# Patient Record
Sex: Female | Born: 1950 | Race: White | Hispanic: No | Marital: Married | Smoking: Former smoker
Health system: Southern US, Community
[De-identification: ages and names within clinical notes are randomized; demographics above are authoritative.]

## PROBLEM LIST (undated history)

## (undated) DIAGNOSIS — I255 Ischemic cardiomyopathy: Secondary | ICD-10-CM

## (undated) DIAGNOSIS — N189 Chronic kidney disease, unspecified: Secondary | ICD-10-CM

## (undated) DIAGNOSIS — E119 Type 2 diabetes mellitus without complications: Secondary | ICD-10-CM

## (undated) DIAGNOSIS — I10 Essential (primary) hypertension: Secondary | ICD-10-CM

## (undated) DIAGNOSIS — R0602 Shortness of breath: Secondary | ICD-10-CM

## (undated) DIAGNOSIS — J449 Chronic obstructive pulmonary disease, unspecified: Secondary | ICD-10-CM

## (undated) DIAGNOSIS — Z8489 Family history of other specified conditions: Secondary | ICD-10-CM

## (undated) DIAGNOSIS — D649 Anemia, unspecified: Secondary | ICD-10-CM

## (undated) DIAGNOSIS — I509 Heart failure, unspecified: Secondary | ICD-10-CM

## (undated) HISTORY — DX: Chronic obstructive pulmonary disease, unspecified: J44.9

## (undated) HISTORY — DX: Ischemic cardiomyopathy: I25.5

## (undated) HISTORY — DX: Chronic kidney disease, unspecified: N18.9

## (undated) HISTORY — PX: INCONTINENCE SURGERY: SHX676

## (undated) HISTORY — DX: Essential (primary) hypertension: I10

## (undated) HISTORY — DX: Type 2 diabetes mellitus without complications: E11.9

## (undated) HISTORY — DX: Anemia, unspecified: D64.9

---

## 2003-07-23 ENCOUNTER — Ambulatory Visit (HOSPITAL_COMMUNITY): Admission: RE | Admit: 2003-07-23 | Discharge: 2003-07-23 | Payer: Self-pay | Admitting: Orthopaedic Surgery

## 2003-07-23 ENCOUNTER — Ambulatory Visit (HOSPITAL_BASED_OUTPATIENT_CLINIC_OR_DEPARTMENT_OTHER): Admission: RE | Admit: 2003-07-23 | Discharge: 2003-07-23 | Payer: Self-pay | Admitting: Orthopaedic Surgery

## 2005-08-30 HISTORY — PX: KNEE CARTILAGE SURGERY: SHX688

## 2005-08-30 HISTORY — PX: VIDEO ASSISTED THORACOSCOPY (VATS)/ LYMPH NODE SAMPLING: SHX6170

## 2006-07-26 ENCOUNTER — Inpatient Hospital Stay (HOSPITAL_COMMUNITY): Admission: RE | Admit: 2006-07-26 | Discharge: 2006-07-30 | Payer: Self-pay | Admitting: Thoracic Surgery

## 2006-07-26 ENCOUNTER — Encounter (INDEPENDENT_AMBULATORY_CARE_PROVIDER_SITE_OTHER): Payer: Self-pay | Admitting: *Deleted

## 2006-08-10 ENCOUNTER — Encounter: Admission: RE | Admit: 2006-08-10 | Discharge: 2006-08-10 | Payer: Self-pay | Admitting: Thoracic Surgery

## 2006-08-14 ENCOUNTER — Inpatient Hospital Stay (HOSPITAL_COMMUNITY)
Admission: EM | Admit: 2006-08-14 | Discharge: 2006-08-19 | Payer: Self-pay | Admitting: Thoracic Surgery (Cardiothoracic Vascular Surgery)

## 2006-08-24 ENCOUNTER — Encounter: Admission: RE | Admit: 2006-08-24 | Discharge: 2006-08-24 | Payer: Self-pay | Admitting: Thoracic Surgery

## 2006-09-07 ENCOUNTER — Encounter: Admission: RE | Admit: 2006-09-07 | Discharge: 2006-09-07 | Payer: Self-pay | Admitting: Thoracic Surgery

## 2006-09-12 ENCOUNTER — Ambulatory Visit: Payer: Self-pay | Admitting: Pulmonary Disease

## 2006-09-28 ENCOUNTER — Encounter: Admission: RE | Admit: 2006-09-28 | Discharge: 2006-09-28 | Payer: Self-pay | Admitting: Thoracic Surgery

## 2006-11-23 ENCOUNTER — Ambulatory Visit: Payer: Self-pay | Admitting: Thoracic Surgery

## 2006-11-23 ENCOUNTER — Encounter: Admission: RE | Admit: 2006-11-23 | Discharge: 2006-11-23 | Payer: Self-pay | Admitting: Thoracic Surgery

## 2007-01-16 ENCOUNTER — Ambulatory Visit: Payer: Self-pay | Admitting: Pulmonary Disease

## 2007-01-27 ENCOUNTER — Ambulatory Visit: Payer: Self-pay | Admitting: Pulmonary Disease

## 2007-08-19 IMAGING — CR DG CHEST 2V
2 series · 2 of 2 positions shown · non-contrast
Comparison: 08/24/06.

CLINICAL DATA: Left sided chest pain.  Status post thoracoscopy.  
 PA AND LATERAL CHEST:

[w chest pa]
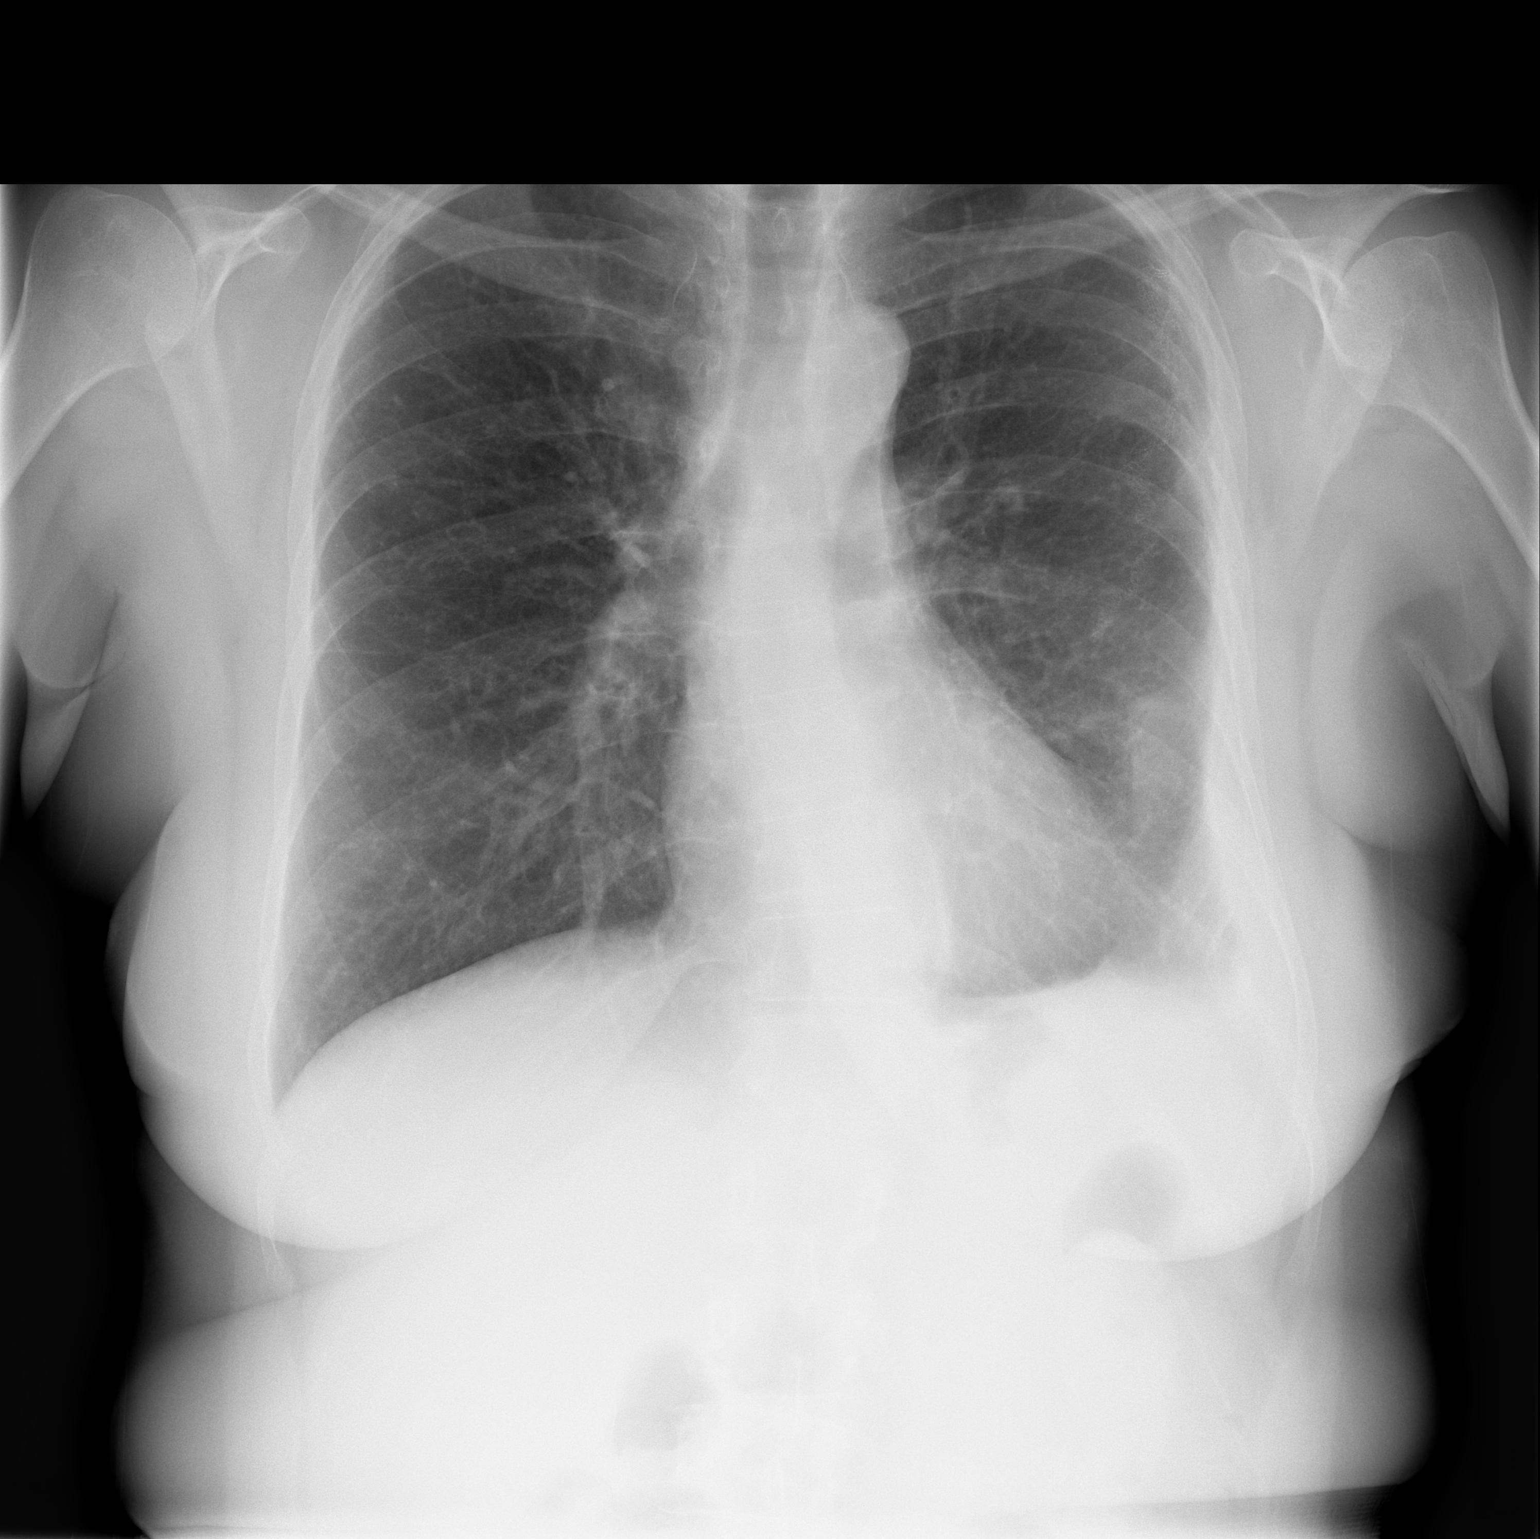

[w chest lat]
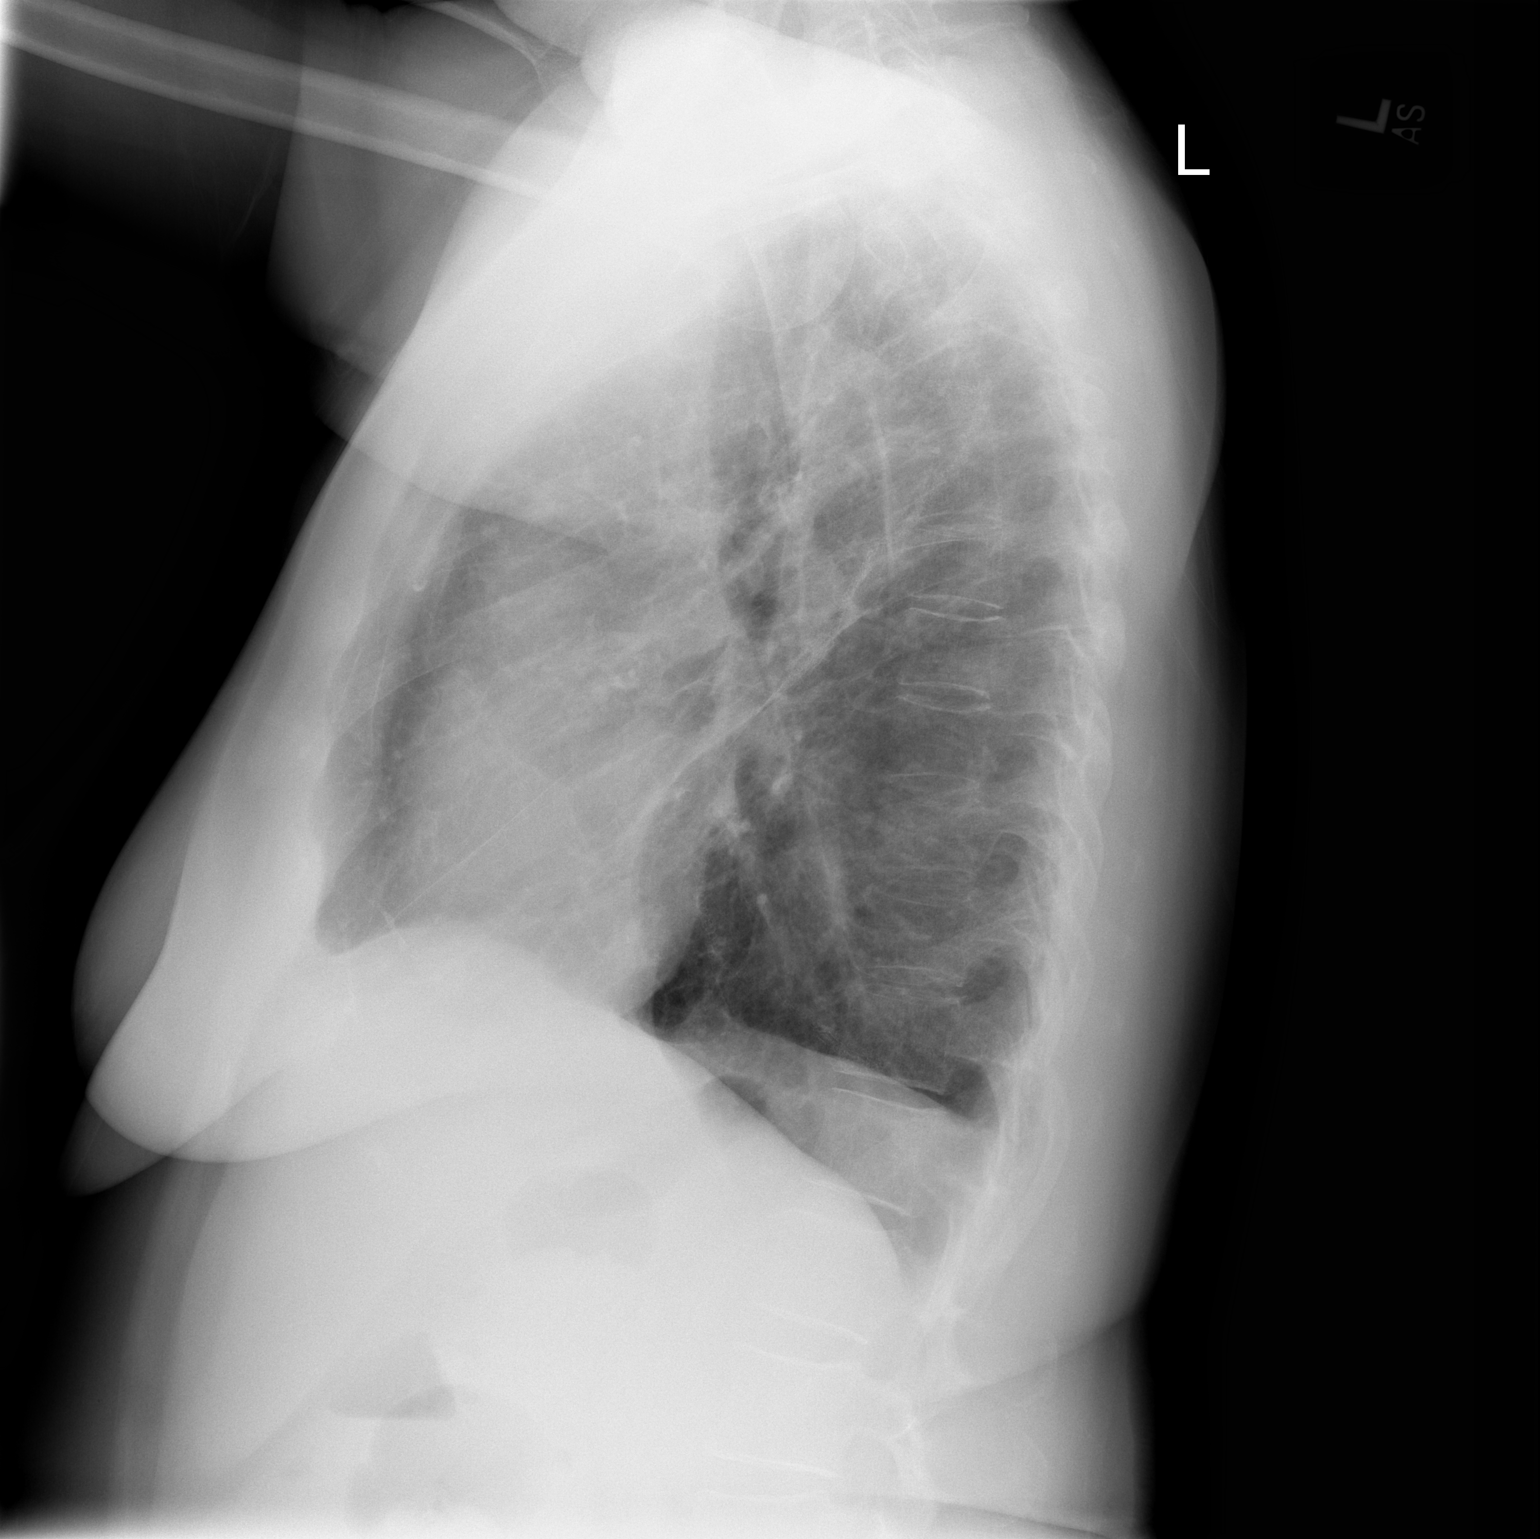

[2 of 2 positions shown; findings below may reference images not displayed]

Heart size and vascularity are normal.  There is decreased pleural thickening at the left lung base laterally.  The interstitial markings have improved.  Multiple surgical staples are seen in the left hemithorax from the previous lung surgery.  No pneumothorax or focal bony abnormality.  The right lung is clear.
IMPRESSION: No acute abnormality.  Decreased pleural thickening at the left lung base laterally.

## 2007-09-09 IMAGING — CR DG CHEST 2V
2 series · 2 of 2 positions shown · non-contrast
Comparison: 09/07/06

CLINICAL DATA: The patient has undergone thoracoscopy.
 CHEST - 2 VIEW:

[w chest pa]
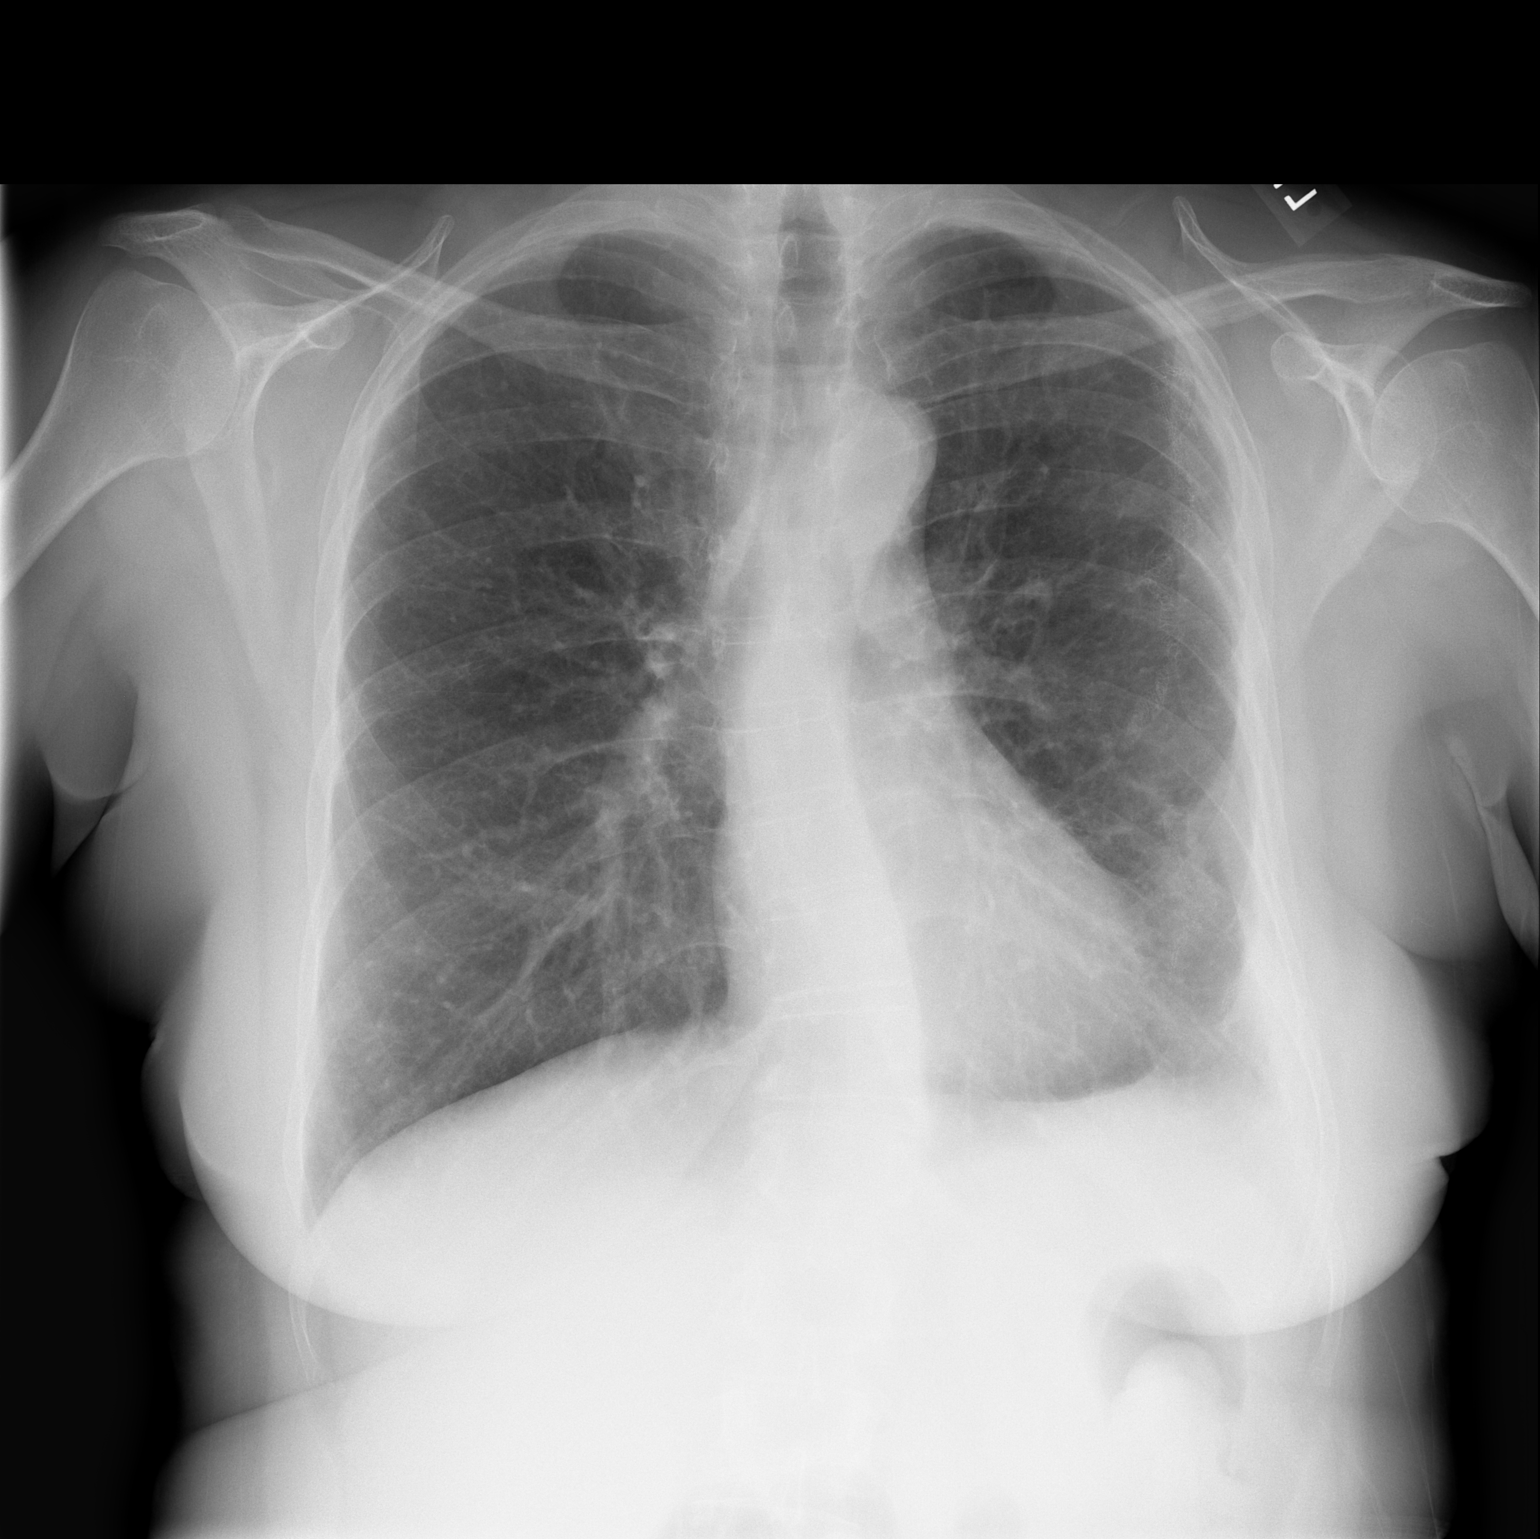

[w chest lat]
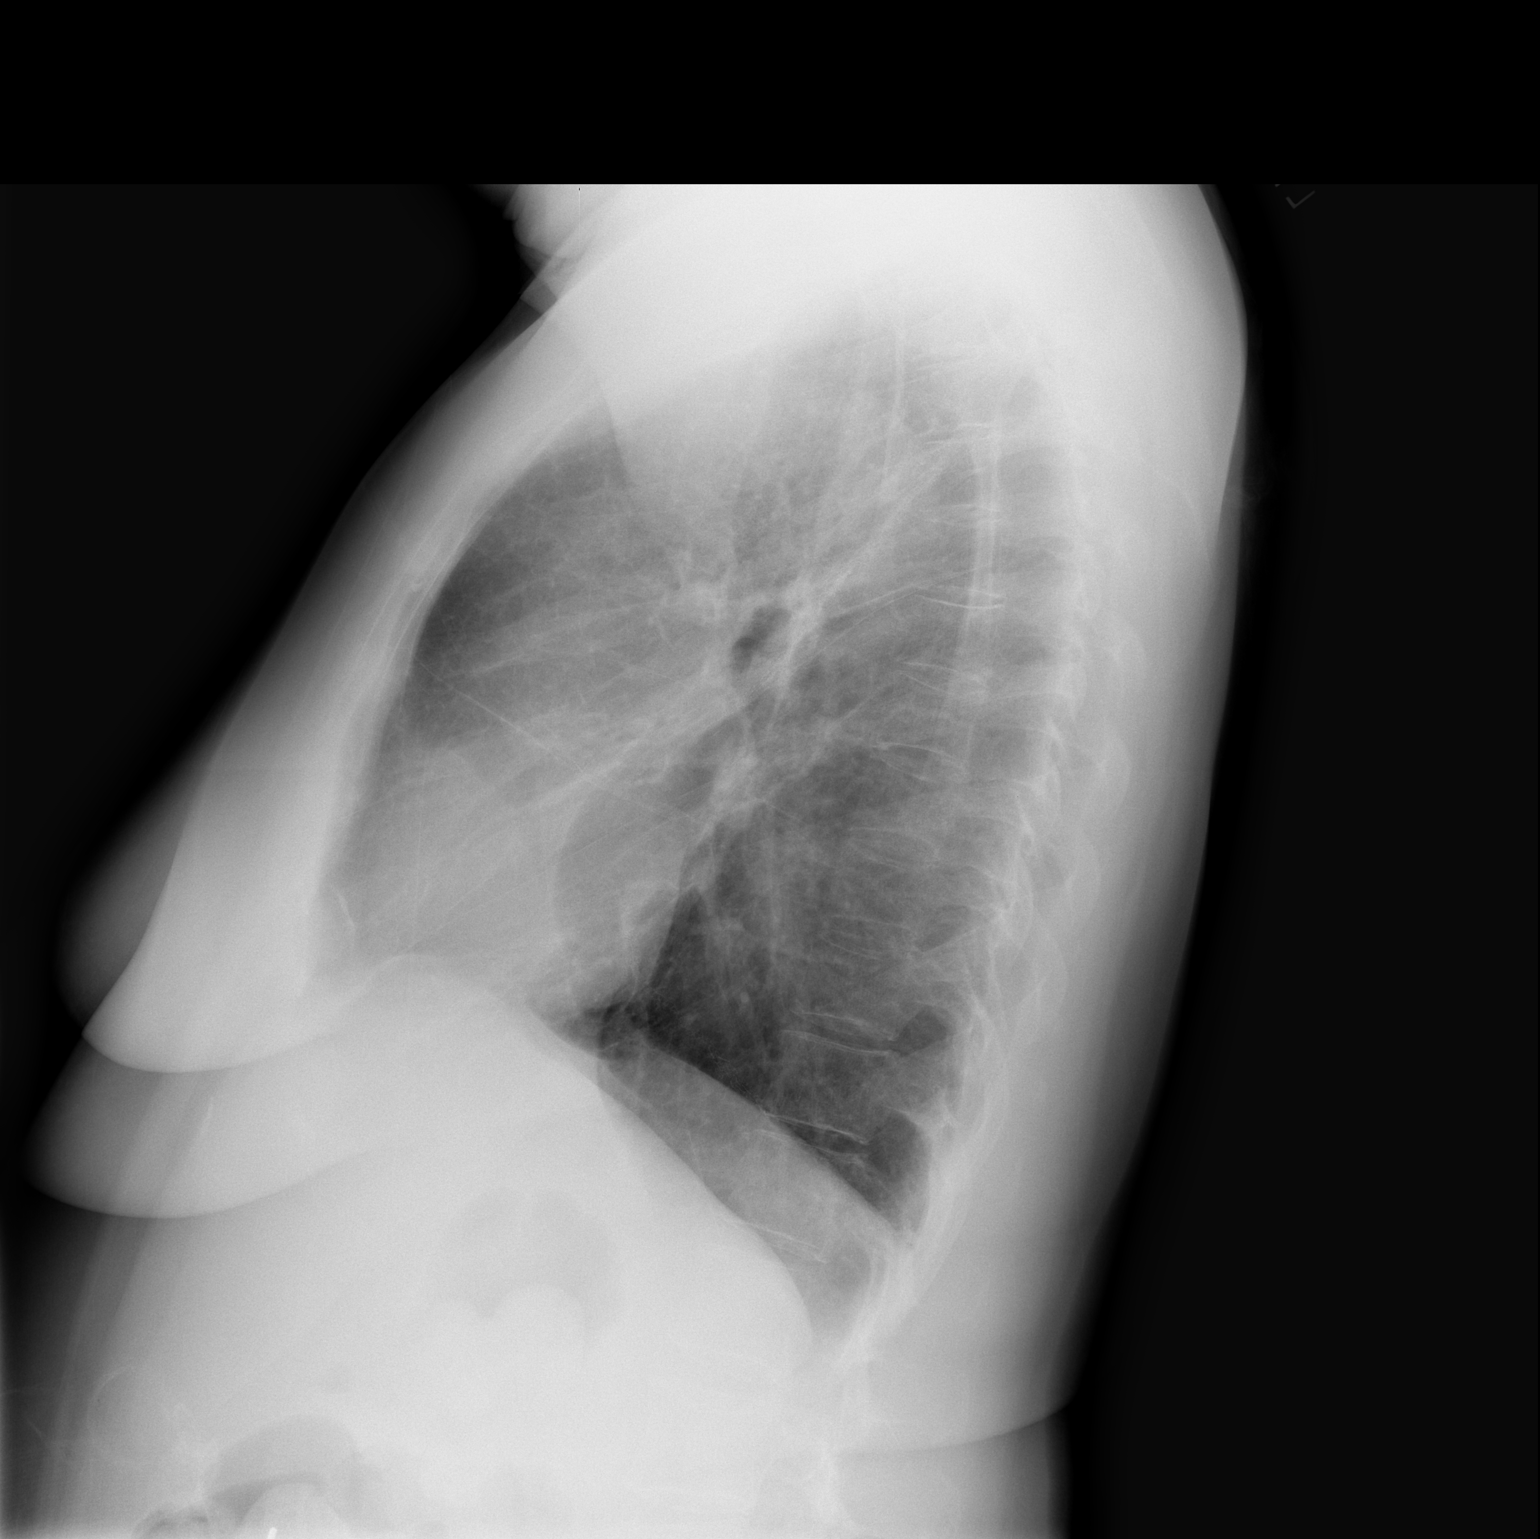

[2 of 2 positions shown; findings below may reference images not displayed]

FINDINGS: Post surgical changes left hemithorax with partial lung resection and scarring left lung base.  The right lung is clear.  The heart is normal.  Mediastinum and hila are negative for adenopathy.  Osseous structures unremarkable.
IMPRESSION: Stable chest.

## 2008-08-30 HISTORY — PX: CORONARY ARTERY BYPASS GRAFT: SHX141

## 2010-09-09 ENCOUNTER — Ambulatory Visit: Admit: 2010-09-09 | Payer: Self-pay

## 2010-09-20 ENCOUNTER — Encounter: Payer: Self-pay | Admitting: Thoracic Surgery

## 2011-01-12 NOTE — Assessment & Plan Note (Signed)
[facility]                             PULMONARY OFFICE NOTE   Kathleen [name], Kathleen [name]                        MRN:          [id number]  DATE:01/16/2007                            DOB:          Oct 16, 1950    SUBJECTIVE:  Kathleen [name] comes in today where she continues to have  difficulty with a cough and also shortness of breath at times. She is  also scheduled to have a dental extraction which may require twilight  sedation. 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 The  patient continues to have gastroesophageal reflux disease and is also on  lisinopril 5 mg daily in addition to all of her smoking. This certainly  could be the etiology for her cough.   PHYSICAL EXAMINATION:  VITAL SIGNS:  Blood pressure is 116/64, pulse  107, temperature is 97.9, weight is 163 pounds. O2 saturation on room  air is 97%.  CHEST:  Reveals rare rhonchi but no wheezes, otherwise it is clear.  CARDIAC:  Reveals a regular rate and rhythm.  EXTREMITIES:  Lower extremities are without significant edema.   IMPRESSION:  1. Respiratory bronchitis and interstitial lung disease secondary to      ongoing tobacco abuse. I have told the patient this will be a      progressive and debilitating disease if she does not stop smoking.      I have told her that her fate is clearly in her own hands at this      point in time since there is no specific treatment for this disease      except smoking cessation.  2. Cough that certainly is secondary to her ongoing smoking but I      would also wonder how much reflux disease and possibly her ACE      inhibitor may be playing a role.   PLAN:  1. Stop smoking.  2. I have asked the patient to talk with her primary care physician      about getting off lisinopril for a period of time to see if the      cough will improve.  3. Trial of increasing Protonix to 40 mg p.o. b.i.d. to see if that      will help with the cough.  4. From a pulmonary standpoint, she clearly is at increased risk for      perioperative pulmonary complications. I am somewhat concerned      about this patient having twilight anesthesia in an oral surgeon's      office and away from possible critical care backup support. I have      asked the patient to talk with her oral surgeon to see if this      could be done at an outpatient surgery center where there is backup      anesthesia support. She will talk with her and get back with      me.  5. The patient  will followup in 6 months or sooner if there are      problems.     Barbaraann Share, MD,FCCP  Electronically Signed    K[facility]/MedQ  DD: 01/16/2007  DT: 01/17/2007  Job #: [id number]   cc:   Janine Limbo, MD  Ines Bloomer, M.D.

## 2011-01-15 NOTE — Op Note (Signed)
Kathleen [name], FRANK                             ACCOUNT NO.:  [id number]   MEDICAL RECORD NO.:  [id number]                   PATIENT TYPE:  AMB   LOCATION:  DSC                                  FACILITY:  [facility]   PHYSICIAN:  Lubertha Basque. Jerl Santos, M.D.             DATE OF BIRTH:  Feb 01, 1951   DATE OF PROCEDURE:  07/23/2003  DATE OF DISCHARGE:                                 OPERATIVE REPORT   PREOPERATIVE DIAGNOSIS:  Left knee torn medial meniscus.   POSTOPERATIVE DIAGNOSIS:  Left knee torn medial meniscus.   PROCEDURE:  Left knee partial medial meniscectomy.   ANESTHESIA:  Knee block MAC.   SURGEON:  Lubertha Basque. Jerl Santos, M.D.   ASSISTANT:  Lindwood Qua, P.A.   INDICATIONS FOR PROCEDURE:  The patient is a 60 year old woman with about  one year of left knee pain and swelling. This has persisted despite oral  antiinflammatories and activity restriction. She did receive transient  relief with a cortisone injection. By MRI she had some degenerative signal  according to the radiologist although this looks like a posterior horn  medial meniscus tear to my reading. She is offered an arthroscopy as she has  pain at rest and pain with activity. Informed operative consent was obtained  after discussion of the possible complications of, reactions to anesthesia  and infection.   DESCRIPTION OF PROCEDURE:  The patient was taken to the operative suite  where a knee block and MAC were applied. She was positioned supine and  prepped and draped in the normal sterile fashion. After the administration  of preoperative IV antibiotics, an arthroscopy of the left knee was  performed through two inferior portals. The suprapatellar pouch was benign  as was the patellofemoral joint. The medial compartment exhibited some focal  grade three change dressed with a thorough chondroplasty but no bare bone  was exposed. She did have a degenerative tear of the posterior horn of the  medial meniscus. A 10%  partial medial meniscectomy was done back to stable  tissues. The lateral compartment was completely benign with no evidence of  meniscal or articular cartilage injury. The ACL appeared to be intact. The  knee was thoroughly irrigated at the end of the case followed by placement  of Marcaine with epinephrine and morphine. Adaptic was placed over the  portals followed by dry gauze and a loose ACE wrap. Estimated blood loss,  intraoperative fluids can be obtained from the anesthesia records.   DISPOSITION:  The patient was taken to the recovery room in stable  condition. Plans were for her to go home the same day and follow up in the  office __________ week. I will contact her by phone tonight.  Lubertha Basque Jerl Santos, M.D.    PGD/MEDQ  D:  07/23/2003  T:  07/23/2003  Job:  [id number]

## 2011-01-15 NOTE — H&P (Signed)
Kathleen [name], Kathleen [name]                 ACCOUNT NO.:  [id number]   MEDICAL RECORD NO.:  [id number]          PATIENT TYPE:  INP   LOCATION:  2306                         FACILITY:  [facility]   PHYSICIAN:  Ines Bloomer, M.D. DATE OF BIRTH:  May 05, 1951   DATE OF ADMISSION:  08/14/2006  DATE OF DISCHARGE:                              HISTORY & PHYSICAL   CHIEF COMPLAINT:  Left chest wall pain.   HISTORY OF PRESENT ILLNESS:  This 60 year old patient had, on November  27, underwent a left VATs with lung biopsy x3 for bilateral pulmonary  infiltrate and fibrosis.  Biopsy results revealed respiratory  bronchiolitis with patchy pulmonary fibrosis.  She also has diabetes and  a history of tobacco abuse.  She went home without problem and was seen  in the office this week with one of the anterior trocar sites was  draining slightly.  Since being seen in the office, she had some  increasing pain, went to the emergency room at [facility] and  apparently had an elevated white count, a low grade temperature of 99-  100 and some clear drainage from this anterior trocar site.  CT scan was  obtained, which showed a small amount of infernal air and the  subcutaneous tissues around the trocar site.  Darel Hong was transferred here  for treatment.   PAST MEDICAL HISTORY:  Significant for:  1. Diabetes type 2.  2. Hypertension.  3. Tobacco abuse.   ALLERGIES:  SHE IS ALLERGIC TO SULFA.   MEDICATIONS:  Include:  1. Cymbalta 60 mg a day.  2. Protonix 40 mg a day.  3. Lisinopril 5 mg a day.  4. Novolin 5 units at night.  5. Novolin 70/30, 55 units at night.  6. She was on Tylox at home.  7. Imipramine 100 mg at night.   FAMILY HISTORY:  Recorded, no recent history and physical.   SOCIAL HISTORY:  Recorded, no recent history and physical.   REVIEW OF SYSTEMS:  Recorded, no recent history and physical.   PHYSICAL EXAMINATION:  VITAL SIGNS:  Blood pressure is 160/80.  Pulse  100.  Respirations were  18.  Temp is 99.3.  Her saturations were 98%.  GENERAL:  She is a well-developed, Caucasian female with a complaint of  left chest pain.  HEENT:  Head is atraumatic.  Eyes:  Pupils are equal and reactive to  light and accommodation.  Extraocular movements are normal.  Ears:  Tympanic membranes are intact.  Nose:  There is no septal deviation.  Mouth:  Without lesion.  NECK:  Supple without thyromegaly or supraclavicular adenopathy.  CHEST:  Clear bilaterally.  HEART:  Regular sinus rhythm with some tachycardia, but no murmurs.  Her  left chest, there is 3 trocar sites.  The posterior and inferior one are  well healed.  The anterior one is draining clear fluids that is more  serous in nature.  The track was opened enough to drain better and we  could palpate down to the ribs.  This has been packed and started on  daily dressing changes.  There  was minimal erythema.  ABDOMEN:  Soft.  No hepatosplenomegaly.  EXTREMITIES:  Pulses 2+.  There is no clubbing or edema.  NEUROLOGICAL:  She is oriented x3.  Sensory and motor are intact.  SKIN:  Without lesions.   IMPRESSION:  Possibly tube track infection.  Rule out respiratory  bronchiolitis.  Diabetes mellitus with elevated glucose.  History of  tobacco abuse, respiratory bronchiolitis with patchy interstitial  fibrosis on the recent biopsy.           ______________________________  Ines Bloomer, M.D.     DPB/MEDQ  D:  08/14/2006  T:  08/15/2006  Job:  [id number]

## 2011-01-15 NOTE — Discharge Summary (Signed)
Kathleen [name], [name]                 ACCOUNT NO.:  [id number]   MEDICAL RECORD NO.:  [id number]          PATIENT TYPE:  INP   LOCATION:  2021                         FACILITY:  [facility]   PHYSICIAN:  Jerold Coombe, P.A.DATE OF BIRTH:  [birth year]   DATE OF ADMISSION:  07/26/2006  DATE OF DISCHARGE:                               DISCHARGE SUMMARY   ADMISSION DIAGNOSIS:  Bilateral interstitial lung disease with pulmonary  fibrosis.   DISCHARGE/SECONDARY DIAGNOSIS:  1. Bilateral interstitial lung disease with pulmonary fibrosis status      post left lung biopsies (pathology pending at the time of this      dictation).  2. Insulin dependent diabetes mellitus, currently uncontrolled with      hemoglobin A1c of 10.0 this hospitalization.  3. Hypertension.  4. Tobacco abuse.  5. Left knee partial knee medial meniscectomy.  6. ALLERGY TO SULFA.  7. History of bladder sling in 2006.  8. History of pneumonia in February 2007.  9. History of fibromyalgia and irritable bowel syndrome.  And she is      lactose intolerant.   PROCEDURES:  July 26, 2006:  Left video assisted thorascopic surgery  for lung biopsy of her left upper lobe, left lingular, and left lower  lobe biopsies by Dr. Algis Downs. Karle Plumber.   BRIEF HISTORY:  Kathleen [name] is a 60 year old Caucasian female with long  history of smoking and was treated for pneumonia several times.  She  reports a chronic cough.  A CT scan was done in July which showed  questionable interstitial lung disease.  A follow up scan in October  2007 showed progressive possible interstitial lung disease.  She denied  hemoptysis, fever, chills, excessive sputum.  She was referred for  biopsy of the interstitial lung disease by her pulmonologist, Dr.  Esther Hardy in [place].   HOSPITAL COURSE:  On November 27th, Kathleen [name] was electively admitted to  [facility] and underwent left VATS for left lung biopsies.  She  was extubated neurologically  intact, and after short stay in recovery  unit, was taken to step down unit 3300.  One chest tube was inserted  intra operatively as well as an on cue device for pain management.  Her  insulin regimen was initially started at half dose, but over several  days, was ultimately returned to her home dose, and in fact, her  nighttime dose was increased to 55 units as her initial post op sugars  were running 200-300.  With insulin adjustment, her last sugars have  ranged from a low of 108 with her other sugars all above 200, except for  1 which was 275.  Postoperatively, Kathleen [name] remained hemodynamically  stable.  She was ultimately weaned from supplemental oxygen and  saturated above 92%.  She remained afebrile.  Her ACE inhibitor therapy  was held during her hospitalization to monitor renal function, but this  also remained stable and she should be able to resume this at discharge.  On November 30th, blood pressure was stable 135/65, heart rate around 90-  100 and regular rhythm.  She  is afebrile and oxygen saturation 93% on  room air.  She is voiding without difficulty following removal of her  Foley catheter.  Her chest tube had been removed the day before and  follow up chest x-ray showed no pneumothorax with improved aeration.  Pathology results were still pending but lung cultures were negative to  date.  She did have some postoperative constipation treated with  Dulcolax but was without nausea or vomiting.  For pain, she initially  required morphine PCA for pain management but this was discontinued by  the morning of November 30th and so far, her pain has been controlled  with Oxycodone.  She has been ambulating independently.  Her incisions  appear to be healing well without signs of infection.  Her heart has a  regular rate and rhythm, lung sounds are coarse bilaterally, but she  shows no increased work of breathing.  Her abdominal exam is benign  other than decreased bowel  sounds.   LABORATORY DATA:  Her most recent labs show a white blood count of 12.9  which was trending down postoperatively.  Her hemoglobin 11.3,  hematocrit 33.0, platelet count 256, sodium 133, potassium 4.4, chloride  97, CO2 29, blood glucose 235, BUN 8, creatinine 0.7, total bilirubin  0.5, alkaline phosphatase 67, AST 27, ALT 25, total protein 6.6, blood  albumin 2.9, and calcium 10.2.  Her hemoglobin A1c is 10.0.   DISPOSITION:  Kathleen [name] does well off her morphine PCA and we will  consider discharging her home on postoperative day #3, July 29, 2006  or otherwise, we will plan discharging her home on December 1st in  stable condition.   DISCHARGE MEDICATIONS:  1. Tylox 1-2 tablets p.o. q.4 hours p.r.n. pain.  2. Cymbalta 60 mg p.o. q.h.s.  3. Protonix 40 mg p.o. q.h.s.  4. Lisinopril 5 mg p.o. q.h.s.  5. Albuterol inhaler p.r.n. per home regimen.  6. Imipramine 100 mg p.o. q.h.s.  7. Novolin 70/30 insulin 45 units subcutaneously q. a.m. and 55 units      subcutaneously q.p.m.   DISCHARGE INSTRUCTIONS:  She is to continue a diabetic appropriate diet.  She should continue at least twice daily Accu-Chek's.  She may shower  and clean her incision gently with soap and water and should notify the  CVTS office if she develops fever greater than 101, runniness or  drainage from her incision sites, or increased shortness of breath.  She  should avoid driving or heavy lifting for the next 2 weeks.  She is  encouraged to continue daily walking and breathing exercises and she is  to increase her activity slowly as tolerated.   FOLLOWUP:  She is to followup with Dr. Edwyna Shell at the CVTS office in  approximately 1 week and he will review her pathology results with her  at that time.  With a chest x-ray at [facility] 1 hour  before her appointment.  She should call and schedule regular followup with her family doctor regarding her diabetes management and to review  her  blood sugar record.      Jerold Coombe, P.A.     AWZ/MEDQ  D:  07/29/2006  T:  07/29/2006  Job:  [id number]   cc:   Ines Bloomer, M.D.  [name] Herrlich, MD  Esther Hardy, MD

## 2011-01-15 NOTE — Discharge Summary (Signed)
Kathleen [name], Kathleen [name]                 ACCOUNT NO.:  [id number]   MEDICAL RECORD NO.:  [id number]          PATIENT TYPE:  INP   LOCATION:  2017                         FACILITY:  [facility]   PHYSICIAN:  Ines Bloomer, M.D. DATE OF BIRTH:  1951-02-22   DATE OF ADMISSION:  08/14/2006  DATE OF DISCHARGE:  08/19/2006                               DISCHARGE SUMMARY   DATE OF ANTICIPATED DISCHARGE:  August 19, 2006.   PRIMARY ADMITTING DIAGNOSES:  Chest wall pain.   ADDITIONAL/DISCHARGE DIAGNOSES:  1. Status post left VATS on November 27 with lung biopsies.  2. Pulmonary fibrosis and respiratory bronchiolitis by pathology.  3. Infected left anterior chest tube site (methicillin-resistant      Staphylococcus aureus).  4. Type 2 insulin-dependent diabetes mellitus.  5. Hypertension.  6. Depression.  7. History of tobacco abuse.   HISTORY OF PRESENT ILLNESS:  The patient is a 60 year old female who was  status post a left VAT with lung biopsy on November 27 for bilateral  pulmonary infiltrates and pulmonary fibrosis. Her pathology was positive  for respiratory bronchiolitis with patchy pulmonary fibrosis.  Postoperative course was uneventful. She was discharged home without  difficulty. She was seen back in the office during the week prior to  this admission with drainage from bloody anterior chest tube site. It  was elected to treat her conservatively, and she will return home.  However, since being seen in the office she has had increasing pain in  the left chest wall and presented to the emergency department at  [facility] where she was found to have a low grade fever as well  as some drainage from the trocar site and elevated white blood cell  count. A CT scan was performed, and this showed a small amount of air in  the subcutaneous tissues around the chest tube site and in the chest  wall.  Because of these findings she was subsequently transferred to  [facility] for  evaluation by Dr. Edwyna Shell. He saw the patient and  felt that she should be admitted for further evaluation and treatment.   HOSPITAL COURSE:  Kathleen [name] was admitted, and cultures were taken from  the chest tube site. She was started empirically on vancomycin and  Unisom for broad-spectrum antibiotic coverage. The cultures ultimately  grew out MRSA, and her antibiotic coverage was switched to vancomycin  alone. On admission her white blood cell count was elevated at 17,000.  Since initiation of antibiotics, her  white blood cell count has  decreased and at present is 7000. She has remained afebrile since her  admission. The chest tube site itself is not read, and at present time  there is minimal drainage. She has remained stable, otherwise. Her blood  sugars are fairly well controlled on her home medications. Her most  recent chest x-ray in stable. Her lungs are clear with the exception of  expiratory wheezes bilaterally. Her labs on August 18, 2006 showed  hemoglobin of 9.2, hematocrit 27.5, platelets 395, white count 7.1.  Sodium 136, potassium 3.4 which has been supplemented, BUN 7,  creatinine  0.6. It is felt that if she continues to remain stable over the next 24  hours that she will hopefully be ready for discharge home with home  health nurse to be arranged to assist with IV antibiotics. A PICC line  has been placed, and antibiotics will be arranged for the following  week.   DISCHARGE MEDICATIONS:  1. Tylox 1-2 q.4-6 h. p.r.n. for pain.  2. Vancomycin per protocol.  3. Imipramine 100 mg q.h.s.  4. Cymbalta 60 mg q.h.s.  5. Protonix 40 mg q.h.s.  6. Lisinopril 5 mg q.h.s.  7. Asmanex 1 puff p.r.n.  8. Novolin 70/30 40 units q.a.m. and 50 units q.p.m.   DISCHARGE INSTRUCTIONS:  She is asked to refrain from driving, heavy  lifting or strenuous activity. She may continue ambulating daily and  using her incentive spirometer. She may shower daily and clean her  incisions with  soap and water. She will keep a dry dressing over the  chest tube site as long as it is draining. She will continue her same  preoperative diet.   DISCHARGE FOLLOWUP:  She will see Dr. Edwyna Shell back in the office on  December 26 at 11:30 a.m.Marland Kitchen She will have a chest x-ray at [facility] one hour prior to this appointment and bring her films to  our office for Dr. Edwyna Shell to review. Also home health nurse has been  arranged to assist with wound care and IV antibiotic administration. Our  office should be contacted immediately if there are any problems or  questions.      Coral Ceo, P.A.    ______________________________  Ines Bloomer, M.D.    GC/MEDQ  D:  08/18/2006  T:  08/18/2006  Job:  [id number]

## 2011-01-15 NOTE — Op Note (Signed)
[name], Kathleen                 ACCOUNT NO.:  [id number]   MEDICAL RECORD NO.:  [id number]          PATIENT TYPE:  INP   LOCATION:  2899                         FACILITY:  [facility]   PHYSICIAN:  Ines Bloomer, M.D. DATE OF BIRTH:  12/05/1950   DATE OF PROCEDURE:  07/26/2006  DATE OF DISCHARGE:                                 OPERATIVE REPORT   PREOPERATIVE DIAGNOSIS:  Bilateral interstitial lung disease with pulmonary  fibrosis.   POSTOPERATIVE DIAGNOSIS:  Pulmonary fibrosis.   OPERATION PERFORMED:  Left video-assisted thoracoscopic surgery, lung  biopsies see x3.   SURGEON:  Ines Bloomer, M.D.   FIRST ASSISTANT:  Jerold Coombe, P.A.   After general anesthesia, the patient was turned to the left lateral  thoracotomy position and was prepped and draped in the usual sterile manner.  A trocar site was made in the midaxillary line at the seventh intercostal  space, at the anterior axillary line at the sixth intercostal space, and the  midaxillary line at the fifth intercostal space.  Three trocars were  inserted.  The lung was deflated.  Pictures were taken.  You could see a lot  of anthracosis and scarring but no cobblestoning.  The lingula was grasped  and then using the EZ-45 stapler coming in from the lateral trocar site, it  was resected and sent for culture as well as sent for frozen section, which  revealed pulmonary fibrosis but minimal inflammation.  Another area was  grasped in the left upper lobe along the posterior segment with a Kaiser  ring forceps and then stapled with the Autosuture stapler and sent for  permanent section.  Finally an area and the medial basilar segment of the  right lower lobe was grasped with a Kaiser ring forceps and stapled with the  Autosuture stapler, coming in from the lateral trocar site, and we then  inspected each trocar site for a leak.  One required some CoSeal, 4 mL of  CoSeal.  A 28 chest tube was placed through the anterior  trocar sites.  A  Marcaine block was done in the usual fashion and a single On-Q was placed in  the usual fashion, and the other trocar sites were closed with 3-0 Vicryl  and Dermabond for the skin.  The patient turned to the recovery room in  stable condition.           ______________________________  Ines Bloomer, M.D.     DPB/MEDQ  D:  07/26/2006  T:  07/26/2006  Job:  [id number]

## 2011-01-15 NOTE — H&P (Signed)
Kathleen [name], Kathleen [name]                 ACCOUNT NO.:  [id number]   MEDICAL RECORD NO.:  [id number]          PATIENT TYPE:  INP   LOCATION:  3316                         FACILITY:  [facility]   PHYSICIAN:  Ines Bloomer, M.D. DATE OF BIRTH:  10-12-1950   DATE OF ADMISSION:  07/26/2006  DATE OF DISCHARGE:                                HISTORY & PHYSICAL   CHIEF COMPLAINT:  Shortness of breath.   HISTORY OF PRESENT ILLNESS:  This 60 year old patient had a long history of  smoking and was treated for pneumonia several times and after the pneumonia  developed a chronic cough.  A CT scan was done in July that showed  questionable interstitial lung disease.  Followup scan in October of 2007  showed progressive possible interstitial lung disease.  She had no  hemoptysis, fever, chills, excessive sputum, just chronic cough.  She is  referred for a biopsy of the interstitial lung disease by her pulmonologist,  Dr. __________ .   PAST MEDICAL HISTORY:  Significant for:  1. Diabetes type 2.  2. Hypertension.  3. Tobacco abuse.   ALLERGIES:  SHE IS ALLERGIC TO SULFA.   MEDICATIONS:  Include:  1. __________  100 mg at night.  2. Cymbalta 60 mg daily.  3. Protonix 40 mg daily.  4. Lisinopril 5 mg a day.  5. Novolin 70/30 45 units in the morning.  6. Novolin 70/30 50 units at night.  7. Albuterol inhaler p.r.n.   FAMILY HISTORY:  Negative for vascular disease and cancer.   SOCIAL HISTORY:  She is married, has one child, smokes at least a pack of  cigarettes a day, does not drink alcohol on a regular basis.   REVIEW OF SYSTEMS:  She is 179 pounds, she is 5'5.  CARDIAC:  She has been  told she has a heart murmur, __________  .  PULMONARY:  See the History of  Present Illness.  GI:  She has been treated for reflux and some chronic  abdominal pain.  GU:  No dysuria or frequent urination.  VASCULAR:  No  claudication, DVT or TIAs.  NEUROLOGICAL:  No headaches, blackout or  seizures.   MUSCULOSKELETAL:  She has some muscle pain and some joint pain.  PSYCHIATRIC:  She has been treated for depression and nervousness.  EYE/ENT:  No changes in her eye sight or hearing.  HEMATOLOGICAL:  No problems with  anemia or bleeding disorder.   PHYSICAL EXAMINATION:  GENERAL:  She is a well-developed Caucasian female in  no acute distress.  VITAL SIGNS:  Her blood pressure is 136/76, pulse 100,  sats are 95%, respirations are 18.  HEAD:  Atraumatic.  EYES:  Pupils equal and reactive to light and accommodation.  Extraocular  movements are normal.  EARS:  Tympanic membranes are intact.  NOSE:  No septal deviation.  THROAT:  Without lesion.  Neck is supple without thyromegaly, no carotid  bruits, no supraclavicular or axillary adenopathy.  CHEST:  Clear to auscultation and percussion.  HEART:  Regular sinus rhythm.  I did not hear any murmurs.  ABDOMEN:  Soft with no hepatosplenomegaly.  Bowel sounds are normal.  EXTREMITIES:  Pulses are 2+.  There is no clubbing or edema.  NEUROLOGICAL:  She is oriented x3.  Sensory and motor intact.  Cranial  nerves II-XII are intact.  SKIN:  Without lesion.   IMPRESSION:  1. Interstitial lung disease.  2. History of tobacco disease.  3. Diabetes type 2.  4. Hypertension.  5. History of reflux.   PLAN:  Left VATS lung biopsy x3.           ______________________________  Ines Bloomer, M.D.     DPB/MEDQ  D:  07/26/2006  T:  07/26/2006  Job:  [id number]

## 2013-04-06 ENCOUNTER — Other Ambulatory Visit: Payer: Self-pay | Admitting: *Deleted

## 2013-04-06 ENCOUNTER — Encounter: Payer: Self-pay | Admitting: Vascular Surgery

## 2013-04-06 DIAGNOSIS — N184 Chronic kidney disease, stage 4 (severe): Secondary | ICD-10-CM

## 2013-04-06 DIAGNOSIS — Z01810 Encounter for preprocedural cardiovascular examination: Secondary | ICD-10-CM

## 2013-05-01 ENCOUNTER — Ambulatory Visit: Payer: Self-pay | Admitting: Vascular Surgery

## 2013-10-07 DIAGNOSIS — N186 End stage renal disease: Secondary | ICD-10-CM | POA: Diagnosis present

## 2013-10-07 DIAGNOSIS — S72009A Fracture of unspecified part of neck of unspecified femur, initial encounter for closed fracture: Secondary | ICD-10-CM | POA: Insufficient documentation

## 2013-10-11 DIAGNOSIS — I509 Heart failure, unspecified: Secondary | ICD-10-CM | POA: Diagnosis present

## 2013-11-11 ENCOUNTER — Inpatient Hospital Stay (HOSPITAL_COMMUNITY)
Admission: EM | Admit: 2013-11-11 | Discharge: 2013-11-16 | DRG: 291 | Disposition: A | Discharge status: Home / Self Care | Payer: Medicare Other | Source: Other Acute Inpatient Hospital | Attending: Family Medicine | Admitting: Family Medicine

## 2013-11-11 ENCOUNTER — Encounter (HOSPITAL_COMMUNITY): Payer: Self-pay | Admitting: Internal Medicine

## 2013-11-11 DIAGNOSIS — I059 Rheumatic mitral valve disease, unspecified: Secondary | ICD-10-CM | POA: Diagnosis present

## 2013-11-11 DIAGNOSIS — J9601 Acute respiratory failure with hypoxia: Secondary | ICD-10-CM | POA: Diagnosis present

## 2013-11-11 DIAGNOSIS — I248 Other forms of acute ischemic heart disease: Secondary | ICD-10-CM | POA: Diagnosis present

## 2013-11-11 DIAGNOSIS — F29 Unspecified psychosis not due to a substance or known physiological condition: Secondary | ICD-10-CM | POA: Diagnosis not present

## 2013-11-11 DIAGNOSIS — F319 Bipolar disorder, unspecified: Secondary | ICD-10-CM | POA: Diagnosis present

## 2013-11-11 DIAGNOSIS — D638 Anemia in other chronic diseases classified elsewhere: Secondary | ICD-10-CM | POA: Diagnosis present

## 2013-11-11 DIAGNOSIS — J449 Chronic obstructive pulmonary disease, unspecified: Secondary | ICD-10-CM | POA: Diagnosis present

## 2013-11-11 DIAGNOSIS — Z951 Presence of aortocoronary bypass graft: Secondary | ICD-10-CM

## 2013-11-11 DIAGNOSIS — I2789 Other specified pulmonary heart diseases: Secondary | ICD-10-CM | POA: Diagnosis present

## 2013-11-11 DIAGNOSIS — Z87891 Personal history of nicotine dependence: Secondary | ICD-10-CM

## 2013-11-11 DIAGNOSIS — R9431 Abnormal electrocardiogram [ECG] [EKG]: Secondary | ICD-10-CM | POA: Diagnosis present

## 2013-11-11 DIAGNOSIS — I2510 Atherosclerotic heart disease of native coronary artery without angina pectoris: Secondary | ICD-10-CM | POA: Diagnosis present

## 2013-11-11 DIAGNOSIS — Z833 Family history of diabetes mellitus: Secondary | ICD-10-CM

## 2013-11-11 DIAGNOSIS — I2489 Other forms of acute ischemic heart disease: Secondary | ICD-10-CM | POA: Diagnosis present

## 2013-11-11 DIAGNOSIS — Z7982 Long term (current) use of aspirin: Secondary | ICD-10-CM

## 2013-11-11 DIAGNOSIS — Z515 Encounter for palliative care: Secondary | ICD-10-CM

## 2013-11-11 DIAGNOSIS — J8410 Pulmonary fibrosis, unspecified: Secondary | ICD-10-CM | POA: Diagnosis present

## 2013-11-11 DIAGNOSIS — J4489 Other specified chronic obstructive pulmonary disease: Secondary | ICD-10-CM | POA: Diagnosis present

## 2013-11-11 DIAGNOSIS — D631 Anemia in chronic kidney disease: Secondary | ICD-10-CM | POA: Diagnosis present

## 2013-11-11 DIAGNOSIS — Z66 Do not resuscitate: Secondary | ICD-10-CM | POA: Diagnosis not present

## 2013-11-11 DIAGNOSIS — Z8249 Family history of ischemic heart disease and other diseases of the circulatory system: Secondary | ICD-10-CM

## 2013-11-11 DIAGNOSIS — I255 Ischemic cardiomyopathy: Secondary | ICD-10-CM | POA: Diagnosis present

## 2013-11-11 DIAGNOSIS — E119 Type 2 diabetes mellitus without complications: Secondary | ICD-10-CM | POA: Diagnosis present

## 2013-11-11 DIAGNOSIS — J441 Chronic obstructive pulmonary disease with (acute) exacerbation: Secondary | ICD-10-CM | POA: Diagnosis not present

## 2013-11-11 DIAGNOSIS — I340 Nonrheumatic mitral (valve) insufficiency: Secondary | ICD-10-CM

## 2013-11-11 DIAGNOSIS — I5043 Acute on chronic combined systolic (congestive) and diastolic (congestive) heart failure: Principal | ICD-10-CM | POA: Diagnosis present

## 2013-11-11 DIAGNOSIS — I959 Hypotension, unspecified: Secondary | ICD-10-CM | POA: Diagnosis not present

## 2013-11-11 DIAGNOSIS — Z992 Dependence on renal dialysis: Secondary | ICD-10-CM

## 2013-11-11 DIAGNOSIS — J810 Acute pulmonary edema: Secondary | ICD-10-CM | POA: Diagnosis present

## 2013-11-11 DIAGNOSIS — E785 Hyperlipidemia, unspecified: Secondary | ICD-10-CM | POA: Diagnosis present

## 2013-11-11 DIAGNOSIS — J9600 Acute respiratory failure, unspecified whether with hypoxia or hypercapnia: Secondary | ICD-10-CM | POA: Diagnosis present

## 2013-11-11 DIAGNOSIS — I120 Hypertensive chronic kidney disease with stage 5 chronic kidney disease or end stage renal disease: Secondary | ICD-10-CM | POA: Diagnosis present

## 2013-11-11 DIAGNOSIS — F0390 Unspecified dementia without behavioral disturbance: Secondary | ICD-10-CM | POA: Diagnosis present

## 2013-11-11 DIAGNOSIS — N186 End stage renal disease: Secondary | ICD-10-CM | POA: Diagnosis present

## 2013-11-11 DIAGNOSIS — N039 Chronic nephritic syndrome with unspecified morphologic changes: Secondary | ICD-10-CM

## 2013-11-11 DIAGNOSIS — Z794 Long term (current) use of insulin: Secondary | ICD-10-CM

## 2013-11-11 DIAGNOSIS — R52 Pain, unspecified: Secondary | ICD-10-CM

## 2013-11-11 DIAGNOSIS — I509 Heart failure, unspecified: Secondary | ICD-10-CM | POA: Diagnosis present

## 2013-11-11 LAB — GLUCOSE, CAPILLARY
Glucose-Capillary: 162 mg/dL — ABNORMAL HIGH (ref 70–99)
Glucose-Capillary: 122 mg/dL — ABNORMAL HIGH (ref 70–99)
Glucose-Capillary: 142 mg/dL — ABNORMAL HIGH (ref 70–99)

## 2013-11-11 LAB — CBC
HCT: 32.2 % — ABNORMAL LOW (ref 36.0–46.0)
HEMOGLOBIN: 10.4 g/dL — AB (ref 12.0–15.0)
MCH: 29.3 pg (ref 26.0–34.0)
MCHC: 32.3 g/dL (ref 30.0–36.0)
MCV: 90.7 fL (ref 78.0–100.0)
PLATELETS: 309 10*3/uL (ref 150–400)
RBC: 3.55 MIL/uL — AB (ref 3.87–5.11)
RDW: 18.5 % — ABNORMAL HIGH (ref 11.5–15.5)
WBC: 8.7 10*3/uL (ref 4.0–10.5)

## 2013-11-11 LAB — HEPATIC FUNCTION PANEL
ALT: 13 U/L (ref 0–35)
AST: 33 U/L (ref 0–37)
Albumin: 2.6 g/dL — ABNORMAL LOW (ref 3.5–5.2)
Alkaline Phosphatase: 137 U/L — ABNORMAL HIGH (ref 39–117)
Bilirubin, Direct: 0.2 mg/dL (ref 0.0–0.3)
Indirect Bilirubin: 0.4 mg/dL (ref 0.3–0.9)
Total Bilirubin: 0.6 mg/dL (ref 0.3–1.2)
Total Protein: 7.3 g/dL (ref 6.0–8.3)

## 2013-11-11 LAB — HEMOGLOBIN A1C
Hgb A1c MFr Bld: 7 % — ABNORMAL HIGH (ref <5.7)
MEAN PLASMA GLUCOSE: 154 mg/dL — AB (ref <117)

## 2013-11-11 LAB — TROPONIN I: Troponin I: 0.44 ng/mL (ref <0.30)

## 2013-11-11 LAB — CREATININE, SERUM
Creatinine, Ser: 2.67 mg/dL — ABNORMAL HIGH (ref 0.50–1.10)
GFR calc non Af Amer: 18 mL/min — ABNORMAL LOW (ref >90)
GFR, EST AFRICAN AMERICAN: 21 mL/min — AB (ref >90)

## 2013-11-11 LAB — TSH: TSH: 0.708 u[IU]/mL (ref 0.350–4.500)

## 2013-11-11 LAB — MRSA PCR SCREENING: MRSA by PCR: NEGATIVE

## 2013-11-11 MED ORDER — ONDANSETRON HCL 4 MG/2ML IJ SOLN
4.0000 mg | Freq: Four times a day (QID) | INTRAMUSCULAR | Status: DC | PRN
  Administered 2013-11-14 – 2013-11-15 (×2): 4 mg via INTRAVENOUS
  Filled 2013-11-11 (×2): qty 2

## 2013-11-11 MED ORDER — MORPHINE SULFATE 2 MG/ML IJ SOLN
1.0000 mg | INTRAMUSCULAR | Status: DC | PRN
  Administered 2013-11-11 – 2013-11-15 (×5): 1 mg via INTRAVENOUS
  Filled 2013-11-11 (×3): qty 1

## 2013-11-11 MED ORDER — BUDESONIDE 0.25 MG/2ML IN SUSP
0.2500 mg | Freq: Two times a day (BID) | RESPIRATORY_TRACT | Status: DC
  Administered 2013-11-11 – 2013-11-15 (×5): 0.25 mg via RESPIRATORY_TRACT
  Filled 2013-11-11 (×13): qty 2

## 2013-11-11 MED ORDER — ACETAMINOPHEN 325 MG PO TABS: 650.0000 mg | ORAL_TABLET | Freq: Four times a day (QID) | ORAL | Status: DC | PRN

## 2013-11-11 MED ORDER — ASPIRIN EC 81 MG PO TBEC
81.0000 mg | DELAYED_RELEASE_TABLET | Freq: Every day | ORAL | Status: DC
  Administered 2013-11-11 – 2013-11-16 (×6): 81 mg via ORAL
  Filled 2013-11-11 (×6): qty 1

## 2013-11-11 MED ORDER — INSULIN ASPART 100 UNIT/ML ~~LOC~~ SOLN
0.0000 [IU] | Freq: Three times a day (TID) | SUBCUTANEOUS | Status: DC
  Administered 2013-11-11: 1 [IU] via SUBCUTANEOUS
  Administered 2013-11-13: 2 [IU] via SUBCUTANEOUS
  Administered 2013-11-13: 3 [IU] via SUBCUTANEOUS
  Administered 2013-11-13: 2 [IU] via SUBCUTANEOUS
  Administered 2013-11-15: 1 [IU] via SUBCUTANEOUS
  Administered 2013-11-15 – 2013-11-16 (×2): 2 [IU] via SUBCUTANEOUS

## 2013-11-11 MED ORDER — ACETAMINOPHEN 650 MG RE SUPP: 650.0000 mg | Freq: Four times a day (QID) | RECTAL | Status: DC | PRN

## 2013-11-11 MED ORDER — FERROUS SULFATE 325 (65 FE) MG PO TABS
325.0000 mg | ORAL_TABLET | Freq: Every day | ORAL | Status: DC
  Administered 2013-11-12 – 2013-11-14 (×3): 325 mg via ORAL
  Filled 2013-11-11 (×6): qty 1

## 2013-11-11 MED ORDER — IPRATROPIUM-ALBUTEROL 0.5-2.5 (3) MG/3ML IN SOLN
3.0000 mL | Freq: Four times a day (QID) | RESPIRATORY_TRACT | Status: DC | PRN
  Administered 2013-11-12: 3 mL via RESPIRATORY_TRACT
  Filled 2013-11-11: qty 3

## 2013-11-11 MED ORDER — CARVEDILOL 3.125 MG PO TABS
3.1250 mg | ORAL_TABLET | Freq: Two times a day (BID) | ORAL | Status: DC
  Administered 2013-11-11 – 2013-11-14 (×6): 3.125 mg via ORAL
  Filled 2013-11-11 (×8): qty 1

## 2013-11-11 MED ORDER — SODIUM CHLORIDE 0.9 % IJ SOLN
3.0000 mL | Freq: Two times a day (BID) | INTRAMUSCULAR | Status: DC
  Administered 2013-11-11 – 2013-11-16 (×10): 3 mL via INTRAVENOUS

## 2013-11-11 MED ORDER — INSULIN GLARGINE 100 UNIT/ML ~~LOC~~ SOLN
25.0000 [IU] | Freq: Two times a day (BID) | SUBCUTANEOUS | Status: DC
  Administered 2013-11-11 – 2013-11-16 (×11): 25 [IU] via SUBCUTANEOUS
  Filled 2013-11-11 (×12): qty 0.25

## 2013-11-11 MED ORDER — ONDANSETRON HCL 4 MG PO TABS: 4.0000 mg | ORAL_TABLET | Freq: Four times a day (QID) | ORAL | Status: DC | PRN

## 2013-11-11 MED ORDER — SODIUM CHLORIDE 0.9 % IV SOLN: 250.0000 mL | INTRAVENOUS | Status: DC | PRN

## 2013-11-11 MED ORDER — SODIUM CHLORIDE 0.9 % IJ SOLN: 3.0000 mL | INTRAMUSCULAR | Status: DC | PRN

## 2013-11-11 MED ORDER — PANTOPRAZOLE SODIUM 40 MG PO TBEC
40.0000 mg | DELAYED_RELEASE_TABLET | Freq: Every day | ORAL | Status: DC
  Administered 2013-11-11 – 2013-11-16 (×6): 40 mg via ORAL
  Filled 2013-11-11 (×7): qty 1

## 2013-11-11 MED ORDER — HEPARIN SODIUM (PORCINE) 5000 UNIT/ML IJ SOLN
5000.0000 [IU] | Freq: Three times a day (TID) | INTRAMUSCULAR | Status: DC
  Administered 2013-11-11 – 2013-11-16 (×15): 5000 [IU] via SUBCUTANEOUS
  Filled 2013-11-11 (×18): qty 1

## 2013-11-11 MED ORDER — LORAZEPAM 0.5 MG PO TABS
0.5000 mg | ORAL_TABLET | Freq: Two times a day (BID) | ORAL | Status: DC | PRN
  Administered 2013-11-12 – 2013-11-16 (×7): 0.5 mg via ORAL
  Filled 2013-11-11 (×4): qty 1

## 2013-11-11 NOTE — Progress Notes (Signed)
CRITICAL VALUE ALERT  Critical value received:  Trop =0.44  Date of notification:  11/11/2013   Time of notification:  1339  Critical value read back:yes  Nurse who received alert:   Mani Celestin, RN   MD notified (1st page):  Dr. Gwenlyn Perking  Time of first page:  0140  MD notified (2nd page):  Time of second page:  Responding MD:  Dr. Gwenlyn Perking  Time MD responded:  1340

## 2013-11-11 NOTE — Consult Note (Addendum)
Renal Service Consult Note [name]  Kathleen [name] 11/11/2013 Kathleen [name] Requesting Physician:  Dr Adela Glimpseoutova  Reason for Consult:  ESRD pt w acute resp distress, pulm edema HPI: The patient is a 63 y.o. year-old with hx of DM, HTN, ICM and ESRD on hemodialysis, she [facility] HD last [facility] Oct [facility]. She awoke this am with severe dyspnea and went to ER.  No CP or cough, no recent fevers. CXR showed pulm edema (not available) and she rec'[name] bipap, O2 and topical nitrates. [facility].  Now is breathing better.  Says she had to drink extra fluid because of low BS's yesterday.    ROS  no cp,  no skin rash   no jt pain  no hemoptysis  no fever, c/s  no abd pain, n/v/[name]  Past Medical History  Past Medical History  Diagnosis Date  . Chronic kidney disease   . Diabetes mellitus without complication   . Hypertension   . COPD (chronic obstructive pulmonary disease)   . Ischemic cardiomyopathy   . Anemia    Past Surgical History No past surgical history on file. Family History No family history on file. Social History  has no tobacco, alcohol, and drug history on file. Allergies  Allergies  Allergen Reactions  . Levaquin [Levofloxacin In D5w]   . Metformin And Related   . Other     Mycins allergy   . Sulfa Antibiotics   . Tobradex [Tobramycin-Dexamethasone]    Home medications Prior to Admission medications   Medication Sig Start Date End Date Taking? Authorizing Provider  aspirin 81 MG tablet Take 81 mg by mouth daily.    Historical Provider, MD  B COMPLEX-FOLIC ACID PO Take by mouth daily.    Historical Provider, MD  budesonide-formoterol (SYMBICORT) 160-4.5 MCG/ACT inhaler Inhale 2 puffs into the lungs 2 (two) times daily.    Historical Provider, MD  carvedilol (COREG) 3.125 MG tablet Take 3.125 mg by mouth 2 (two) times daily with a meal.    Historical Provider, MD  Cholecalciferol (VITAMIN [name]) 2000 UNITS tablet Take 2,000 Units  by mouth daily.    Historical Provider, MD  epoetin alfa (EPOGEN,PROCRIT) 1610910000 UNIT/ML injection Inject 10,000 Units into the skin once a week.    Historical Provider, MD  ferrous sulfate 325 (65 FE) MG tablet Take 325 mg by mouth daily with breakfast.    Historical Provider, MD  furosemide (LASIX) 80 MG tablet Take 80 mg by mouth 2 (two) times daily.    Historical Provider, MD  glipiZIDE (GLUCOTROL) 10 MG tablet Take 10 mg by mouth 2 (two) times daily before a meal.    Historical Provider, MD  imipramine (TOFRANIL) 50 MG tablet Take 50 mg by mouth at bedtime. Take two at bedtime    Historical Provider, MD  insulin NPH-regular (NOVOLIN 70/30) (70-30) 100 UNIT/ML injection Inject 100 Units into the skin 2 (two) times daily.    Historical Provider, MD  LORazepam (ATIVAN) 0.5 MG tablet Take 0.5 mg by mouth 2 (two) times daily as needed for anxiety.    Historical Provider, MD  nitroGLYCERIN (NITROSTAT) 0.4 MG SL tablet Place 0.4 mg under the tongue every 5 (five) minutes as needed for chest pain.    Historical Provider, MD   Liver Function Tests No results found for this basename: AST, ALT, ALKPHOS, BILITOT, PROT, ALBUMIN,  in the last 168 hours No results found for this basename: LIPASE, AMYLASE,  in the last  168 hours CBC No results found for this basename: WBC, NEUTROABS, HGB, HCT, MCV, PLT,  in the last 168 hours Basic Metabolic Panel No results found for this basename: NA, K, CL, CO2, GLUCOSE, BUN, CREATININE, ALB, CALCIUM, PHOS,  in the last 168 hours  Filed Vitals:   11/11/13 0915 11/11/13 0945 11/11/13 1000 11/11/13 1015  BP: 130/81 137/84 125/72 134/64  Pulse:  103 103 101  Resp: 17 17 19 17   SpO2:  97% 96% 99%   Exam: Adult female on bipap, alert and responsive, not in distress No rash, cyanosis or gangrene Sclera anicteric No jvd Chest bibasilar insp coarse rales RRR sustained PMI w S3, no MR Abd soft, nt, nd, no ascites 1-2+ bilat pretibial/pedal edema L>R Neuro is nf,  ox3  CXR none ECHO none in chart   Dialysis: MWF [place]  Assessment: 1 Acute resp failure / pulm edema- per ER MD cxr c/w edema, no film in system now. She has volume excess with LE edema, +S3, hx of CAD.  Plan acute HD today for volume control.  2 ESRD on hemodialysis 3 CAD s/p CABG 4 COPD 5 Ischemic CM- on BB 6 HTN on coreg only 7 DM on insulin, glipizide   Plan- HD today, and again tomorrow, decrease volume. Get HD records    Vinson Moselle MD (pgr) [phone]    ([phone] 11/11/2013, 10:31 AM

## 2013-11-11 NOTE — Procedures (Signed)
I was present at this dialysis session, have reviewed the session itself and made  appropriate changes  Vinson Moselle MD (pgr) [phone]    ([phone] 11/11/2013, 1:09 PM

## 2013-11-11 NOTE — H&P (Signed)
[facility] History and Physical  Kathleen [name] EMV:[id number] DOB: 1951-06-08 DOA: 11/11/2013  Referring physician: Dr. Earl Gala [facility]) PCP: No PCP Per Patient   Chief Complaint: SOB  HPI: Kathleen [name] is a 63 y.o. female with pmh of ischemic cardiomyopathy, ESRD, HTN, DM (on insulin); came to ED in [place] complaining of acute onset of SOB. Patient denies CP, fever, cough, HA's, melena, abd pain, N/V or any other complaints. Reports that breathing was worse when lying flat. In ED found to have pulmonary edema and arrangements for transferring were provided. Patient received BIPAP, NTG paste and was sent to [facility] for HD and further management.   Review of Systems:  no cp,  no skin rash  no jt pain  no hemoptysis  no fever, c/s  no abd pain, n/v/d   Past Medical History  Diagnosis Date  . Chronic kidney disease   . Diabetes mellitus without complication   . Hypertension   . COPD (chronic obstructive pulmonary disease)   . Ischemic cardiomyopathy   . Anemia    History reviewed. No pertinent past surgical history. Social History:  has no tobacco, alcohol, and drug history on file.  Allergies  Allergen Reactions  . Levaquin [Levofloxacin In D5w]   . Metformin And Related   . Other     Mycins allergy   . Sulfa Antibiotics   . Tobradex [Tobramycin-Dexamethasone]     Family Hx: Positive for DM, HLD and HTN  Prior to Admission medications   Medication Sig Start Date End Date Taking? Authorizing Provider  aspirin 81 MG tablet Take 81 mg by mouth daily.    Historical Provider, MD  B COMPLEX-FOLIC ACID PO Take by mouth daily.    Historical Provider, MD  budesonide-formoterol (SYMBICORT) 160-4.5 MCG/ACT inhaler Inhale 2 puffs into the lungs 2 (two) times daily.    Historical Provider, MD  carvedilol (COREG) 3.125 MG tablet Take 3.125 mg by mouth 2 (two) times daily with a meal.    Historical Provider, MD  Cholecalciferol (VITAMIN D) 2000 UNITS tablet Take  2,000 Units by mouth daily.    Historical Provider, MD  epoetin alfa (EPOGEN,PROCRIT) 53005 UNIT/ML injection Inject 10,000 Units into the skin once a week.    Historical Provider, MD  ferrous sulfate 325 (65 FE) MG tablet Take 325 mg by mouth daily with breakfast.    Historical Provider, MD  furosemide (LASIX) 80 MG tablet Take 80 mg by mouth 2 (two) times daily.    Historical Provider, MD  glipiZIDE (GLUCOTROL) 10 MG tablet Take 10 mg by mouth 2 (two) times daily before a meal.    Historical Provider, MD  imipramine (TOFRANIL) 50 MG tablet Take 50 mg by mouth at bedtime. Take two at bedtime    Historical Provider, MD  insulin NPH-regular (NOVOLIN 70/30) (70-30) 100 UNIT/ML injection Inject 100 Units into the skin 2 (two) times daily.    Historical Provider, MD  LORazepam (ATIVAN) 0.5 MG tablet Take 0.5 mg by mouth 2 (two) times daily as needed for anxiety.    Historical Provider, MD  nitroGLYCERIN (NITROSTAT) 0.4 MG SL tablet Place 0.4 mg under the tongue every 5 (five) minutes as needed for chest pain.    Historical Provider, MD   Physical Exam: Filed Vitals:   11/11/13 1015  BP: 134/64  Pulse: 101  Resp: 17    BP 134/64  Pulse 101  Resp 17  SpO2 99%  General:  Appears comfortable; wearing BIPAP and feeling better/breathing easier.  Eyes: PERRL, normal lids, irises & conjunctiva ENT: grossly normal hearing, lips & tongue Neck: no LAD, masses or thyromegaly; mild JVD Cardiovascular: RRR, no m/g; positive S3. 2-3++ Le edema. Telemetry: SR, no arrhythmias  Respiratory: bibasilar rales; slight exp wheezing; fair air movement. Abdomen: soft, nt, nd, positive BS Skin: no rash or induration seen; healing scar on her right hip  Musculoskeletal: grossly normal tone BUE/BLE, no joint swelling Psychiatric: grossly normal mood and affect, speech fluent and appropriate Neurologic: grossly non-focal.          Labs on Admission:  Pending   Radiological Exams on Admission: Pending in  house [facility] and [facility]  EKG:  pending  Assessment/Plan 1-Acute respiratory failure with hypoxia: secondary to CHF exacerbation and pulmonary edema. Patient ESRD -will admit to step down -PRN bipap -Renal service consulted for immediate HD -continue b-blockers, continue ASA -will check CXR and 2-De cho -will cycle CE'z  2-Acute pulmonary edema: as mentioned above. -volume control with HD  3-ESRD on dialysis: M-W-F.  -renal service consulted; plan is for HD today and tomorrow (3/15-3/16)  4-Obstructive chronic bronchitis without exacerbation: no exacerbation appreciated. SOB due to plumonary edema and CHF -continue pulmicort and PRN duonebs  5-Diabetes: will check A1C -holding glipizide -SSI and levemir ordered  6-Cardiomyopathy, ischemic: no CP. -will continue ASA and B-blocker -volume controlled by HD -will cycle troponin and check 2-D echo  7-Anemia of chronic disease: due to renal failure. -will follow renal rec's regarding use of epogen -CBC to monitor Hgb in am  8-GERD: PPI  DVT: heparin   Renal (Dr. Arlean HoppingSchertz) Code Status: full Family Communication: no family at bedside  Disposition Plan: inpatient, LOS > 2 midnights, stepdown  Time spent:50 minutes  Armen PickupManzueta, Dquan Cortopassi E Sonji Starkes [facility] Pager [phone]

## 2013-11-12 DIAGNOSIS — J9600 Acute respiratory failure, unspecified whether with hypoxia or hypercapnia: Secondary | ICD-10-CM

## 2013-11-12 DIAGNOSIS — E119 Type 2 diabetes mellitus without complications: Secondary | ICD-10-CM

## 2013-11-12 DIAGNOSIS — J810 Acute pulmonary edema: Secondary | ICD-10-CM

## 2013-11-12 DIAGNOSIS — N186 End stage renal disease: Secondary | ICD-10-CM

## 2013-11-12 DIAGNOSIS — J441 Chronic obstructive pulmonary disease with (acute) exacerbation: Secondary | ICD-10-CM

## 2013-11-12 DIAGNOSIS — I2589 Other forms of chronic ischemic heart disease: Secondary | ICD-10-CM

## 2013-11-12 LAB — CBC
HCT: 32.1 % — ABNORMAL LOW (ref 36.0–46.0)
Hemoglobin: 10.3 g/dL — ABNORMAL LOW (ref 12.0–15.0)
MCH: 29 pg (ref 26.0–34.0)
MCHC: 32.1 g/dL (ref 30.0–36.0)
MCV: 90.4 fL (ref 78.0–100.0)
Platelets: 301 10*3/uL (ref 150–400)
RBC: 3.55 MIL/uL — ABNORMAL LOW (ref 3.87–5.11)
RDW: 18.2 % — ABNORMAL HIGH (ref 11.5–15.5)
WBC: 6.4 10*3/uL (ref 4.0–10.5)

## 2013-11-12 LAB — BASIC METABOLIC PANEL
BUN: 27 mg/dL — AB (ref 6–23)
CHLORIDE: 96 meq/L (ref 96–112)
CO2: 29 meq/L (ref 19–32)
Calcium: 9.2 mg/dL (ref 8.4–10.5)
Creatinine, Ser: 2.23 mg/dL — ABNORMAL HIGH (ref 0.50–1.10)
GFR calc Af Amer: 26 mL/min — ABNORMAL LOW (ref >90)
GFR calc non Af Amer: 22 mL/min — ABNORMAL LOW (ref >90)
Glucose, Bld: 127 mg/dL — ABNORMAL HIGH (ref 70–99)
POTASSIUM: 5.3 meq/L (ref 3.7–5.3)
Sodium: 138 mEq/L (ref 137–147)

## 2013-11-12 LAB — GLUCOSE, CAPILLARY
GLUCOSE-CAPILLARY: 98 mg/dL (ref 70–99)
Glucose-Capillary: 87 mg/dL (ref 70–99)
Glucose-Capillary: 90 mg/dL (ref 70–99)
Glucose-Capillary: 157 mg/dL — ABNORMAL HIGH (ref 70–99)

## 2013-11-12 LAB — TROPONIN I: Troponin I: 0.32 ng/mL (ref <0.30)

## 2013-11-12 MED ORDER — NEPRO/CARBSTEADY PO LIQD: 237.0000 mL | ORAL | Status: DC | PRN

## 2013-11-12 MED ORDER — HEPARIN SODIUM (PORCINE) 1000 UNIT/ML DIALYSIS
2000.0000 [IU] | INTRAMUSCULAR | Status: DC | PRN
  Filled 2013-11-12: qty 2

## 2013-11-12 MED ORDER — SODIUM CHLORIDE 0.9 % IV SOLN: 100.0000 mL | INTRAVENOUS | Status: DC | PRN

## 2013-11-12 MED ORDER — PENTAFLUOROPROP-TETRAFLUOROETH EX AERO: 1.0000 "application " | INHALATION_SPRAY | CUTANEOUS | Status: DC | PRN

## 2013-11-12 MED ORDER — PREDNISONE 20 MG PO TABS
40.0000 mg | ORAL_TABLET | Freq: Two times a day (BID) | ORAL | Status: DC
  Administered 2013-11-12 – 2013-11-15 (×6): 40 mg via ORAL
  Filled 2013-11-12 (×9): qty 2

## 2013-11-12 MED ORDER — ALBUTEROL SULFATE (2.5 MG/3ML) 0.083% IN NEBU: 2.5000 mg | INHALATION_SOLUTION | RESPIRATORY_TRACT | Status: DC | PRN

## 2013-11-12 MED ORDER — LIDOCAINE HCL (PF) 1 % IJ SOLN: 5.0000 mL | INTRAMUSCULAR | Status: DC | PRN

## 2013-11-12 MED ORDER — IPRATROPIUM-ALBUTEROL 0.5-2.5 (3) MG/3ML IN SOLN
3.0000 mL | Freq: Four times a day (QID) | RESPIRATORY_TRACT | Status: DC
  Filled 2013-11-12: qty 3

## 2013-11-12 MED ORDER — HEPARIN SODIUM (PORCINE) 1000 UNIT/ML DIALYSIS
1000.0000 [IU] | INTRAMUSCULAR | Status: DC | PRN
  Filled 2013-11-12: qty 1

## 2013-11-12 MED ORDER — ALTEPLASE 2 MG IJ SOLR: 2.0000 mg | Freq: Once | INTRAMUSCULAR | Status: DC | PRN

## 2013-11-12 MED ORDER — LIDOCAINE-PRILOCAINE 2.5-2.5 % EX CREA: 1.0000 "application " | TOPICAL_CREAM | CUTANEOUS | Status: DC | PRN

## 2013-11-12 MED ORDER — LIDOCAINE-PRILOCAINE 2.5-2.5 % EX CREA
1.0000 "application " | TOPICAL_CREAM | CUTANEOUS | Status: DC | PRN
  Filled 2013-11-12: qty 5

## 2013-11-12 MED ORDER — ALTEPLASE 2 MG IJ SOLR
2.0000 mg | Freq: Once | INTRAMUSCULAR | Status: DC | PRN
  Filled 2013-11-12: qty 2

## 2013-11-12 MED ORDER — LORAZEPAM 0.5 MG PO TABS
ORAL_TABLET | ORAL | Status: AC
  Filled 2013-11-12: qty 1

## 2013-11-12 MED ORDER — HEPARIN SODIUM (PORCINE) 1000 UNIT/ML DIALYSIS: 2000.0000 [IU] | INTRAMUSCULAR | Status: DC | PRN

## 2013-11-12 MED ORDER — DOXYCYCLINE HYCLATE 100 MG PO TABS
100.0000 mg | ORAL_TABLET | Freq: Two times a day (BID) | ORAL | Status: DC
  Administered 2013-11-12 – 2013-11-15 (×7): 100 mg via ORAL
  Filled 2013-11-12 (×8): qty 1

## 2013-11-12 MED ORDER — LIDOCAINE HCL (PF) 1 % IJ SOLN
5.0000 mL | INTRAMUSCULAR | Status: DC | PRN
Start: 2013-11-12 — End: 2013-11-12

## 2013-11-12 NOTE — Progress Notes (Signed)
Called for report. RN unavailable at this time. RN will call back later. Gilman Schmidt

## 2013-11-12 NOTE — Procedures (Signed)
I was present at this dialysis session. I have reviewed the session itself and made appropriate changes.   11/09/13 Tx notes reviewed, left at 69.3kg with EDW 69kg. Weight this AM is 65.7kg after 3.5L UF yesterday, suspect non fluid weight loss occuring.  Pt wit improvement but still some dyspnea and productive cough.  Perhaps LRTI present as well.  Afebrile.   Goal UF today 3L   Outpt HD Orders Unit: [place] [facility] Days: MWF Time: 4h Dialyzer: F160 EDW: 69kg K/Ca: 2K/2Ca Access: TDC R IJ Needle Size: na BFR: 400 UF Proflie: none VDRA: none EPO: 6000 qTx IV Fe: none Heparin: 6000 IU qTx   Sabra Heck  MD 11/12/2013, 9:40 AM

## 2013-11-12 NOTE — Progress Notes (Signed)
CRITICAL VALUE ALERT  Critical value received:  Troponin 0.32  Date of notification:  11/12/2013   Time of notification:  2:56 AM   Critical value read back: yes  Nurse who received alert:  Birdena Crandall RN  MD notified (1st page):  Dr. Bretta Bang  Time of first page:  0256  MD notified (2nd page):  Time of second page:  Responding MD: Bretta Bang  Time MD responded:  2:58 AM

## 2013-11-12 NOTE — Progress Notes (Signed)
[facility] PROGRESS NOTE Interim History: 63 y.o. female with pmh of ischemic cardiomyopathy, ESRD, HTN, DM (on insulin); [place] in [place] complaining of acute onset of SOB. Patient denies CP, fever, cough, HA's, melena, abd pain, N/V or any other complaints. Reports that breathing was worse when lying flat. In ED found to have pulmonary edema and arrangements for transferring were provided. Patient received BIPAP, NTG paste and [facility] and further management.   Filed Weights   11/12/13 0352 11/12/13 0928 11/12/13 1145  Weight: 65.7 kg (144 lb 13.5 oz) 66.1 kg (145 lb 11.6 oz) 65.7 kg (144 lb 13.5 oz)        Intake/Output Summary (Last 24 hours) at 11/12/13 1503 Last data filed at 11/12/13 1400  Gross per 24 hour  Intake    380 ml  Output   3760 ml  Net  -3380 ml     Assessment/Plan: 1-Acute respiratory failure with hypoxia: secondary to CHF exacerbation and pulmonary edema. Also COPD and bronchitis contributing  -better and improved after HD on admission; will transfer to telemetry floor  -continue O2 supplementation -today patient with significant wheezing and rhonchi along with bibasilar crackles. Will continue HD for volume control. Continue low sodium diet/renal diet. Will start prednisone and abx's -patient to continue b-blockers, continue ASA  -will check CXR in am -follow 2-De cho  -troponin slightly elevated (most likely demand ischemia with pulmonary edema and CHF exacerbation)  2-Acute pulmonary edema: as mentioned above.  -volume control with HD  -continue low sodium diet  3-ESRD on dialysis: M-W-F.  -renal service consulted; plan is for HD today (3/16)  -will follow rec's  4-Obstructive chronic bronchitis with exacerbation:  -continue pulmicort and duonebs  -start doxycycline and prednisone -continue O2 supplementation and wean as tolerated  5-Diabetes:  -A1C 7.0 -holding glipizide  -continue SSI and levemir ordered; will  adjust as needed especially with initiation of prednisone  6-Cardiomyopathy, ischemic and elevated troponin: no CP reported.  -EKG w/o acute ischemic changes. -will continue ASA and B-blocker  -volume controlled by HD  -elevation in troponin marginal and most likely demand ischemia -follow 2-D echo  7-Anemia of chronic disease: due to renal failure.  -will follow renal rec's regarding use of epogen  -CBC to monitor Hgb in am   8-GERD: PPI  Code Status: Full Family Communication: no family at bedside Disposition Plan: transfer to telemetry bed   Consultants:  Renal service  Procedures: ECHO: pending   Antibiotics:  Doxycycline 3/16  HPI/Subjective: Feeling a lot better today; off BIPAP for 24 hours and with good O2 sat on [place] oxygen. Still with SOB, crackles and diffuse exp wheezing.  Objective: Filed Vitals:   11/12/13 1145 11/12/13 1340 11/12/13 1350 11/12/13 1400  BP: 104/63   122/67  Pulse: 94  104 102  Temp: 97.4 F (36.3 C) 98.3 F (36.8 C)    TempSrc: Oral Oral    Resp: 14  19 17   Height:      Weight: 65.7 kg (144 lb 13.5 oz)     SpO2: 95%  100% 100%     Exam:  General: Alert, awake, oriented x3, in mild distress due to SOB; but feeling a lot better.  HEENT: No bruits, no goiter. Mild JVD Heart: Regular rate and rhythm, no rub sor gallops; 2-3++ edema bilateral  Lungs: positive wheezing (diffuse Exp); positive rhonchi and bibasilar crackles Abdomen: Soft, nontender, nondistended, positive bowel sounds.  Neuro: Grossly intact, nonfocal.  Data Reviewed: Basic Metabolic Panel:  Recent Labs Lab 11/11/13 1234 11/12/13 0125  NA  --  138  K  --  5.3  CL  --  96  CO2  --  29  GLUCOSE  --  127*  BUN  --  27*  CREATININE 2.67* 2.23*  CALCIUM  --  9.2   Liver Function Tests:  Recent Labs Lab 11/11/13 1906  AST 33  ALT 13  ALKPHOS 137*  BILITOT 0.6  PROT 7.3  ALBUMIN 2.6*   CBC:  Recent Labs Lab 11/11/13 1234 11/12/13 0125  WBC  8.7 6.4  HGB 10.4* 10.3*  HCT 32.2* 32.1*  MCV 90.7 90.4  PLT 309 301   Cardiac Enzymes:  Recent Labs Lab 11/11/13 1234 11/11/13 1906 11/12/13 0125  TROPONINI 0.44* <0.30 0.32*   CBG:  Recent Labs Lab 11/11/13 1212 11/11/13 1628 11/11/13 2055 11/12/13 0823 11/12/13 1223  GLUCAP 162* 122* 142* 98 87    Recent Results (from the past 240 hour(s))  MRSA PCR SCREENING     Status: None   Collection Time    11/11/13 10:25 AM      Result Value Ref Range Status   MRSA by PCR NEGATIVE  NEGATIVE Final   Comment:            The GeneXpert MRSA Assay (FDA     approved for NASAL specimens     only), is one component of a     comprehensive MRSA colonization     surveillance program. It is not     intended to diagnose MRSA     infection nor to guide or     monitor treatment for     MRSA infections.     Studies: No results found.  Scheduled Meds: . aspirin EC  81 mg Oral Daily  . budesonide (PULMICORT) nebulizer solution  0.25 mg Nebulization BID  . carvedilol  3.125 mg Oral BID WC  . doxycycline  100 mg Oral Q12H  . ferrous sulfate  325 mg Oral Q breakfast  . heparin  5,000 Units Subcutaneous 3 times per day  . insulin aspart  0-9 Units Subcutaneous TID WC  . insulin glargine  25 Units Subcutaneous BID  . ipratropium-albuterol  3 mL Nebulization QID  . LORazepam      . pantoprazole  40 mg Oral Q1200  . predniSONE  40 mg Oral BID WC  . sodium chloride  3 mL Intravenous Q12H   Continuous Infusions:   Time: >30 minutes  Vassie Loll  [facility] Pager [phone]. If 8PM-8AM, please contact night-coverage at [email or web address], password [facility] 11/12/2013, 3:03 PM  LOS: 1 day

## 2013-11-13 ENCOUNTER — Inpatient Hospital Stay (HOSPITAL_COMMUNITY): Payer: Medicare Other

## 2013-11-13 ENCOUNTER — Encounter (HOSPITAL_COMMUNITY): Payer: Self-pay | Admitting: *Deleted

## 2013-11-13 DIAGNOSIS — I059 Rheumatic mitral valve disease, unspecified: Secondary | ICD-10-CM

## 2013-11-13 DIAGNOSIS — D638 Anemia in other chronic diseases classified elsewhere: Secondary | ICD-10-CM

## 2013-11-13 LAB — GLUCOSE, CAPILLARY
GLUCOSE-CAPILLARY: 168 mg/dL — AB (ref 70–99)
Glucose-Capillary: 206 mg/dL — ABNORMAL HIGH (ref 70–99)
Glucose-Capillary: 185 mg/dL — ABNORMAL HIGH (ref 70–99)
Glucose-Capillary: 184 mg/dL — ABNORMAL HIGH (ref 70–99)

## 2013-11-13 LAB — PHOSPHORUS: Phosphorus: 4 mg/dL (ref 2.3–4.6)

## 2013-11-13 MED ORDER — HYDRALAZINE HCL 10 MG PO TABS
10.0000 mg | ORAL_TABLET | Freq: Three times a day (TID) | ORAL | Status: DC
  Administered 2013-11-13 – 2013-11-16 (×6): 10 mg via ORAL
  Filled 2013-11-13 (×13): qty 1

## 2013-11-13 MED ORDER — DARBEPOETIN ALFA-POLYSORBATE 60 MCG/0.3ML IJ SOLN
60.0000 ug | INTRAMUSCULAR | Status: DC
  Administered 2013-11-14: 60 ug via INTRAVENOUS
  Filled 2013-11-13: qty 0.3

## 2013-11-13 MED ORDER — LEVALBUTEROL HCL 0.63 MG/3ML IN NEBU
0.6300 mg | INHALATION_SOLUTION | Freq: Four times a day (QID) | RESPIRATORY_TRACT | Status: DC | PRN
  Administered 2013-11-14: 0.63 mg via RESPIRATORY_TRACT
  Filled 2013-11-13: qty 3

## 2013-11-13 MED ORDER — ISOSORBIDE MONONITRATE 15 MG HALF TABLET
15.0000 mg | ORAL_TABLET | Freq: Every day | ORAL | Status: DC
  Administered 2013-11-14 – 2013-11-16 (×3): 15 mg via ORAL
  Filled 2013-11-13 (×3): qty 1

## 2013-11-13 MED ORDER — DOCUSATE SODIUM 100 MG PO CAPS
100.0000 mg | ORAL_CAPSULE | Freq: Two times a day (BID) | ORAL | Status: DC
  Administered 2013-11-13 – 2013-11-16 (×6): 100 mg via ORAL
  Filled 2013-11-13 (×9): qty 1

## 2013-11-13 NOTE — Progress Notes (Signed)
Clinical Social Work Department BRIEF PSYCHOSOCIAL ASSESSMENT 11/13/2013  Patient:  Kathleen [name], Kathleen [name]     Account Number:  [id number]     Admit date:  11/11/2013  Clinical Social Worker:  Varney Biles  Date/Time:  11/13/2013 09:47 AM  Referred by:  Physician  Date Referred:  11/13/2013 Referred for  SNF Placement   Other Referral:   Interview type:  Other - See comment Other interview type:   Received call from SNF    PSYCHOSOCIAL DATA Living Status:  FACILITY Admitted from facility:  [facility] Level of care:  Skilled Nursing Facility Primary support name:  Niclole Mcbath ([phone]) Primary support relationship to patient:   Degree of support available:   Unassessed.    CURRENT CONCERNS Current Concerns  Post-Acute Placement   Other Concerns:    SOCIAL WORK ASSESSMENT / PLAN I received a call from [facility] stating pt is from their facility. Administrator requested clinicals to update facility on pt's condition, and I uploaded progress notes and H&P for facility review.  I will addend & complete full assessment after speaking with pt/family.   Assessment/plan status:  Psychosocial Support/Ongoing Assessment of Needs Other assessment/ plan:   Information/referral to community resources:   SNF ([facility]).    PATIENT'S/FAMILY'S RESPONSE TO PLAN OF CARE: N/A       Maryclare Labrador, MSW, [facility] Clinical Social Worker [phone]

## 2013-11-13 NOTE — Progress Notes (Signed)
Report called to RN and pt transferred via wheelchair and NT to [phone]. Pt A & O and VS WNL.

## 2013-11-13 NOTE — Progress Notes (Signed)
  Echocardiogram 2D Echocardiogram has been performed.  Kerston Landeck, [facility] 11/13/2013, 11:35 AM

## 2013-11-13 NOTE — Consult Note (Signed)
CARDIOLOGY CONSULT NOTE   Patient ID: Kathleen [name] MRN: [id number], DOB/AGE: [birth year]   Admit date: 11/11/2013 Date of Consult: 11/13/2013   Primary Physician: No PCP Per Patient Primary Cardiologist: Dr. Dulce Sellar in [place]  Pt. Profile  63 year old woman with end-stage renal disease admitted on 11/11/13 with worsening dyspnea.  Problem List  Past Medical History  Diagnosis Date  . Chronic kidney disease   . Diabetes mellitus without complication   . Hypertension   . COPD (chronic obstructive pulmonary disease)   . Ischemic cardiomyopathy   . Anemia     History reviewed. No pertinent past surgical history.   Allergies  Allergies  Allergen Reactions  . Levaquin [Levofloxacin In D5w]   . Metformin And Related   . Other     Mycins allergy   . Sulfa Antibiotics   . Tobradex [Tobramycin-Dexamethasone]     HPI   This 63 year old woman has a history of end-stage renal disease, diabetes mellitus, hypertension, and ischemic cardiomyopathy.  She lives in [place] with her husband.  Her cardiologist there is Dr. Dulce Sellar.  She is not a good historian.  He states that her memory is not good.  She gives a history of gradually worsening dyspnea over the past several weeks.  She went to the emergency room in [facility] where chest x-ray showed pulmonary edema and she was transferred here for further management.  She does have a history of end-stage renal disease.  She also has a history of COPD and was a smoker until 2010.  She had CABG in 2010.  She does not recall which hospital she had her heart surgery in.  Review of her old [place] records indicate that she underwent VATS procedure by Dr. 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  She denies any recent chest pain.  She has had a cough productive of yellow sputum.  Home cardiac medications include baby aspirin, low-dose carvedilol, and Lasix.  Inpatient  Medications  . aspirin EC  81 mg Oral Daily  . budesonide (PULMICORT) nebulizer solution  0.25 mg Nebulization BID  . carvedilol  3.125 mg Oral BID WC  . [START ON 11/14/2013] darbepoetin (ARANESP) injection - DIALYSIS  60 mcg Intravenous Q Wed-HD  . docusate sodium  100 mg Oral BID  . doxycycline  100 mg Oral Q12H  . ferrous sulfate  325 mg Oral Q breakfast  . heparin  5,000 Units Subcutaneous 3 times per day  . insulin aspart  0-9 Units Subcutaneous TID WC  . insulin glargine  25 Units Subcutaneous BID  . pantoprazole  40 mg Oral Q1200  . predniSONE  40 mg Oral BID WC  . sodium chloride  3 mL Intravenous Q12H    Family History History reviewed. No pertinent family history.   Social History History   Social History  . Marital Status: Married    Spouse Name: N/A    Number of Children: N/A  . Years of Education: N/A   Occupational History  . Not on file.   Social History Main Topics  . Smoking status: Never Smoker   . Smokeless tobacco: Not on file  . Alcohol Use: Not on file  . Drug Use: Not on file  . Sexual Activity: Not on file   Other Topics Concern  . Not on file   Social History Narrative  . No narrative on file     Review of Systems  General:  No  chills, fever, night sweats or weight changes.  Cardiovascular: Recent worsening dyspnea orthopnea and peripheral edema. Dermatological: No rash, lesions/masses Respiratory: No positive for cough productive of yellow sputum Urologic: No hematuria, dysuria Abdominal:   No nausea, vomiting, diarrhea, bright red blood per rectum, melena, or hematemesis Neurologic:  No visual changes, wkns, changes in mental status.  Patient is mildly confused and has poor memory for recent and remote events All other systems reviewed and are otherwise negative except as noted above.  Physical Exam  Blood pressure 135/58, pulse 91, temperature 98 F (36.7 C), temperature source Oral, resp. rate 20, height 5\' 4"  (1.626 m), weight  143 lb 8.3 oz (65.1 kg), SpO2 100.00%.  General: Pleasant, NAD Psych: Normal affect. Neuro: Mildly confused. Moves all extremities spontaneously. HEENT: Normal  Neck: IV in left internal jugular.  No carotid bruits audible. Lungs: Bilateral inspiratory rales and rhonchi Heart: RRR grade 2/6 holosystolic apical murmur of mitral regurgitation Abdomen: Soft, non-tender, non-distended, BS + x 4.  Extremities: Moderate bilateral pretibial and pedal edema  Labs   Recent Labs  11/11/13 1234 11/11/13 1906 11/12/13 0125  TROPONINI 0.44* <0.30 0.32*   Lab Results  Component Value Date   WBC 6.4 11/12/2013   HGB 10.3* 11/12/2013   HCT 32.1* 11/12/2013   MCV 90.4 11/12/2013   PLT 301 11/12/2013     Recent Labs Lab 11/11/13 1906 11/12/13 0125  NA  --  138  K  --  5.3  CL  --  96  CO2  --  29  BUN  --  27*  CREATININE  --  2.23*  CALCIUM  --  9.2  PROT 7.3  --   BILITOT 0.6  --   ALKPHOS 137*  --   ALT 13  --   AST 33  --   GLUCOSE  --  127*   No results found for this basename: CHOL,  HDL,  LDLCALC,  TRIG   No results found for this basename: DDIMER    Radiology/Studies  Dg Chest 2 View  11/13/2013   CLINICAL DATA:  Short of breath, followup of pulmonary edema, former smoking history, dialysis patient  EXAM: CHEST  2 VIEW  COMPARISON:  Portable chest x-ray of 11/11/2013  FINDINGS: The lungs appear slightly better aerated. Chronic changes as well as probable superimposed pulmonary vascular congestion remain. Cardiomegaly is stable and a large bore central venous line remains.  IMPRESSION: Slightly better aeration. Chronic changes in addition to probable superimposed pulmonary vascular congestion.   Electronically Signed   By: Dwyane Dee M.D.   On: 11/13/2013 08:30    ECG Normal sinus rhythm Possible Left atrial enlargement Incomplete left bundle branch block Prolonged QT Abnormal ECG since last tracing no significant change  2-D echo - Left ventricle: The cavity size  was moderately dilated. Wall thickness was normal. Systolic function was severely reduced. The estimated ejection fraction was in the range of 20% to 25%. Diffuse hypokinesis. The study is not technically sufficient to allow evaluation of LV diastolic function. - Aortic valve: Mild to moderate regurgitation. - Mitral valve: Calcified annulus. Severe regurgitation. - Left atrium: The atrium was moderately dilated. - Right ventricle: Systolic function was moderately reduced. - Right atrium: The atrium was mildly dilated. - Pulmonary arteries: Systolic pressure was moderately increased. PA peak pressure: 27mm Hg (S).  ASSESSMENT AND PLAN  1. severe acute on chronic systolic heart failure secondary to ischemic cardiomyopathy and to severe mitral regurgitation. 2. pulmonary hypertension 3. ischemic  heart disease status post CABG 2010.  No recent angina.  Troponins are not impressive.  Slight elevation consistent with severe CHF. 4. end stage renal disease 5. COPD and pulmonary fibrosis 6. apparent dementia with poor memory 7. diabetes mellitus  Recommendation: Because of her multiple comorbidities the patient would be a poor candidate for repeat open heart surgery for mitral valve.  Some of her mitral regurgitation may be functional and secondary to her poor LV function and left ventricular dilatation.  We will continue medical treatment for her ischemic cardiomyopathy with beta blocker and add nitrates and hydralazine for preload and afterload reduction.  Volume control with hemodialysis.   Signed, Cassell Clementhomas Horst Ostermiller, MD  11/13/2013, 6:54 PM

## 2013-11-13 NOTE — Progress Notes (Signed)
Admit: 11/11/2013 LOS: 2  35F ESRD MWF [facility] via R IJ TDC admit with SOB  Subjective:  HD yesterday, unable to cont aggressive UF 2/2 hypotension, pt symptomatic Started prednisone, duonebs, doxycycline yesterday Pt reports some imp in resp symptoms Afebrile TTE ordered 3.5kg under outpt EDW   03/16 0701 - 03/17 0700 In: 393 [P.O.:390; I.V.:3] Out: 260 [Urine:1]  Filed Weights   11/12/13 0928 11/12/13 1145 11/13/13 0407  Weight: 66.1 kg (145 lb 11.6 oz) 65.7 kg (144 lb 13.5 oz) 65.1 kg (143 lb 8.3 oz)    Current meds: reviewed  Current Labs: reviewed   Outpt HD Orders  Unit: [facility] Days: MWF  Time: 4h  Dialyzer: F160  EDW: 69kg  K/Ca: 2K/2Ca  Access: TDC R IJ  Needle Size: na  BFR: 400  UF Proflie: none  VDRA: none  EPO: 6000 qTx  IV Fe: none  Heparin: 6000 IU qTx   Physical Exam:  Blood pressure 125/67, pulse 91, temperature 97.5 F (36.4 C), temperature source Oral, resp. rate 18, height 5\' 4"  (1.626 m), weight 65.1 kg (143 lb 8.3 oz), SpO2 99.00%. NAD Rhonchrous BS b/l wit h exp wheezing, nl wob NCAT, [place] in place RRR, no rub Trace-1+ pitting edema b/l No rashes/lesions R IJ TDC bandaged and c/d/i S/nt/nd.   Nonfocal  Assessment/Plan 1. ESRD: Cont on MWF schedule.   2. HTN/Vol: EDW of 65.5kg at next Tx.  Follow.  Was unable to challenge further 3/16 3. SOB: suspect bronchitis/COPD, PNA.  CXR obtained.  On ABX, duonebs, and pred per [facility].  TTE pending 4. Anemia: hb 10.3.  Cont ESA as darbepoeitn 60 qWed.   5. CKD-BMD: Check Phos 6. CAD s/p CABG 7. DM  Sabra Heck MD 11/13/2013, 8:03 AM   Recent Labs Lab 11/11/13 1234 11/12/13 0125  NA  --  138  K  --  5.3  CL  --  96  CO2  --  29  GLUCOSE  --  127*  BUN  --  27*  CREATININE 2.67* 2.23*  CALCIUM  --  9.2    Recent Labs Lab 11/11/13 1234 11/12/13 0125  WBC 8.7 6.4  HGB 10.4* 10.3*  HCT 32.2* 32.1*  MCV 90.7 90.4  PLT 309 301

## 2013-11-13 NOTE — Progress Notes (Signed)
[facility] PROGRESS NOTE Interim History: 63 y.o. female with pmh of ischemic cardiomyopathy, ESRD, HTN, DM (on insulin); came to ED in [place] complaining of acute onset of SOB. Patient denies CP, fever, cough, HA's, melena, abd pain, N/V or any other complaints. Reports that breathing was worse when lying flat. In ED found to have pulmonary edema and arrangements for transferring were provided. Patient received BIPAP, NTG paste and was sent to [facility] for HD and further management.   Filed Weights   11/12/13 0928 11/12/13 1145 11/13/13 0407  Weight: 66.1 kg (145 lb 11.6 oz) 65.7 kg (144 lb 13.5 oz) 65.1 kg (143 lb 8.3 oz)        Intake/Output Summary (Last 24 hours) at 11/13/13 1630 Last data filed at 11/13/13 0100  Gross per 24 hour  Intake    153 ml  Output      0 ml  Net    153 ml     Assessment/Plan: 1-Acute respiratory failure with hypoxia: secondary to CHF exacerbation and pulmonary edema. Also COPD and bronchitis contributing  -better and improved will transfer to telemetry floor  -continue O2 supplementation and wean slowly as toelrated -patient wheezing and air movement improved. Will continue steroids, antibiotics, pulmicort and PRN xopenex -patient to continue b-blockers, continue ASA  -CXR with improved aeration -2-D echo with EF 20-25% -troponin slightly elevated (most likely demand ischemia with pulmonary edema and CHF exacerbation) -cardiology consulted, will follow rec's  2-Acute pulmonary edema: as mentioned above.  -volume control with HD  -continue low sodium diet -CXR with improved aeration but still with vasc congestion  3-ESRD on dialysis: M-W-F.  -renal service consulted; plan is for HD tomorrow (3/18)  -will follow rec's  4-Obstructive chronic bronchitis with exacerbation:  -continue pulmicort and duonebs  -continue doxycycline and prednisone -continue O2 supplementation and wean as tolerated  5-Diabetes:  -A1C 7.0 -holding glipizide    -continue SSI and levemir ordered; will adjust as needed especially with use of prednisone  6-Cardiomyopathy, ischemic and elevated troponin: no CP reported.  -EKG w/o acute ischemic changes. -will continue ASA and B-blocker  -volume to be controlled by HD  -elevation in troponin marginal and most likely demand ischemia -follow 2-D echo showing EF 20-25 %; cardiology consulted (might need further evaluation given elevated troponin this admission and low EF; plus or minus ICD at some point)  7-Anemia of chronic disease: due to renal failure.  -will follow renal rec's regarding use of epogen  -Hgb 10.3  8-GERD: PPI  Code Status: Full Family Communication: no family at bedside Disposition Plan: transfer to telemetry bed   Consultants:  Renal service  Cardiology   Procedures: ECHO: 3/17 - Left ventricle: The cavity size was moderately dilated. Wall thickness was normal. Systolic function was severely reduced. The estimated ejection fraction was in the range of 20% to 25%. Diffuse hypokinesis. The study is not technically sufficient to allow evaluation of LV diastolic function. - Aortic valve: Mild to moderate regurgitation. - Mitral valve: Calcified annulus. Severe regurgitation. - Left atrium: The atrium was moderately dilated. - Right ventricle: Systolic function was moderately reduced. - Right atrium: The atrium was mildly dilated. - Pulmonary arteries: Systolic pressure was moderately  Antibiotics:  Doxycycline 3/16  HPI/Subjective: Feeling a lot better today; off BIPAP for 24 hours and with good O2 sat on [place] oxygen.  Reports breathing is improving.  Objective: Filed Vitals:   11/13/13 0407 11/13/13 0741 11/13/13 0837 11/13/13 1220  BP: 128/63 125/67 130/71 121/66  Pulse: 83 91 90 92  Temp: 97.6 F (36.4 C) 97.5 F (36.4 C)  98 F (36.7 C)  TempSrc: Oral Oral  Oral  Resp: Height:      Weight: 65.1 kg (143 lb 8.3 oz)     SpO2: 98% 99%  100%      Exam:  General: Alert, awake, oriented x3, in no acute distress and feeling better.  HEENT: No bruits, no goiter. Mild JVD Heart: Regular rate and rhythm, no rub sor gallops; 2-3++ edema bilateral  Lungs: improved exp wheezing; positive rhonchi and fine bibasilar crackles Abdomen: Soft, nontender, nondistended, positive bowel sounds.  Neuro: Grossly intact, nonfocal.   Data Reviewed: Basic Metabolic Panel:  Recent Labs Lab 11/11/13 1234 11/12/13 0125 11/13/13 0900  NA  --  138  --   K  --  5.3  --   CL  --  96  --   CO2  --  29  --   GLUCOSE  --  127*  --   BUN  --  27*  --   CREATININE 2.67* 2.23*  --   CALCIUM  --  9.2  --   PHOS  --   --  4.0   Liver Function Tests:  Recent Labs Lab 11/11/13 1906  AST 33  ALT 13  ALKPHOS 137*  BILITOT 0.6  PROT 7.3  ALBUMIN 2.6*   CBC:  Recent Labs Lab 11/11/13 1234 11/12/13 0125  WBC 8.7 6.4  HGB 10.4* 10.3*  HCT 32.2* 32.1*  MCV 90.7 90.4  PLT 309 301   Cardiac Enzymes:  Recent Labs Lab 11/11/13 1234 11/11/13 1906 11/12/13 0125  TROPONINI 0.44* <0.30 0.32*   CBG:  Recent Labs Lab 11/12/13 1626 11/12/13 2111 11/13/13 0744 11/13/13 1219 11/13/13 1612  GLUCAP 90 157* 206* 185* 168*    Recent Results (from the past 240 hour(s))  MRSA PCR SCREENING     Status: None   Collection Time    11/11/13 10:25 AM      Result Value Ref Range Status   MRSA by PCR NEGATIVE  NEGATIVE Final   Comment:            The GeneXpert MRSA Assay (FDA     approved for NASAL specimens     only), is one component of a     comprehensive MRSA colonization     surveillance program. It is not     intended to diagnose MRSA     infection nor to guide or     monitor treatment for     MRSA infections.     Studies: Dg Chest 2 View  11/13/2013   CLINICAL DATA:  Short of breath, followup of pulmonary edema, former smoking history, dialysis patient  EXAM: CHEST  2 VIEW  COMPARISON:  Portable chest x-ray of 11/11/2013   FINDINGS: The lungs appear slightly better aerated. Chronic changes as well as probable superimposed pulmonary vascular congestion remain. Cardiomegaly is stable and a large bore central venous line remains.  IMPRESSION: Slightly better aeration. Chronic changes in addition to probable superimposed pulmonary vascular congestion.   Electronically Signed   By: Dwyane Dee M.D.   On: 11/13/2013 08:30    Scheduled Meds: . aspirin EC  81 mg Oral Daily  . budesonide (PULMICORT) nebulizer solution  0.25 mg Nebulization BID  . carvedilol  3.125 mg Oral BID WC  . [START ON 11/14/2013] darbepoetin (ARANESP) injection - DIALYSIS  60 mcg Intravenous Q  Wed-HD  . docusate sodium  100 mg Oral BID  . doxycycline  100 mg Oral Q12H  . ferrous sulfate  325 mg Oral Q breakfast  . heparin  5,000 Units Subcutaneous 3 times per day  . insulin aspart  0-9 Units Subcutaneous TID WC  . insulin glargine  25 Units Subcutaneous BID  . pantoprazole  40 mg Oral Q1200  . predniSONE  40 mg Oral BID WC  . sodium chloride  3 mL Intravenous Q12H   Continuous Infusions:   Time: >30 minutes  Vassie LollMadera, Ainsleigh Kakos  [facility] Pager [phone]. If 8PM-8AM, please contact night-coverage at [email or web address], password [email or web address] 11/13/2013, 4:30 PM  LOS: 2 days

## 2013-11-14 DIAGNOSIS — I059 Rheumatic mitral valve disease, unspecified: Secondary | ICD-10-CM

## 2013-11-14 DIAGNOSIS — I5043 Acute on chronic combined systolic (congestive) and diastolic (congestive) heart failure: Secondary | ICD-10-CM | POA: Diagnosis present

## 2013-11-14 DIAGNOSIS — I340 Nonrheumatic mitral (valve) insufficiency: Secondary | ICD-10-CM | POA: Diagnosis present

## 2013-11-14 LAB — RENAL FUNCTION PANEL
Albumin: 2.7 g/dL — ABNORMAL LOW (ref 3.5–5.2)
BUN: 53 mg/dL — ABNORMAL HIGH (ref 6–23)
CO2: 27 meq/L (ref 19–32)
Calcium: 10.3 mg/dL (ref 8.4–10.5)
Chloride: 93 meq/L — ABNORMAL LOW (ref 96–112)
Creatinine, Ser: 2.8 mg/dL — ABNORMAL HIGH (ref 0.50–1.10)
GFR calc Af Amer: 19 mL/min — ABNORMAL LOW
GFR calc non Af Amer: 17 mL/min — ABNORMAL LOW
Glucose, Bld: 167 mg/dL — ABNORMAL HIGH (ref 70–99)
Phosphorus: 4.4 mg/dL (ref 2.3–4.6)
Potassium: 4.2 meq/L (ref 3.7–5.3)
Sodium: 134 meq/L — ABNORMAL LOW (ref 137–147)

## 2013-11-14 LAB — CBC
HCT: 33.5 % — ABNORMAL LOW (ref 36.0–46.0)
Hemoglobin: 10.5 g/dL — ABNORMAL LOW (ref 12.0–15.0)
MCH: 28.5 pg (ref 26.0–34.0)
MCHC: 31.3 g/dL (ref 30.0–36.0)
MCV: 91 fL (ref 78.0–100.0)
Platelets: 334 10*3/uL (ref 150–400)
RBC: 3.68 MIL/uL — AB (ref 3.87–5.11)
RDW: 18.3 % — ABNORMAL HIGH (ref 11.5–15.5)
WBC: 9 10*3/uL (ref 4.0–10.5)

## 2013-11-14 LAB — GLUCOSE, CAPILLARY
GLUCOSE-CAPILLARY: 132 mg/dL — AB (ref 70–99)
Glucose-Capillary: 100 mg/dL — ABNORMAL HIGH (ref 70–99)
Glucose-Capillary: 94 mg/dL (ref 70–99)

## 2013-11-14 MED ORDER — MORPHINE SULFATE 2 MG/ML IJ SOLN
INTRAMUSCULAR | Status: AC
  Administered 2013-11-14: 1 mg via INTRAVENOUS
  Filled 2013-11-14: qty 1

## 2013-11-14 MED ORDER — LORAZEPAM 0.5 MG PO TABS
ORAL_TABLET | ORAL | Status: AC
  Filled 2013-11-14: qty 1

## 2013-11-14 MED ORDER — HEPARIN SODIUM (PORCINE) 1000 UNIT/ML DIALYSIS
100.0000 [IU]/kg | INTRAMUSCULAR | Status: DC | PRN
  Administered 2013-11-14: 6500 [IU] via INTRAVENOUS_CENTRAL
  Filled 2013-11-14: qty 7

## 2013-11-14 MED ORDER — CARVEDILOL 6.25 MG PO TABS
6.2500 mg | ORAL_TABLET | Freq: Two times a day (BID) | ORAL | Status: DC
  Administered 2013-11-15 (×2): 6.25 mg via ORAL
  Filled 2013-11-14 (×5): qty 1

## 2013-11-14 MED ORDER — DARBEPOETIN ALFA-POLYSORBATE 60 MCG/0.3ML IJ SOLN
INTRAMUSCULAR | Status: AC
  Administered 2013-11-14: 60 ug via INTRAVENOUS
  Filled 2013-11-14: qty 0.3

## 2013-11-14 NOTE — Progress Notes (Signed)
Pt in dialysis at the time of his 08:00 neb tx.

## 2013-11-14 NOTE — Procedures (Signed)
I was present at this dialysis session. I have reviewed the session itself and made appropriate changes.   Still with some pulm symptoms.  Should achieve new EDW of 65.5kg.    Sabra Heck  MD 11/14/2013, 9:05 AM

## 2013-11-14 NOTE — Progress Notes (Signed)
[facility] PROGRESS NOTE  Kathleen [name] KGM:[id number] DOB: 1951-08-15 DOA: 11/11/2013 PCP: No PCP Per Patient Interim History:  63 y.o. female with pmh of ischemic cardiomyopathy, ESRD, HTN, DM (on insulin); [place] of acute onset of SOB. Patient denies CP, fever, cough, HA's, melena, abd pain, N/V or any other complaints. Reports that breathing was worse when lying flat. In ED found to have pulmonary edema and arrangements for transferring were provided. Patient received BIPAP, NTG paste and [facility]   Assessment/Plan: 1-Acute respiratory failure with hypoxia: secondary to CHF exacerbation and pulmonary edema. Also COPD and bronchitis contributing  -better and improved will transfer to telemetry floor  -continue O2 supplementation and wean slowly as tolerated  -patient wheezing and air movement improved. Will continue steroids, antibiotics, pulmicort and PRN xopenex  -patient to continue b-blockers, continue ASA  -CXR with improved aeration  -2-D echo with EF 20-25%  -troponin slightly elevated (most likely demand ischemia with pulmonary edema and CHF exacerbation)  -cardiology consulted, will follow rec's   2-Acute pulmonary edema: as mentioned above.  -volume control with HD  -continue low sodium diet  -CXR with improved aeration but still with vasc congestion   3-ESRD on dialysis: M-W-F.  -renal service consulted; plan is for HD (3/18)  -will follow rec's   4-Obstructive chronic bronchitis with exacerbation:  -continue pulmicort and duonebs  -continue doxycycline and prednisone  -continue O2 supplementation and wean as tolerated   5-Diabetes:  -A1C 7.0  -holding glipizide  -continue SSI and levemir ordered; will adjust as needed especially with use of prednisone   6-Cardiomyopathy, ischemic and elevated troponin: no CP reported.  -EKG w/o acute ischemic changes.  -will continue ASA and B-blocker   -volume to be controlled by HD  -elevation in troponin marginal and most likely demand ischemia  -follow 2-D echo showing EF 20-25 %; cardiology consulted (might need further evaluation given elevated troponin this admission and low EF; plus or minus ICD at some point)   7-Anemia of chronic disease: due to renal failure.  -will follow renal rec's regarding use of epogen  -Hgb 10.3   8-GERD: PPI   Code Status: Full  Family Communication: no family at bedside  Disposition Plan: transfer to telemetry bed    Consultants:  Cardiology  Nephrology  Procedures:  HD  Antibiotics:  Doxycycline  HPI/Subjective: No new complaints reported. No acute issues overnight.  States his breathing is improved  Objective: Filed Vitals:   11/14/13 1135  BP: 137/76  Pulse: 93  Temp: 98 F (36.7 C)  Resp: 18    Intake/Output Summary (Last 24 hours) at 11/14/13 1533 Last data filed at 11/14/13 1300  Gross per 24 hour  Intake    240 ml  Output   2449 ml  Net  -2209 ml   Filed Weights   11/13/13 2121 11/14/13 0702 11/14/13 1117  Weight: 67.314 kg (148 lb 6.4 oz) 67.7 kg (149 lb 4 oz) 65.5 kg (144 lb 6.4 oz)    Exam:   General:  Pt in NAD, alert and awake  Cardiovascular: Normal S1 and S2, no murmurs  Respiratory: [place] in place, decreased breath sounds at the bases  Abdomen: soft, obese, NT  Musculoskeletal: no cyanosis or clubbing   Data Reviewed: Basic Metabolic Panel:  Recent Labs Lab 11/11/13 1234 11/12/13 0125 11/13/13 0900 11/14/13 0715  NA  --  138  --  134*  K  --  5.3  --  4.2  CL  --  96  --  93*  CO2  --  29  --  27  GLUCOSE  --  127*  --  167*  BUN  --  27*  --  53*  CREATININE 2.67* 2.23*  --  2.80*  CALCIUM  --  9.2  --  10.3  PHOS  --   --  4.0 4.4   Liver Function Tests:  Recent Labs Lab 11/11/13 1906 11/14/13 0715  AST 33  --   ALT 13  --   ALKPHOS 137*  --   BILITOT 0.6  --   PROT 7.3  --   ALBUMIN 2.6* 2.7*   No results found for  this basename: LIPASE, AMYLASE,  in the last 168 hours No results found for this basename: AMMONIA,  in the last 168 hours CBC:  Recent Labs Lab 11/11/13 1234 11/12/13 0125 11/14/13 0715  WBC 8.7 6.4 9.0  HGB 10.4* 10.3* 10.5*  HCT 32.2* 32.1* 33.5*  MCV 90.7 90.4 91.0  PLT 309 301 334   Cardiac Enzymes:  Recent Labs Lab 11/11/13 1234 11/11/13 1906 11/12/13 0125  TROPONINI 0.44* <0.30 0.32*   BNP (last 3 results) No results found for this basename: PROBNP,  in the last 8760 hours CBG:  Recent Labs Lab 11/13/13 0744 11/13/13 1219 11/13/13 1612 11/13/13 2219 11/14/13 1134  GLUCAP 206* 185* 168* 184* 100*    Recent Results (from the past 240 hour(s))  MRSA PCR SCREENING     Status: None   Collection Time    11/11/13 10:25 AM      Result Value Ref Range Status   MRSA by PCR NEGATIVE  NEGATIVE Final   Comment:            The GeneXpert MRSA Assay (FDA     approved for NASAL specimens     only), is one component of a     comprehensive MRSA colonization     surveillance program. It is not     intended to diagnose MRSA     infection nor to guide or     monitor treatment for     MRSA infections.     Studies: Dg Chest 2 View  11/13/2013   CLINICAL DATA:  Short of breath, followup of pulmonary edema, former smoking history, dialysis patient  EXAM: CHEST  2 VIEW  COMPARISON:  Portable chest x-ray of 11/11/2013  FINDINGS: The lungs appear slightly better aerated. Chronic changes as well as probable superimposed pulmonary vascular congestion remain. Cardiomegaly is stable and a large bore central venous line remains.  IMPRESSION: Slightly better aeration. Chronic changes in addition to probable superimposed pulmonary vascular congestion.   Electronically Signed   By: Dwyane Dee M.D.   On: 11/13/2013 08:30    Scheduled Meds: . aspirin EC  81 mg Oral Daily  . budesonide (PULMICORT) nebulizer solution  0.25 mg Nebulization BID  . carvedilol  3.125 mg Oral BID WC  .  darbepoetin (ARANESP) injection - DIALYSIS  60 mcg Intravenous Q Wed-HD  . docusate sodium  100 mg Oral BID  . doxycycline  100 mg Oral Q12H  . ferrous sulfate  325 mg Oral Q breakfast  . heparin  5,000 Units Subcutaneous 3 times per day  . hydrALAZINE  10 mg Oral 3 times per day  . insulin aspart  0-9 Units Subcutaneous TID WC  . insulin glargine  25 Units Subcutaneous BID  . isosorbide mononitrate  15 mg Oral Daily  . pantoprazole  40 mg Oral Q1200  . predniSONE  40 mg Oral BID WC  . sodium chloride  3 mL Intravenous Q12H   Continuous Infusions:   Principal Problem:   Acute respiratory failure with hypoxia Active Problems:   Acute pulmonary edema   ESRD on dialysis   Obstructive chronic bronchitis without exacerbation   Diabetes   Cardiomyopathy, ischemic   Anemia of chronic disease    Time spent: > 35 minutes    Penny Pia  [facility] Pager [phone] If 7PM-7AM, please contact night-coverage at [email or web address], password [facility] 11/14/2013, 3:33 PM  LOS: 3 days

## 2013-11-14 NOTE — Progress Notes (Signed)
I left 6E CSW a voicemail with a handoff concerning pt's case. CSW welcome to call me with any questions. I am signing off.   Maryclare Labrador, MSW, [facility] Clinical Social Worker [phone]

## 2013-11-14 NOTE — Progress Notes (Addendum)
Subjective: Still feels SOB but notes slight improvement. She continues on 2L [address].   Objective: Vital signs in last 24 hours: Temp:  [97.3 F (36.3 C)-98.2 F (36.8 C)] 98.2 F (36.8 C) (03/18 1735) Pulse Rate:  [88-100] 88 (03/18 1735) Resp:  [15-20] 18 (03/18 1735) BP: (124-147)/(61-85) 137/77 mmHg (03/18 1735) SpO2:  [94 %-100 %] 100 % (03/18 2046) Weight:  [144 lb 6.4 oz (65.5 kg)-149 lb 4 oz (67.7 kg)] 144 lb 6.4 oz (65.5 kg) (03/18 1117)    Intake/Output from previous day: 03/17 0701 - 03/18 0700 In: -  Out: 250 [Urine:250] Intake/Output this shift:    Medications Current Facility-Administered Medications  Medication Dose Route Frequency Provider Last Rate Last Dose  . 0.9 %  sodium chloride infusion  250 mL Intravenous PRN Vassie Lollarlos Madera, MD      . acetaminophen (TYLENOL) tablet 650 mg  650 mg Oral Q6H PRN Vassie Lollarlos Madera, MD       Or  . acetaminophen (TYLENOL) suppository 650 mg  650 mg Rectal Q6H PRN Vassie Lollarlos Madera, MD      . aspirin EC tablet 81 mg  81 mg Oral Daily Vassie Lollarlos Madera, MD   81 mg at 11/14/13 1450  . budesonide (PULMICORT) nebulizer solution 0.25 mg  0.25 mg Nebulization BID Vassie Lollarlos Madera, MD   0.25 mg at 11/14/13 2045  . [START ON 11/15/2013] carvedilol (COREG) tablet 6.25 mg  6.25 mg Oral BID WC Kathleen Lexavid W Keyah Blizard, MD      . darbepoetin [facility](ARANESP) injection 60 mcg  60 mcg Intravenous Q Wed-HD Arita Missyan B Sanford, MD   60 mcg at 11/14/13 0731  . docusate sodium (COLACE) capsule 100 mg  100 mg Oral BID Vassie Lollarlos Madera, MD   100 mg at 11/14/13 2254  . doxycycline (VIBRA-TABS) tablet 100 mg  100 mg Oral Q12H Vassie Lollarlos Madera, MD   100 mg at 11/14/13 2254  . ferrous sulfate tablet 325 mg  325 mg Oral Q breakfast Vassie Lollarlos Madera, MD   325 mg at 11/14/13 1450  . heparin injection 5,000 Units  5,000 Units Subcutaneous 3 times per day Vassie Lollarlos Madera, MD   5,000 Units at 11/14/13 2255  . heparin injection 6,500 Units  100 Units/kg Dialysis PRN Arita Missyan B Sanford, MD   6,500 Units at  11/14/13 0720  . hydrALAZINE (APRESOLINE) tablet 10 mg  10 mg Oral 3 times per day Cassell Clementhomas Brackbill, MD   10 mg at 11/14/13 2254  . insulin aspart (novoLOG) injection 0-9 Units  0-9 Units Subcutaneous TID WC Vassie Lollarlos Madera, MD   2 Units at 11/13/13 1758  . insulin glargine (LANTUS) injection 25 Units  25 Units Subcutaneous BID Vassie Lollarlos Madera, MD   25 Units at 11/14/13 2255  . isosorbide mononitrate (IMDUR) 24 hr tablet 15 mg  15 mg Oral Daily Cassell Clementhomas Brackbill, MD   15 mg at 11/14/13 1451  . levalbuterol (XOPENEX) nebulizer solution 0.63 mg  0.63 mg Nebulization Q6H PRN Vassie Lollarlos Madera, MD   0.63 mg at 11/14/13 0444  . LORazepam (ATIVAN) tablet 0.5 mg  0.5 mg Oral BID PRN Vassie Lollarlos Madera, MD   0.5 mg at 11/14/13 [id number]  . morphine 2 MG/ML injection 1 mg  1 mg Intravenous Q3H PRN Vassie Lollarlos Madera, MD   1 mg at 11/14/13 1041  . ondansetron (ZOFRAN) tablet 4 mg  4 mg Oral Q6H PRN Vassie Lollarlos Madera, MD       Or  . ondansetron [facility](ZOFRAN) injection 4 mg  4 mg Intravenous Q6H  PRN Vassie Loll, MD   4 mg at 11/14/13 1157  . pantoprazole (PROTONIX) EC tablet 40 mg  40 mg Oral Q1200 Vassie Loll, MD   40 mg at 11/14/13 1451  . predniSONE (DELTASONE) tablet 40 mg  40 mg Oral BID WC Vassie Loll, MD   40 mg at 11/14/13 2254  . sodium chloride 0.9 % injection 3 mL  3 mL Intravenous Q12H Vassie Loll, MD   3 mL at 11/14/13 2256  . sodium chloride 0.9 % injection 3 mL  3 mL Intravenous PRN Vassie Loll, MD        PE: General appearance: alert, cooperative and no distress Neck: no JVD Lungs: bilateral diffuse rhonchi Heart: regular rate and rhythm Extremities: trace edmea on the right; 1+ pretibial edema on the left Pulses: 2+ and symmetric Skin: warm and dry Neurologic: Grossly normal  Lab Results:   Recent Labs  11/12/13 0125 11/14/13 0715  WBC 6.4 9.0  HGB 10.3* 10.5*  HCT 32.1* 33.5*  PLT 301 334   BMET  Recent Labs  11/12/13 0125 11/14/13 0715  NA 138 134*  K 5.3 4.2  CL 96 93*  CO2 29 27    GLUCOSE 127* 167*  BUN 27* 53*  CREATININE 2.23* 2.80*  CALCIUM 9.2 10.3    Assessment/Plan    Principal Problem:   Acute respiratory failure with hypoxia Active Problems:   Acute pulmonary edema   ESRD on dialysis   Cardiomyopathy, ischemic   Acute on chronic combined systolic and diastolic heart failure, NYHA class 4   Severe mitral regurgitation   Obstructive chronic bronchitis without exacerbation   Diabetes   Anemia of chronic disease  Plan:  Pt still feels SOB, although she states she has had mild improvement. She continues on 2L [place].  With bilateral diffuse rhonchi, I suppose this also may be due to her COPD. Will continue to manage her CHF with volume removal through HD.   Because of her multiple comorbidities the patient would be a poor candidate for repeat open heart surgery for mitral valve. Some of her mitral regurgitation may be functional and secondary to her poor LV function and left ventricular dilatation. We will continue medical treatment for her ischemic cardiomyopathy with beta blocker and add nitrates and hydralazine for preload and afterload reduction. With room with BP and HR, we can potentially increase her Coreg to 6.25 mg. Will defer to MD.      LOS: 3 days    Kathleen [name] 11/14/2013 4:27 PM  I have seen and evaluated the patient along with Boyce Medici, PA-C. I do with her findings, examination as well as impression recommendations.  Very unfortunate situation with a woman who has end-stage renal disease on dialysis, COPD and ischemic cardiomyopathy with probably failed CABG. Echocardiographically her ejection fraction is roughly 20-25% with severe mitral regurgitation. I agree with Dr. Patty Sermons that she would not be a very valid operative candidate, in fact she currently states that she would not want to have any additional operations. When I mentioned the possibility of the E-clip percutaneous mitral valve repair procedure offered at  [facility], [facility], and [facility], she declined even having that consideration.  It is quite possible that with a dilated cardiomyopathy and being fluid overloaded that part of the mitral regurgitation is functional and would improve with a decompressing the left ventricle with volume removal. I would expect if he were to recheck her echocardiogram following fluid removal over the next several days,  that her mitral regurgitation will be improved. In fact the murmur does not sound nearly as loud lumbar expect based on the echocardiographic findings.  Elevated troponin in a patient with end-stage renal disease and her dilated cardiomyopathy is most certainly either small vessel related or do to heart failure. I would not pursue an invasive or noninvasive evaluation of possible ischemia.  At this point we are limited to medication adjustments for afterload reduction and rate control. I've increased her carvedilol dose to 6.25 mg daily.  Would also continue to titrate up hydralazine and Imdur for additional afterload reduction.  Kathleen [name], M.D., M.S. Interventional Cardiologist  [facility] Pager # [phone] 11/14/2013

## 2013-11-14 NOTE — Evaluation (Signed)
Physical Therapy Evaluation Patient Details Name: TINNIE SCIORTINO MRN: [id number] DOB: 11/09/1950 Today's Date: 11/14/2013 Time: 6237-6283 PT Time Calculation (min): 24 min  PT Assessment / Plan / Recommendation History of Present Illness  BETTSY MCCRUM is a 63 y.o. female with pmh of ischemic cardiomyopathy, ESRD, HTN, DM (on insulin); came to ED in [place] complaining of acute onset of SOB. Patient denies CP, fever, cough, HA's, melena, abd pain, N/V or any other complaints. Reports that breathing was worse when lying flat. In ED found to have pulmonary edema and arrangements for transferring were provided. Patient received BIPAP, NTG paste and was sent to [facility] for HD and further management.  Clinical Impression  Patient presents with decreased independence with mobility due to deficits listed below.  She will benefit from skilled PT in the acute setting to allow return home after completion of SNF rehab stay.    PT Assessment  Patient needs continued PT services    Follow Up Recommendations  Supervision/Assistance - 24 hour;SNF    Does the patient have the potential to tolerate intense rehabilitation    N/A  Barriers to Discharge  None      Equipment Recommendations  Rolling walker with 5" wheels    Recommendations for Other Services   None  Frequency Min 3X/week    Precautions / Restrictions Precautions Precautions: Fall Restrictions Other Position/Activity Restrictions: WBAT right LE per spouse   Pertinent Vitals/Pain 8/10 pain right LE (patient encouraged to ask for medication.)      Mobility  Bed Mobility Overal bed mobility: Needs Assistance Bed Mobility: Supine to Sit Supine to sit: Min assist;HOB elevated General bed mobility comments: for right LE, pulls up on rails Transfers Overall transfer level: Needs assistance Equipment used: Rolling walker (2 wheeled) Transfers: Sit to/from Stand Sit to Stand: Min guard General transfer comment: cues for hand  placement Ambulation/Gait Ambulation/Gait assistance: Min guard Ambulation Distance (Feet): 30 Feet Assistive device: Rolling walker (2 wheeled) Gait Pattern/deviations: Antalgic;Decreased stride length General Gait Details: antalgic on right, flexed with walker too short (adjusted at end of session)        PT Diagnosis: Generalized weakness;Difficulty walking;Acute pain  PT Problem List: Decreased strength;Decreased mobility;Decreased safety awareness;Decreased activity tolerance;Decreased balance;Decreased knowledge of use of DME;Pain PT Treatment Interventions: DME instruction;Gait training;Therapeutic exercise;Balance training;Functional mobility training;Therapeutic activities;Patient/family education     PT Goals(Current goals can be found in the care plan section) Acute Rehab PT Goals Patient Stated Goal: To get stronger, go back to rehab PT Goal Formulation: With patient/family Time For Goal Achievement: 11/28/13 Potential to Achieve Goals: Good  Visit Information  Last PT Received On: 11/14/13 Assistance Needed: +1 History of Present Illness: NAELLE KESLING is a 63 y.o. female with pmh of ischemic cardiomyopathy, ESRD, HTN, DM (on insulin); came to ED in [place] complaining of acute onset of SOB. Patient denies CP, fever, cough, HA's, melena, abd pain, N/V or any other complaints. Reports that breathing was worse when lying flat. In ED found to have pulmonary edema and arrangements for transferring were provided. Patient received BIPAP, NTG paste and was sent to [facility] for HD and further management.       Prior Functioning  Home Living Family/patient expects to be discharged to:: Skilled nursing facility Prior Function Level of Independence: Needs assistance Gait / Transfers Assistance Needed: assist with walker for ambulation Communication Communication: No difficulties Dominant Hand: Right    Cognition  Cognition Arousal/Alertness: Awake/alert Behavior During  Therapy: WFL for tasks assessed/performed Overall  Cognitive Status: Within Functional Limits for tasks assessed Memory: Decreased short-term memory    Extremity/Trunk Assessment Upper Extremity Assessment Upper Extremity Assessment: Overall WFL for tasks assessed Lower Extremity Assessment Lower Extremity Assessment: RLE deficits/detail RLE Deficits / Details: AAROM WFL, strength hip flexion 2+/4, knee extension 4/5   Balance Balance Overall balance assessment: History of Falls;Needs assistance Sitting-balance support: Feet supported Sitting balance-Leahy Scale: Fair Standing balance support: Bilateral upper extremity supported Standing balance-Leahy Scale: Poor Standing balance comment: UE assist needed for ambulation, loss of balance turning wtih min assist for recovery  End of Session PT - End of Session Equipment Utilized During Treatment: Gait belt Activity Tolerance: Patient limited by fatigue Patient left: in chair;with family/visitor present Nurse Communication: Mobility status  GP     Russell Quinney,CYNDI 11/14/2013, 3:17 PM Rocky Comfortyndi Tashonna Descoteaux, PT [phone] 11/14/2013

## 2013-11-14 NOTE — Progress Notes (Signed)
OT Cancellation Note  Patient Details Name: Kathleen [name] MRN: [id number] DOB: March 27, 1951   Cancelled Treatment:    Reason Eval/Treat Not Completed: OT screened, no acute OT needs identified, will sign off and defer further OT intervention to SNF  Galen Manila, OT 11/14/2013, 2:52 PM

## 2013-11-15 DIAGNOSIS — R52 Pain, unspecified: Secondary | ICD-10-CM

## 2013-11-15 DIAGNOSIS — Z515 Encounter for palliative care: Secondary | ICD-10-CM

## 2013-11-15 DIAGNOSIS — I5043 Acute on chronic combined systolic (congestive) and diastolic (congestive) heart failure: Principal | ICD-10-CM

## 2013-11-15 LAB — CBC
HCT: 35.7 % — ABNORMAL LOW (ref 36.0–46.0)
Hemoglobin: 11 g/dL — ABNORMAL LOW (ref 12.0–15.0)
MCH: 28.1 pg (ref 26.0–34.0)
MCHC: 30.8 g/dL (ref 30.0–36.0)
MCV: 91.3 fL (ref 78.0–100.0)
PLATELETS: 363 10*3/uL (ref 150–400)
RBC: 3.91 MIL/uL (ref 3.87–5.11)
RDW: 18.4 % — AB (ref 11.5–15.5)
WBC: 7.4 10*3/uL (ref 4.0–10.5)

## 2013-11-15 LAB — BASIC METABOLIC PANEL
BUN: 35 mg/dL — ABNORMAL HIGH (ref 6–23)
CALCIUM: 10.2 mg/dL (ref 8.4–10.5)
CO2: 26 mEq/L (ref 19–32)
Chloride: 95 mEq/L — ABNORMAL LOW (ref 96–112)
Creatinine, Ser: 2.14 mg/dL — ABNORMAL HIGH (ref 0.50–1.10)
GFR calc Af Amer: 27 mL/min — ABNORMAL LOW (ref >90)
GFR, EST NON AFRICAN AMERICAN: 23 mL/min — AB (ref >90)
GLUCOSE: 128 mg/dL — AB (ref 70–99)
Potassium: 4.2 mEq/L (ref 3.7–5.3)
SODIUM: 136 meq/L — AB (ref 137–147)

## 2013-11-15 LAB — GLUCOSE, CAPILLARY
GLUCOSE-CAPILLARY: 129 mg/dL — AB (ref 70–99)
GLUCOSE-CAPILLARY: 197 mg/dL — AB (ref 70–99)
Glucose-Capillary: 119 mg/dL — ABNORMAL HIGH (ref 70–99)
Glucose-Capillary: 170 mg/dL — ABNORMAL HIGH (ref 70–99)

## 2013-11-15 MED ORDER — BOOST / RESOURCE BREEZE PO LIQD
1.0000 | Freq: Three times a day (TID) | ORAL | Status: DC
  Administered 2013-11-15 – 2013-11-16 (×4): 1 via ORAL

## 2013-11-15 MED ORDER — ARIPIPRAZOLE 5 MG PO TABS
5.0000 mg | ORAL_TABLET | Freq: Every day | ORAL | Status: DC
  Administered 2013-11-15 – 2013-11-16 (×2): 5 mg via ORAL
  Filled 2013-11-15 (×2): qty 1

## 2013-11-15 MED ORDER — METHYLPREDNISOLONE SODIUM SUCC 40 MG IJ SOLR
40.0000 mg | Freq: Three times a day (TID) | INTRAMUSCULAR | Status: DC
  Administered 2013-11-15 – 2013-11-16 (×2): 40 mg via INTRAVENOUS
  Filled 2013-11-15 (×5): qty 1

## 2013-11-15 MED ORDER — DOXYCYCLINE HYCLATE 100 MG PO TABS
100.0000 mg | ORAL_TABLET | Freq: Two times a day (BID) | ORAL | Status: DC
  Administered 2013-11-15 – 2013-11-16 (×2): 100 mg via ORAL
  Filled 2013-11-15 (×4): qty 1

## 2013-11-15 MED ORDER — CITALOPRAM HYDROBROMIDE 10 MG PO TABS
10.0000 mg | ORAL_TABLET | Freq: Every day | ORAL | Status: DC
  Administered 2013-11-15 – 2013-11-16 (×2): 10 mg via ORAL
  Filled 2013-11-15 (×2): qty 1

## 2013-11-15 MED ORDER — RENA-VITE PO TABS
1.0000 | ORAL_TABLET | Freq: Every day | ORAL | Status: DC
  Administered 2013-11-15: 1 via ORAL
  Filled 2013-11-15 (×2): qty 1

## 2013-11-15 MED ORDER — OXYCODONE HCL ER 10 MG PO T12A
10.0000 mg | EXTENDED_RELEASE_TABLET | Freq: Two times a day (BID) | ORAL | Status: DC
  Administered 2013-11-15 – 2013-11-16 (×3): 10 mg via ORAL
  Filled 2013-11-15 (×3): qty 1

## 2013-11-15 MED ORDER — BISACODYL 10 MG RE SUPP: 10.0000 mg | Freq: Every day | RECTAL | Status: DC | PRN

## 2013-11-15 MED ORDER — OXYCODONE-ACETAMINOPHEN 5-325 MG PO TABS: 1.0000 | ORAL_TABLET | Freq: Four times a day (QID) | ORAL | Status: DC | PRN

## 2013-11-15 NOTE — Consult Note (Signed)
Patient Kathleen [name]      DOB: 08/19/1951      GUY:[id number]     Consult Note from the Palliative Medicine Team at [facility]    Consult Requested by: Dr Cena Benton     PCP: No PCP Per Patient Reason for Consultation: Clarification of GOC and options    Phone Number:None  Assessment of patients Current state:  This 63 year old woman has a history of end-stage renal disease(on dialysis), diabetes mellitus, hypertension, and ischemic cardiomyopathy. She lives in [place] with her husband.   Faced with advanced care decisions and anticipatory care needs    This NP Kathleen [name] reviewed medical records, received report from team, assessed the patient and then meet at the patient's bedside along with her husband Kathleen [name]  to discuss diagnosis prognosis, GOC, EOL wishes disposition and options.   A detailed discussion was had today regarding advanced directives.  Concepts specific to code status, artifical feeding and hydration, continued IV antibiotics and rehospitalization was had.  The difference between a aggressive medical intervention path  and a palliative comfort care path for this patient at this time was had.  Values and goals of care important to patient and family were attempted to be elicited.  Concept of Hospice and Palliative Care were discussed  Natural trajectory and expectations at EOL were discussed.  Questions and concerns addressed.   Family encouraged to call with questions or concerns.  PMT will continue to support holistically.   Goals of Care: 1.  Code Status: DNR/DNI    -  Utilize BiPap if necessary  2. Scope of Treatment:  1.  Patient is hopeful for medical management of her chronic diseases, improvement and return to baseline.      -continue dialysis        4. Disposition:  Hopeful for discharge to SNF for rehabilitation and eventual home with her husband  3. Symptom Management:    1. Psych/Bipolar Disorder: restart home meds                                 Celexa 10 mg daily                                 Abilify: 5 mg daily  2. Pain:  OxyContin 10 mg every 12 hrs scheduled                   Percocet 5-325 mg one tablet every 6 hrs prn  3. Bowel Regimen: Dulcolax supp prn   4. Psychosocial:  Emotional support offered to patient and her husband.  Both understand the seriousness of her medical situation and remain hopeful for return to baseline.  She expresses her fears and feelings regarding her long term poor prognosis.  PMT will continue to support holsitically   Patient Documents Completed or Given: Document Given Completed  Advanced Directives Pkt    MOST  yes  DNR    Gone from [facility]    Hard Choices      Brief DGL:OVFI 63 year old woman has a history of end-stage renal disease(on dialysis), diabetes mellitus, hypertension, and ischemic cardiomyopathy. She lives in [place] with her husband. Her cardiologist there is Dr. Dulce Sellar.She gives a history of gradually worsening dyspnea over the past several weeks. She went to the emergency room in [facility] where chest x-ray showed pulmonary edema  and she was transferred here for further management. She does have a history of end-stage renal disease. She also has a history of COPD and was a smoker until 2010. She had CABG in 2010. She does not recall which hospital she had her heart surgery in. Review of her old [place] records indicate that she underwent VATS procedure by Dr. 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 She denies any recent chest pain. She has had a cough productive of yellow sputum. Home cardiac medications include baby aspirin, low-dose carvedilol, and Lasix. Fx right hip in Feb 2015, reported rehabilitation at SNF.   ROS: weakness, fatigue, generalized pain "I hurt all over"    PMH:  Past Medical History  Diagnosis Date  . Chronic kidney disease   . Diabetes mellitus without complication    . Hypertension   . COPD (chronic obstructive pulmonary disease)   . Ischemic cardiomyopathy   . Anemia      ZOX:WRUEAVW reviewed. No pertinent past surgical history. I have reviewed the FH and SH and  If appropriate update it with new information. Allergies  Allergen Reactions  . Levaquin [Levofloxacin In D5w]   . Metformin And Related   . Other     Mycins allergy   . Sulfa Antibiotics   . Tobradex [Tobramycin-Dexamethasone]    Scheduled Meds: . ARIPiprazole  5 mg Oral Daily  . aspirin EC  81 mg Oral Daily  . budesonide (PULMICORT) nebulizer solution  0.25 mg Nebulization BID  . carvedilol  6.25 mg Oral BID WC  . citalopram  10 mg Oral Daily  . darbepoetin (ARANESP) injection - DIALYSIS  60 mcg Intravenous Q Wed-HD  . docusate sodium  100 mg Oral BID  . doxycycline  100 mg Oral BID AC & HS  . feeding supplement (RESOURCE BREEZE)  1 Container Oral TID BM  . heparin  5,000 Units Subcutaneous 3 times per day  . hydrALAZINE  10 mg Oral 3 times per day  . insulin aspart  0-9 Units Subcutaneous TID WC  . insulin glargine  25 Units Subcutaneous BID  . isosorbide mononitrate  15 mg Oral Daily  . multivitamin  1 tablet Oral QHS  . OxyCODONE  10 mg Oral Q12H  . pantoprazole  40 mg Oral Q1200  . predniSONE  40 mg Oral BID WC  . sodium chloride  3 mL Intravenous Q12H   Continuous Infusions:  PRN Meds:.sodium chloride, acetaminophen, acetaminophen, heparin, levalbuterol, LORazepam, ondansetron (ZOFRAN) IV, ondansetron, oxyCODONE-acetaminophen, sodium chloride    BP 133/72  Pulse 89  Temp(Src) 98 F (36.7 C) (Oral)  Resp 18  Ht 5\' 4"  (1.626 m)  Wt 65.5 kg (144 lb 6.4 oz)  BMI 24.77 kg/m2  SpO2 98%   PPS:30 %   Intake/Output Summary (Last 24 hours) at 11/15/13 1354 Last data filed at 11/15/13 0900  Gross per 24 hour  Intake    480 ml  Output      0 ml  Net    480 ml     Physical Exam:  General: chronically ill appearing, weak, NAD HEENT:  Moist buccal  membranes, no exudate Chest:   CTA CVS: RRR Abdomen: soft NT +BS Ext: without edeam Neuro:alert and oriented X3, with medical decision making capacity  Labs: CBC    Component Value Date/Time   WBC 7.4 11/15/2013 0546   RBC 3.91 11/15/2013 0546   HGB 11.0* 11/15/2013 0546   HCT 35.7* 11/15/2013 0546  PLT 363 11/15/2013 0546   MCV 91.3 11/15/2013 0546   MCH 28.1 11/15/2013 0546   MCHC 30.8 11/15/2013 0546   RDW 18.4* 11/15/2013 0546    BMET    Component Value Date/Time   NA 136* 11/15/2013 0546   K 4.2 11/15/2013 0546   CL 95* 11/15/2013 0546   CO2 26 11/15/2013 0546   GLUCOSE 128* 11/15/2013 0546   BUN 35* 11/15/2013 0546   CREATININE 2.14* 11/15/2013 0546   CALCIUM 10.2 11/15/2013 0546   GFRNONAA 23* 11/15/2013 0546   GFRAA 27* 11/15/2013 0546    CMP     Component Value Date/Time   NA 136* 11/15/2013 0546   K 4.2 11/15/2013 0546   CL 95* 11/15/2013 0546   CO2 26 11/15/2013 0546   GLUCOSE 128* 11/15/2013 0546   BUN 35* 11/15/2013 0546   CREATININE 2.14* 11/15/2013 0546   CALCIUM 10.2 11/15/2013 0546   PROT 7.3 11/11/2013 1906   ALBUMIN 2.7* 11/14/2013 0715   AST 33 11/11/2013 1906   ALT 13 11/11/2013 1906   ALKPHOS 137* 11/11/2013 1906   BILITOT 0.6 11/11/2013 1906   GFRNONAA 23* 11/15/2013 0546   GFRAA 27* 11/15/2013 0546     Time In Time Out Total Time Spent with Patient Total Overall Time  1230 1400 80 min 90 min    Greater than 50%  of this time was spent counseling and coordinating care related to the above assessment and plan.  Kathleen CreedMary Sherah Lund NP  Palliative Medicine Team Team Phone # [phone] Pager [phone]  Discussed with Dr Cena BentonVega

## 2013-11-15 NOTE — Clinical Social Work Note (Signed)
Reviewed and approved as written.  Sayana Salley, MSW, LCSW [phone] 

## 2013-11-15 NOTE — Progress Notes (Signed)
Subjective:  Eating breakfast feels better/ hd yesterday tolerated Objective Vital signs in last 24 hours: Filed Vitals:   11/14/13 1735 11/14/13 2046 11/15/13 0420 11/15/13 0930  BP: 137/77  134/68   Pulse: 88  88   Temp: 98.2 F (36.8 C)  97.6 F (36.4 C)   TempSrc: Oral  Oral   Resp: 18  18   Height:      Weight:      SpO2: 99% 100% 100% 99%  Exam:   Alert nad Chest bibasilar insp coarse rhonchi RRR with Mitral Reg Mur. , no rub Abd soft, nt, nd, no ascites   trace  bilat pretibial/pedal edema L>R  R IJ Perm cath / L UA AVF pos. Bruit developing   Dialysis: MWF [place]/ MWF/ 4 hrs/ f160/ 69 kg/ 2k, 2ca/ R IJ Perm cath/EPO 6000/0 iron/ ep 6000/ no uf profile / no VDRA  Assessment:  1 Acute resp failure / pulm edema/ COPD  Flare/ PNA- sob resolved with some vol uf/ Neb/ steroids/ ABX/  Wt to 65.5 yesterday after hd 2 ESRD on hemodialysis schedule MWF 3 CAD s/p CABG = noted CARD. Plans and need to be careful with addition Hydralazine/ ^ coreg  = that could possibly lower BP on HD preventing uf at any hd tx 4 COPD  As above 5 Ischemic CM- on BB  6 HTN on coreg only  7 Anemia=hgb 11.0 esa hd fu am hgb  Hold for hgb ^/ on po iron / wiil dc and use on hd if tfs low , check op records. 8. 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 Basic Metabolic Panel:  Recent Labs Lab 11/12/13 0125 11/13/13 0900 11/14/13 0715 11/15/13 0546  NA 138  --  134* 136*  K 5.3  --  4.2 4.2  CL 96  --  93* 95*  CO2 29  --  27 26  GLUCOSE 127*  --  167* 128*  BUN 27*  --  53* 35*  CREATININE 2.23*  --  2.80* 2.14*  CALCIUM 9.2  --  10.3 10.2  PHOS  --  4.0 4.4  --    Liver Function Tests:  Recent Labs Lab 11/11/13 1906 11/14/13 0715  AST 33  --   ALT 13  --   ALKPHOS 137*  --   BILITOT  0.6  --   PROT 7.3  --   ALBUMIN 2.6* 2.7*  CBC:  Recent Labs Lab 11/11/13 1234 11/12/13 0125 11/14/13 0715 11/15/13 0546  WBC 8.7 6.4 9.0 7.4  HGB 10.4* 10.3* 10.5* 11.0*  HCT 32.2* 32.1* 33.5* 35.7*  MCV 90.7 90.4 91.0 91.3  PLT 309 301 334 363   Cardiac Enzymes:  Recent Labs Lab 11/11/13 1234 11/11/13 1906 11/12/13 0125  TROPONINI 0.44* <0.30 0.32*   CBG:  Recent Labs Lab 11/13/13 2219 11/14/13 1134 11/14/13 1621 11/14/13 2153 11/15/13 0844  GLUCAP 184* 100* 94 132* 119*   Medications:   . aspirin EC  81 mg Oral Daily  . budesonide (PULMICORT) nebulizer solution  0.25 mg Nebulization BID  . carvedilol  6.25 mg Oral BID WC  . darbepoetin (ARANESP) injection - DIALYSIS  60 mcg Intravenous Q Wed-HD  . docusate sodium  100 mg Oral BID  . doxycycline  100 mg Oral Q12H  . ferrous sulfate  325 mg Oral Q breakfast  . heparin  5,000 Units Subcutaneous 3 times per day  . hydrALAZINE  10 mg Oral 3 times per day  . insulin aspart  0-9 Units Subcutaneous TID WC  . insulin glargine  25 Units Subcutaneous BID  . isosorbide mononitrate  15 mg Oral Daily  . pantoprazole  40 mg Oral Q1200  . predniSONE  40 mg Oral BID WC  . sodium chloride  3 mL Intravenous Q12H    Lenny Pastel, PA-C [facility] Beeper [phone] 11/15/2013,9:45 AM  LOS: 4 days

## 2013-11-15 NOTE — Clinical Social Work Note (Signed)
CSW intern introduced self to patient. Patient was alert and eating at the moment. CSW intern confirmed with patient that she would be returning to [facility]. CSW intern will update patient when ready for D/C.    Deniece Ree, CSW Intern.

## 2013-11-15 NOTE — Consult Note (Signed)
I have reviewed this case with our NP and agree with the Assessment and Plan as stated.  Dannell Raczkowski L. Hikaru Delorenzo, MD MBA The Palliative Medicine Team at [place] Team Phone: [phone] Pager: [phone]   

## 2013-11-15 NOTE — Progress Notes (Signed)
[facility] PROGRESS NOTE  Kathleen [name] DGU:[id number] DOB: [birth year] DOA: 11/11/2013 PCP: No PCP Per Patient Interim History:  63 y.o. female with pmh of ischemic cardiomyopathy, ESRD, HTN, DM (on insulin); came to ED in [place] complaining of acute onset of SOB. Patient denies CP, fever, cough, HA's, melena, abd pain, N/V or any other complaints. Reports that breathing was worse when lying flat. In ED found to have pulmonary edema and arrangements for transferring were provided. Patient received BIPAP, NTG paste and was [facility] for HD and further management.   Assessment/Plan: 1. Acute respiratory failure with hypoxia: secondary to CHF exacerbation and pulmonary edema. With COPD and bronchitis contributing  - Improving: transfered to telemetry floor on 3/16 - Continue O2 supplementation and wean slowly as tolerated. Pt currently on 2L per [place] - Patient with expiratory wheezing throughout.  - Change to IV steroid - Continue antibiotics, pulmicort and PRN xopenex  - Continue b-blockers and ASA  - CXR with improved aeration on 3/17 - 2-D echo with EF 20-25%  - Troponin slightly elevated on  (most likely demand ischemia with pulmonary edema and CHF exacerbation)  - Cardiology consulted  2. Acute pulmonary edema: as mentioned above.  - Volume control with HD  - Continue low sodium diet  - CXR with improved aeration but still with vasc congestion on 3/17  3. ESRD on dialysis: M-W-F.  - Renal service consulted and HD per renal   4. Obstructive chronic bronchitis with exacerbation:  - Continue pulmicort and duonebs  - Continue doxycycline (day 4) and steroid - Continue O2 supplementation and wean as tolerated   5. Diabetes:  - A1C 7.0 on 3/15 - Holding glipizide  - Continue SSI and levemir; will adjust as needed  6. Cardiomyopathy, ischemic and elevated troponin - Per Cardiology, no CP reported.  - EKG w/o acute ischemic changes.  - Continue ASA and B-blocker  -  Volume to be controlled by HD  - Elevation in troponin marginal and most likely demand ischemia  - Follow 2-D echo showing EF 20-25 %; cardiology consulted   7. Anemia of chronic disease: due to renal failure.  - Improving - Will follow renal rec's regarding use of epogen  - Hgb 11.0 on 3/19   8.GERD:  - PPI   DVT prevention: Heparin  Code Status: Full  Family Communication: no family at bedside. Pt updated on plan Disposition Plan: Pending further improvement. SNF   Consultants:  Cardiology  Nephrology  Palliative Care  Procedures:  HD  Antibiotics:  Doxycycline day 4 (started on 3/16)  HPI/Subjective: No new complaints reported. No acute issues overnight.  States her breathing is about the same.  Objective: Filed Vitals:   11/15/13 1359  BP: 128/70  Pulse: 88  Temp: 97.8 F (36.6 C)  Resp: 18    Intake/Output Summary (Last 24 hours) at 11/15/13 1421 Last data filed at 11/15/13 1300  Gross per 24 hour  Intake    720 ml  Output      0 ml  Net    720 ml   Filed Weights   11/13/13 2121 11/14/13 0702 11/14/13 1117  Weight: 67.314 kg (148 lb 6.4 oz) 67.7 kg (149 lb 4 oz) 65.5 kg (144 lb 6.4 oz)    Exam:   General:  Pt in NAD, alert and awake  Cardiovascular: Normal S1 and S2, no murmurs  Respiratory: [place] in place, decreased breath sounds at the bases, expiratory wheezes throughout  Abdomen: soft, obese,  NT, + bowel sounds  Musculoskeletal: no cyanosis or clubbing   Data Reviewed: Basic Metabolic Panel:  Recent Labs Lab 11/11/13 1234 11/12/13 0125 11/13/13 0900 11/14/13 0715 11/15/13 0546  NA  --  138  --  134* 136*  K  --  5.3  --  4.2 4.2  CL  --  96  --  93* 95*  CO2  --  29  --  27 26  GLUCOSE  --  127*  --  167* 128*  BUN  --  27*  --  53* 35*  CREATININE 2.67* 2.23*  --  2.80* 2.14*  CALCIUM  --  9.2  --  10.3 10.2  PHOS  --   --  4.0 4.4  --    Liver Function Tests:  Recent Labs Lab 11/11/13 1906 11/14/13 0715  AST 33   --   ALT 13  --   ALKPHOS 137*  --   BILITOT 0.6  --   PROT 7.3  --   ALBUMIN 2.6* 2.7*   No results found for this basename: LIPASE, AMYLASE,  in the last 168 hours No results found for this basename: AMMONIA,  in the last 168 hours CBC:  Recent Labs Lab 11/11/13 1234 11/12/13 0125 11/14/13 0715 11/15/13 0546  WBC 8.7 6.4 9.0 7.4  HGB 10.4* 10.3* 10.5* 11.0*  HCT 32.2* 32.1* 33.5* 35.7*  MCV 90.7 90.4 91.0 91.3  PLT 309 301 334 363   Cardiac Enzymes:  Recent Labs Lab 11/11/13 1234 11/11/13 1906 11/12/13 0125  TROPONINI 0.44* <0.30 0.32*   BNP (last 3 results) No results found for this basename: PROBNP,  in the last 8760 hours CBG:  Recent Labs Lab 11/14/13 1134 11/14/13 1621 11/14/13 2153 11/15/13 0844 11/15/13 1148  GLUCAP 100* 94 132* 119* 129*    Recent Results (from the past 240 hour(s))  MRSA PCR SCREENING     Status: None   Collection Time    11/11/13 10:25 AM      Result Value Ref Range Status   MRSA by PCR NEGATIVE  NEGATIVE Final   Comment:            The GeneXpert MRSA Assay (FDA     approved for NASAL specimens     only), is one component of a     comprehensive MRSA colonization     surveillance program. It is not     intended to diagnose MRSA     infection nor to guide or     monitor treatment for     MRSA infections.     Studies: No results found.  Scheduled Meds: . ARIPiprazole  5 mg Oral Daily  . aspirin EC  81 mg Oral Daily  . budesonide (PULMICORT) nebulizer solution  0.25 mg Nebulization BID  . carvedilol  6.25 mg Oral BID WC  . citalopram  10 mg Oral Daily  . darbepoetin (ARANESP) injection - DIALYSIS  60 mcg Intravenous Q Wed-HD  . docusate sodium  100 mg Oral BID  . doxycycline  100 mg Oral BID AC & HS  . feeding supplement (RESOURCE BREEZE)  1 Container Oral TID BM  . heparin  5,000 Units Subcutaneous 3 times per day  . hydrALAZINE  10 mg Oral 3 times per day  . insulin aspart  0-9 Units Subcutaneous TID WC  .  insulin glargine  25 Units Subcutaneous BID  . isosorbide mononitrate  15 mg Oral Daily  . multivitamin  1 tablet Oral  QHS  . OxyCODONE  10 mg Oral Q12H  . pantoprazole  40 mg Oral Q1200  . predniSONE  40 mg Oral BID WC  . sodium chloride  3 mL Intravenous Q12H   Continuous Infusions:   Principal Problem:   Acute respiratory failure with hypoxia Active Problems:   Acute pulmonary edema   ESRD on dialysis   Obstructive chronic bronchitis without exacerbation   Diabetes   Cardiomyopathy, ischemic   Anemia of chronic disease   Acute on chronic combined systolic and diastolic heart failure, NYHA class 4   Severe mitral regurgitation    Time spent: > 35 minutes    Parke Simmers  [facility] Pager [phone] If 7PM-7AM, please contact night-coverage at [email or web address], password [facility] 11/15/2013, 2:21 PM  LOS: 4 days

## 2013-11-15 NOTE — Progress Notes (Addendum)
11/15/2013 5:04 PM  Received phone call from CCMD stating that patient had converted from NSR to accelerated junctional rhythym.  Pt stable.  Dr. Cena Benton notified.  Will continue to monitor patient. Kathleen [name]  11/15/2013 5:13 PM Per Dr. Kirby Crigler request, Theodore Demark with Cardiology was notified of patient rhythym.  No further orders received at this time.  Will continue to monitor patient. Kathleen [name]

## 2013-11-15 NOTE — Progress Notes (Signed)
I saw the patient and agree with the above assessment and plan.    Will provide HD tomorrow and challenge EDW further.  Had long conversation about overall medical issues and goals.  She is fatiguing and furstrated from current mgmt.  Some interst in palliative options.  Will have palliative care come by and see.

## 2013-11-15 NOTE — Progress Notes (Signed)
Physical Therapy Treatment Patient Details Name: Kathleen [name] MRN: [id number] DOB: 11-13-1950 Today's Date: 11/15/2013 Time: 1428-1500 PT Time Calculation (min): 32 min  PT Assessment / Plan / Recommendation  History of Present Illness Kathleen [name] is a 63 y.o. female with pmh of ischemic cardiomyopathy, ESRD, HTN, DM (on insulin); came to ED in [place] complaining of acute onset of SOB. Patient denies CP, fever, cough, HA's, melena, abd pain, N/V or any other complaints. Reports that breathing was worse when lying flat. In ED found to have pulmonary edema and arrangements for transferring were provided. Patient received BIPAP, NTG paste and was sent to [facility] for HD and further management.   PT Comments   Patient able to walk farther even after energy expended for washing up due to incontinent in bed.  Likely due to no dialysis today.    Follow Up Recommendations  Supervision/Assistance - 24 hour;SNF           Equipment Recommendations  Rolling walker with 5" wheels    Recommendations for Other Services    Frequency Min 3X/week   Progress towards PT Goals Progress towards PT goals: Progressing toward goals  Plan Current plan remains appropriate    Precautions / Restrictions Precautions Precautions: Fall Restrictions Other Position/Activity Restrictions: WBAT right LE per spouse   Pertinent Vitals/Pain Min c/o right hip soreness    Mobility  Bed Mobility Overal bed mobility: Needs Assistance Supine to sit: Min guard Transfers Equipment used: Rolling walker (2 wheeled) Transfers: Sit to/from Stand Sit to Stand: Min guard Ambulation/Gait Ambulation/Gait assistance: Min guard Ambulation Distance (Feet): 70 Feet Assistive device: Rolling walker (2 wheeled) Gait Pattern/deviations: Step-through pattern;Antalgic;Decreased stride length General Gait Details: antalgic on right    Exercises Total Joint Exercises Ankle Circles/Pumps: AROM;10 reps;Supine Short Arc Quad:  AROM;Both;10 reps;Supine Heel Slides: AROM;Right;10 reps;Supine Hip ABduction/ADduction: AROM;Right;10 reps;Supine Straight Leg Raises: AROM;Both;10 reps;Supine    PT Goals (current goals can now be found in the care plan section)    Visit Information  Last PT Received On: 11/15/13 Assistance Needed: +1 History of Present Illness: Kathleen [name] is a 63 y.o. female with pmh of ischemic cardiomyopathy, ESRD, HTN, DM (on insulin); came to ED in [place] complaining of acute onset of SOB. Patient denies CP, fever, cough, HA's, melena, abd pain, N/V or any other complaints. Reports that breathing was worse when lying flat. In ED found to have pulmonary edema and arrangements for transferring were provided. Patient received BIPAP, NTG paste and was sent to [facility] for HD and further management.    Subjective Data      Cognition  Cognition Arousal/Alertness: Awake/alert Behavior During Therapy: WFL for tasks assessed/performed Overall Cognitive Status: History of cognitive impairments - at baseline    Balance  Balance Standing balance support: Bilateral upper extremity supported Standing balance-Leahy Scale: Poor Standing balance comment: stood for hygiene needs UE support on walker  End of Session PT - End of Session Equipment Utilized During Treatment: Gait belt Activity Tolerance: Patient tolerated treatment well Patient left: in chair;with call bell/phone within reach   GP     [facility] 11/15/2013, 5:30 PM [place], [place] [phone] 11/15/2013

## 2013-11-15 NOTE — Progress Notes (Signed)
Patient Name: Kathleen [name] Date of Encounter: 11/15/2013  Principal Problem:   Acute respiratory failure with hypoxia Active Problems:   Acute pulmonary edema   ESRD on dialysis   Obstructive chronic bronchitis without exacerbation   Diabetes   Cardiomyopathy, ischemic   Anemia of chronic disease   Acute on chronic combined systolic and diastolic heart failure, NYHA class 4   Severe mitral regurgitation    Patient Profile: 63 y.o. female with pmh of ischemic cardiomyopathy, ESRD, HTN, DM (on insulin); came to ED in [place] complaining of acute onset of SOB on 03/15. Cards consulted because of ICD, severe MR, elevated troponin.  SUBJECTIVE: Medicines are making her feel worse, wants to know when she can go home. Still SOB. Weak.  OBJECTIVE Filed Vitals:   11/15/13 0420 11/15/13 0930 11/15/13 1000 11/15/13 1359  BP: 134/68  133/72 128/70  Pulse: 88  89 88  Temp: 97.6 F (36.4 C)  98 F (36.7 C) 97.8 F (36.6 C)  TempSrc: Oral  Oral Oral  Resp: 18  18 18   Height:      Weight:      SpO2: 100% 99% 98% 95%    Intake/Output Summary (Last 24 hours) at 11/15/13 1412 Last data filed at 11/15/13 1300  Gross per 24 hour  Intake    720 ml  Output      0 ml  Net    720 ml   Filed Weights   11/13/13 2121 11/14/13 0702 11/14/13 1117  Weight: 148 lb 6.4 oz (67.314 kg) 149 lb 4 oz (67.7 kg) 144 lb 6.4 oz (65.5 kg)    PHYSICAL EXAM General: Well developed, well nourished, female in no acute distress. Head: Normocephalic, atraumatic.  Neck: Supple without bruits, JVD elevated, approx 9 cm. Lungs:  Resp regular and unlabored, rales bases. Heart: RRR, S1, S2, possible S3, 2-3/6 murmur; no rub. Abdomen: Soft, non-tender, non-distended, BS + x 4.  Extremities: No clubbing, cyanosis, trace edema.  Neuro: Alert and oriented X 3. Moves all extremities spontaneously. Psych: Normal affect.  LABS: CBC: Recent Labs  11/14/13 0715 11/15/13 0546  WBC 9.0 7.4  HGB 10.5*  11.0*  HCT 33.5* 35.7*  MCV 91.0 91.3  PLT 334 363   Basic Metabolic Panel: Recent Labs  11/13/13 0900 11/14/13 0715 11/15/13 0546  NA  --  134* 136*  K  --  4.2 4.2  CL  --  93* 95*  CO2  --  27 26  GLUCOSE  --  167* 128*  BUN  --  53* 35*  CREATININE  --  2.80* 2.14*  CALCIUM  --  10.3 10.2  PHOS 4.0 4.4  --    Liver Function Tests: Recent Labs  11/14/13 0715  ALBUMIN 2.7*   Lab Results  Component Value Date   TROPONINI 0.32* 11/12/2013    TELE: SR, ST. P wave morphology changes noted. PVCs and pairs.        Current Medications:  . ARIPiprazole  5 mg Oral Daily  . aspirin EC  81 mg Oral Daily  . budesonide (PULMICORT) nebulizer solution  0.25 mg Nebulization BID  . carvedilol  6.25 mg Oral BID WC  . citalopram  10 mg Oral Daily  . darbepoetin (ARANESP) injection - DIALYSIS  60 mcg Intravenous Q Wed-HD  . docusate sodium  100 mg Oral BID  . doxycycline  100 mg Oral BID AC & HS  . feeding supplement (RESOURCE BREEZE)  1 Container Oral  TID BM  . heparin  5,000 Units Subcutaneous 3 times per day  . hydrALAZINE  10 mg Oral 3 times per day  . insulin aspart  0-9 Units Subcutaneous TID WC  . insulin glargine  25 Units Subcutaneous BID  . isosorbide mononitrate  15 mg Oral Daily  . multivitamin  1 tablet Oral QHS  . OxyCODONE  10 mg Oral Q12H  . pantoprazole  40 mg Oral Q1200  . predniSONE  40 mg Oral BID WC  . sodium chloride  3 mL Intravenous Q12H      ASSESSMENT AND PLAN:   Ischemic cardiomyopathy - EF 20-25% w/ severe MR. Coreg increased and hydralazine/Imdur added at low doses 03/18. Requested she give the meds a week to allow her body to adjust to them, currently says they make her feel weaker, feel bad. Volume mgt w/ HD by renal team. OK to hold am Rx till after HD    Severe MR - possible functional component, re-assess after volume status is optimized. Not currently a surgical candidate.    Elevated troponin - mild and likely demand ischemia in the  setting of acute volume overload and ESRD. No ongoing ischemic symptoms, no further evaluation indicated.   Otherwise, per primary MD, Renal team   Signed, Theodore Demark , PA-C 2:12 PM 11/15/2013  Patient was seen and examined along with Theodore Demark, PA-C.  Very sad situation with essentially in operable disease. She has severe cardiomyopathy with severe mitral regurgitation, but are likely combined as one might regurgitation may very well be functional. Please see detailed note from last night. I see at this time we are simply assisting with medications for afterload reduction. Would continue to titrate hydralazine and there is blood pressure room if possible.  Palliative care is consulted, and I think setting goals of care is probably about concern. She does not wish additional invasive procedures. At this point she is at the mercy of volume removal by dialysis and blood pressure control.  Marykay Lex, M.D., M.S. Interventional Cardiologist  [facility] Pager # [phone] 11/15/2013

## 2013-11-16 LAB — HEPATITIS B SURFACE ANTIGEN: Hepatitis B Surface Ag: NEGATIVE

## 2013-11-16 LAB — GLUCOSE, CAPILLARY: Glucose-Capillary: 167 mg/dL — ABNORMAL HIGH (ref 70–99)

## 2013-11-16 LAB — HEPATITIS B CORE ANTIBODY, TOTAL: Hep B Core Total Ab: NONREACTIVE

## 2013-11-16 LAB — HEPATITIS B SURFACE ANTIBODY,QUALITATIVE: Hep B S Ab: NEGATIVE

## 2013-11-16 MED ORDER — DOXYCYCLINE HYCLATE 100 MG PO TABS: 100.0000 mg | ORAL_TABLET | Freq: Two times a day (BID) | ORAL | Status: DC

## 2013-11-16 MED ORDER — INSULIN GLARGINE 100 UNIT/ML ~~LOC~~ SOLN: 25.0000 [IU] | Freq: Two times a day (BID) | SUBCUTANEOUS | Status: DC

## 2013-11-16 MED ORDER — CITALOPRAM HYDROBROMIDE 10 MG PO TABS: 10.0000 mg | ORAL_TABLET | Freq: Every day | ORAL | Status: DC

## 2013-11-16 MED ORDER — HYDRALAZINE HCL 10 MG PO TABS: 10.0000 mg | ORAL_TABLET | Freq: Three times a day (TID) | ORAL | Status: DC

## 2013-11-16 MED ORDER — HEPARIN SODIUM (PORCINE) 1000 UNIT/ML DIALYSIS
100.0000 [IU]/kg | INTRAMUSCULAR | Status: DC | PRN
Start: 2013-11-16 — End: 2013-11-16

## 2013-11-16 MED ORDER — CARVEDILOL 6.25 MG PO TABS: 6.2500 mg | ORAL_TABLET | Freq: Two times a day (BID) | ORAL | Status: DC

## 2013-11-16 MED ORDER — LORAZEPAM 0.5 MG PO TABS
ORAL_TABLET | ORAL | Status: AC
  Filled 2013-11-16: qty 1

## 2013-11-16 NOTE — Progress Notes (Signed)
Patient discharged to [facility].  Report called to Asher Muir, Charity fundraiser. Patient with mild confusion, with delayed responses and moments of confusion regarding situation, but [place] staff states this is close to her baseline. Patient remains physiologically stable; no signs or symptoms of distress.  Patient transported via EMS.

## 2013-11-16 NOTE — Discharge Summary (Signed)
Physician Discharge Summary  Kathleen [name] ZOX:[id number] DOB: [birth year] DOA: 11/11/2013  PCP: No PCP Per Patient  Admit date: 11/11/2013 Discharge date: 11/16/2013  Time spent: > 35 minutes  Recommendations for Outpatient Follow-up:  1. Please follow up with your nephrologist for management of your ESRD 2. Also will need f/u with the patient's cardiologist  Discharge Diagnoses:  Principal Problem:   Acute respiratory failure with hypoxia Active Problems:   Acute pulmonary edema   ESRD on dialysis   Obstructive chronic bronchitis without exacerbation   Diabetes   Cardiomyopathy, ischemic   Anemia of chronic disease   Acute on chronic combined systolic and diastolic heart failure, NYHA class 4   Severe mitral regurgitation   Palliative care encounter   Generalized pain   Discharge Condition: stable  Diet recommendation: renal/heart healthy/carb modified.  Filed Weights   11/16/13 0500 11/16/13 0705 11/16/13 1105  Weight: 66.2 kg (145 lb 15.1 oz) 67 kg (147 lb 11.3 oz) 64.7 kg (142 lb 10.2 oz)    History of present illness:  63 y.o. female with pmh of ischemic cardiomyopathy, ESRD, HTN, DM (on insulin); came to ED in [place] complaining of acute onset of SOB. Patient denies CP, fever, cough, HA's, melena, abd pain, N/V or any other complaints. Reports that breathing was worse when lying flat. In ED found to have pulmonary edema and arrangements for transferring were provided. Patient received BIPAP, NTG paste and was [facility] for HD and further management.  Hospital Course:  1. Acute respiratory failure with hypoxia: secondary to CHF exacerbation and pulmonary edema. With COPD and bronchitis contributing  - Improved with dialysis - will finish antibiotic course - stop corticosteroids on d/c given resolution of wheezing  2. Acute pulmonary edema: as mentioned above.  - Volume control with HD  - Continue low sodium diet   3. ESRD on dialysis: M-W-F.  - Pt to continue  routine schedule for HD on d/c  4. Obstructive chronic bronchitis with exacerbation:  - Continue home regimen. - Pt breathing comfortably on room air.  5. Diabetes:  - Will continue lantus on discharge.  6. Cardiomyopathy, ischemic and elevated troponin  - Per Cardiology, no CP reported.  - EKG w/o acute ischemic changes.  - Continue ASA and B-blocker as well as hydralazine  7. Anemia of chronic disease: due to renal failure.  - hgb stable on discharge.   Procedures:  HD  Consultations:  Cardiology  Nephrology  Discharge Exam: Filed Vitals:   11/16/13 1105  BP: 135/61  Pulse: 77  Temp: 97.1 F (36.2 C)  Resp: 15    General: Pt in NAD, Alert and awake Cardiovascular: RRR, no murmurs Respiratory: CTA BL, no wheezes, no increased WOB on room air.  Discharge Instructions  Discharge Orders   Future Orders Complete By Expires   Call MD for:  difficulty breathing, headache or visual disturbances  As directed    Call MD for:  redness, tenderness, or signs of infection (pain, swelling, redness, odor or green/yellow discharge around incision site)  As directed    Call MD for:  temperature >100.4  As directed    Diet - low sodium heart healthy  As directed    Increase activity slowly  As directed        Medication List    STOP taking these medications       furosemide 40 MG tablet  Commonly known as:  LASIX     hydrOXYzine 25 MG tablet  Commonly  known as:  ATARAX/VISTARIL     imipramine 50 MG tablet  Commonly known as:  TOFRANIL     insulin aspart 100 UNIT/ML injection  Commonly known as:  novoLOG     insulin aspart protamine- aspart (70-30) 100 UNIT/ML injection  Commonly known as:  NOVOLOG MIX 70/30     ondansetron 4 MG tablet  Commonly known as:  ZOFRAN      TAKE these medications       acetaminophen 325 MG tablet  Commonly known as:  TYLENOL  Take 650 mg by mouth every 4 (four) hours as needed (pain).     ARIPiprazole 5 MG tablet  Commonly  known as:  ABILIFY  Take 5 mg by mouth daily.     aspirin 81 MG tablet  Take 81 mg by mouth daily.     budesonide-formoterol 160-4.5 MCG/ACT inhaler  Commonly known as:  SYMBICORT  Inhale 2 puffs into the lungs 2 (two) times daily.     carvedilol 6.25 MG tablet  Commonly known as:  COREG  Take 1 tablet (6.25 mg total) by mouth 2 (two) times daily with a meal.     citalopram 10 MG tablet  Commonly known as:  CELEXA  Take 1 tablet (10 mg total) by mouth daily.     docusate sodium 100 MG capsule  Commonly known as:  COLACE  Take 100 mg by mouth daily.     doxycycline 100 MG tablet  Commonly known as:  VIBRA-TABS  Take 1 tablet (100 mg total) by mouth 2 (two) times daily at 8 am and 10 pm.     hydrALAZINE 10 MG tablet  Commonly known as:  APRESOLINE  Take 1 tablet (10 mg total) by mouth every 8 (eight) hours.     insulin glargine 100 UNIT/ML injection  Commonly known as:  LANTUS  Inject 0.25 mLs (25 Units total) into the skin 2 (two) times daily.     ipratropium-albuterol 0.5-2.5 (3) MG/3ML Soln  Commonly known as:  DUONEB  Take 3 mLs by nebulization every 6 (six) hours as needed (shortness of breath).     LORazepam 0.5 MG tablet  Commonly known as:  ATIVAN  Take 0.5 mg by mouth 2 (two) times daily as needed for anxiety.     nitroGLYCERIN 0.4 MG SL tablet  Commonly known as:  NITROSTAT  Place 0.4 mg under the tongue every 5 (five) minutes as needed for chest pain.     omeprazole 40 MG capsule  Commonly known as:  PRILOSEC  Take 40 mg by mouth daily.     oxyCODONE 5 MG immediate release tablet  Commonly known as:  Oxy IR/ROXICODONE  Take 5 mg by mouth every 4 (four) hours as needed for severe pain (pain).     senna 8.6 MG tablet  Commonly known as:  SENOKOT  Take 1 tablet by mouth daily.       Allergies  Allergen Reactions  . Levaquin [Levofloxacin In D5w]   . Metformin And Related   . Other     Mycins allergy   . Sulfa Antibiotics   . Tobradex  [Tobramycin-Dexamethasone]       The results of significant diagnostics from this hospitalization (including imaging, microbiology, ancillary and laboratory) are listed below for reference.    Significant Diagnostic Studies: Dg Chest 2 View  11/13/2013   CLINICAL DATA:  Short of breath, followup of pulmonary edema, former smoking history, dialysis patient  EXAM: CHEST  2 VIEW  COMPARISON:  Portable chest x-ray of 11/11/2013  FINDINGS: The lungs appear slightly better aerated. Chronic changes as well as probable superimposed pulmonary vascular congestion remain. Cardiomegaly is stable and a large bore central venous line remains.  IMPRESSION: Slightly better aeration. Chronic changes in addition to probable superimposed pulmonary vascular congestion.   Electronically Signed   By: Dwyane Dee M.D.   On: 11/13/2013 08:30    Microbiology: Recent Results (from the past 240 hour(s))  MRSA PCR SCREENING     Status: None   Collection Time    11/11/13 10:25 AM      Result Value Ref Range Status   MRSA by PCR NEGATIVE  NEGATIVE Final   Comment:            The GeneXpert MRSA Assay (FDA     approved for NASAL specimens     only), is one component of a     comprehensive MRSA colonization     surveillance program. It is not     intended to diagnose MRSA     infection nor to guide or     monitor treatment for     MRSA infections.     Labs: Basic Metabolic Panel:  Recent Labs Lab 11/11/13 1234 11/12/13 0125 11/13/13 0900 11/14/13 0715 11/15/13 0546  NA  --  138  --  134* 136*  K  --  5.3  --  4.2 4.2  CL  --  96  --  93* 95*  CO2  --  29  --  27 26  GLUCOSE  --  127*  --  167* 128*  BUN  --  27*  --  53* 35*  CREATININE 2.67* 2.23*  --  2.80* 2.14*  CALCIUM  --  9.2  --  10.3 10.2  PHOS  --   --  4.0 4.4  --    Liver Function Tests:  Recent Labs Lab 11/11/13 1906 11/14/13 0715  AST 33  --   ALT 13  --   ALKPHOS 137*  --   BILITOT 0.6  --   PROT 7.3  --   ALBUMIN 2.6*  2.7*   No results found for this basename: LIPASE, AMYLASE,  in the last 168 hours No results found for this basename: AMMONIA,  in the last 168 hours CBC:  Recent Labs Lab 11/11/13 1234 11/12/13 0125 11/14/13 0715 11/15/13 0546  WBC 8.7 6.4 9.0 7.4  HGB 10.4* 10.3* 10.5* 11.0*  HCT 32.2* 32.1* 33.5* 35.7*  MCV 90.7 90.4 91.0 91.3  PLT 309 301 334 363   Cardiac Enzymes:  Recent Labs Lab 11/11/13 1234 11/11/13 1906 11/12/13 0125  TROPONINI 0.44* <0.30 0.32*   BNP: BNP (last 3 results) No results found for this basename: PROBNP,  in the last 8760 hours CBG:  Recent Labs Lab 11/14/13 2153 11/15/13 0844 11/15/13 1148 11/15/13 1708 11/15/13 2137  GLUCAP 132* 119* 129* 197* 170*       Signed:  Penny Pia  [facility] 11/16/2013, 11:46 AM

## 2013-11-16 NOTE — Procedures (Signed)
I was present at this dialysis session. I have reviewed the session itself and made appropriate changes.   Challenging for final EDW 64.5kg.  BP ok.  Qb 400 TDC. On RA and SpO2 97%.    If tolerates treatment well ok with further HD as outpt.    Sabra Heck  MD 11/16/2013, 8:55 AM

## 2013-11-16 NOTE — Care Management Note (Signed)
   CARE MANAGEMENT NOTE 11/16/2013  Patient:  Kathleen [name], Kathleen [name]   Account Number:  [id number]  Date Initiated:  11/16/2013  Documentation initiated by:  Pieter Fooks  Subjective/Objective Assessment:   Order for [facility] .     Action/Plan:   Pt is resident of SNF and plans to return to that facility. HH orders not appropriate.   Anticipated DC Date:  11/16/2013   Anticipated DC Plan:  SKILLED NURSING FACILITY         Choice offered to / List presented to:             Status of service:  Completed, signed off Medicare Important Message given?   (If response is "NO", the following Medicare IM given date fields will be blank) Date Medicare IM given:   Date Additional Medicare IM given:    Discharge Disposition:  SKILLED NURSING FACILITY  Per UR Regulation:    If discussed at Long Length of Stay Meetings, dates discussed:    Comments:

## 2013-11-16 NOTE — Clinical Social Work Note (Signed)
Patient medically stable for discharge back to [facility] skilled nursing facility in [place]. Discharge information transmitted to facility. CSW facilitating transport to SNF via ambulance. CSW talked with patient and husband regarding discharge and transport.  Genelle Bal, MSW, LCSW [phone]

## 2013-12-10 DIAGNOSIS — S72009A Fracture of unspecified part of neck of unspecified femur, initial encounter for closed fracture: Secondary | ICD-10-CM | POA: Insufficient documentation

## 2014-01-01 ENCOUNTER — Encounter (HOSPITAL_COMMUNITY): Payer: Self-pay | Admitting: Emergency Medicine

## 2014-01-01 ENCOUNTER — Emergency Department (HOSPITAL_COMMUNITY): Payer: Medicare Other

## 2014-01-01 ENCOUNTER — Inpatient Hospital Stay (HOSPITAL_COMMUNITY)
Admission: EM | Admit: 2014-01-01 | Discharge: 2014-01-07 | DRG: 280 | Disposition: A | Discharge status: Skilled Nursing Facility | Payer: Medicare Other | Attending: Internal Medicine | Admitting: Internal Medicine

## 2014-01-01 ENCOUNTER — Inpatient Hospital Stay (HOSPITAL_COMMUNITY): Payer: Medicare Other

## 2014-01-01 DIAGNOSIS — I059 Rheumatic mitral valve disease, unspecified: Secondary | ICD-10-CM | POA: Diagnosis present

## 2014-01-01 DIAGNOSIS — N2581 Secondary hyperparathyroidism of renal origin: Secondary | ICD-10-CM | POA: Diagnosis present

## 2014-01-01 DIAGNOSIS — Z992 Dependence on renal dialysis: Secondary | ICD-10-CM | POA: Diagnosis present

## 2014-01-01 DIAGNOSIS — E119 Type 2 diabetes mellitus without complications: Secondary | ICD-10-CM | POA: Diagnosis present

## 2014-01-01 DIAGNOSIS — J449 Chronic obstructive pulmonary disease, unspecified: Secondary | ICD-10-CM | POA: Diagnosis present

## 2014-01-01 DIAGNOSIS — M899 Disorder of bone, unspecified: Secondary | ICD-10-CM | POA: Diagnosis present

## 2014-01-01 DIAGNOSIS — S322XXA Fracture of coccyx, initial encounter for closed fracture: Secondary | ICD-10-CM

## 2014-01-01 DIAGNOSIS — N186 End stage renal disease: Secondary | ICD-10-CM

## 2014-01-01 DIAGNOSIS — J9600 Acute respiratory failure, unspecified whether with hypoxia or hypercapnia: Secondary | ICD-10-CM | POA: Diagnosis present

## 2014-01-01 DIAGNOSIS — Z951 Presence of aortocoronary bypass graft: Secondary | ICD-10-CM

## 2014-01-01 DIAGNOSIS — S329XXA Fracture of unspecified parts of lumbosacral spine and pelvis, initial encounter for closed fracture: Secondary | ICD-10-CM | POA: Diagnosis present

## 2014-01-01 DIAGNOSIS — I214 Non-ST elevation (NSTEMI) myocardial infarction: Principal | ICD-10-CM

## 2014-01-01 DIAGNOSIS — M533 Sacrococcygeal disorders, not elsewhere classified: Secondary | ICD-10-CM

## 2014-01-01 DIAGNOSIS — I5043 Acute on chronic combined systolic (congestive) and diastolic (congestive) heart failure: Secondary | ICD-10-CM

## 2014-01-01 DIAGNOSIS — M545 Low back pain, unspecified: Secondary | ICD-10-CM | POA: Diagnosis present

## 2014-01-01 DIAGNOSIS — M949 Disorder of cartilage, unspecified: Secondary | ICD-10-CM

## 2014-01-01 DIAGNOSIS — Z794 Long term (current) use of insulin: Secondary | ICD-10-CM

## 2014-01-01 DIAGNOSIS — I120 Hypertensive chronic kidney disease with stage 5 chronic kidney disease or end stage renal disease: Secondary | ICD-10-CM | POA: Diagnosis present

## 2014-01-01 DIAGNOSIS — I5022 Chronic systolic (congestive) heart failure: Secondary | ICD-10-CM

## 2014-01-01 DIAGNOSIS — I340 Nonrheumatic mitral (valve) insufficiency: Secondary | ICD-10-CM | POA: Diagnosis present

## 2014-01-01 DIAGNOSIS — W19XXXA Unspecified fall, initial encounter: Secondary | ICD-10-CM | POA: Diagnosis present

## 2014-01-01 DIAGNOSIS — S3210XA Unspecified fracture of sacrum, initial encounter for closed fracture: Secondary | ICD-10-CM

## 2014-01-01 DIAGNOSIS — E861 Hypovolemia: Secondary | ICD-10-CM | POA: Diagnosis not present

## 2014-01-01 DIAGNOSIS — Z87891 Personal history of nicotine dependence: Secondary | ICD-10-CM

## 2014-01-01 DIAGNOSIS — Z881 Allergy status to other antibiotic agents status: Secondary | ICD-10-CM

## 2014-01-01 DIAGNOSIS — J4489 Other specified chronic obstructive pulmonary disease: Secondary | ICD-10-CM | POA: Diagnosis present

## 2014-01-01 DIAGNOSIS — Z66 Do not resuscitate: Secondary | ICD-10-CM | POA: Diagnosis not present

## 2014-01-01 DIAGNOSIS — E8770 Fluid overload, unspecified: Secondary | ICD-10-CM

## 2014-01-01 DIAGNOSIS — R413 Other amnesia: Secondary | ICD-10-CM | POA: Diagnosis present

## 2014-01-01 DIAGNOSIS — I509 Heart failure, unspecified: Secondary | ICD-10-CM | POA: Diagnosis present

## 2014-01-01 DIAGNOSIS — I2589 Other forms of chronic ischemic heart disease: Secondary | ICD-10-CM | POA: Diagnosis present

## 2014-01-01 DIAGNOSIS — D649 Anemia, unspecified: Secondary | ICD-10-CM | POA: Diagnosis present

## 2014-01-01 DIAGNOSIS — Z7982 Long term (current) use of aspirin: Secondary | ICD-10-CM

## 2014-01-01 DIAGNOSIS — Z888 Allergy status to other drugs, medicaments and biological substances status: Secondary | ICD-10-CM

## 2014-01-01 DIAGNOSIS — R4182 Altered mental status, unspecified: Secondary | ICD-10-CM

## 2014-01-01 LAB — BASIC METABOLIC PANEL
BUN: 38 mg/dL — AB (ref 6–23)
CO2: 20 mEq/L (ref 19–32)
CREATININE: 4.38 mg/dL — AB (ref 0.50–1.10)
Calcium: 9.2 mg/dL (ref 8.4–10.5)
Chloride: 93 mEq/L — ABNORMAL LOW (ref 96–112)
GFR calc Af Amer: 11 mL/min — ABNORMAL LOW (ref >90)
GFR, EST NON AFRICAN AMERICAN: 10 mL/min — AB (ref >90)
GLUCOSE: 206 mg/dL — AB (ref 70–99)
Potassium: 5 mEq/L (ref 3.7–5.3)
Sodium: 135 mEq/L — ABNORMAL LOW (ref 137–147)

## 2014-01-01 LAB — I-STAT CHEM 8, ED
BUN: 37 mg/dL — ABNORMAL HIGH (ref 6–23)
Calcium, Ion: 1.03 mmol/L — ABNORMAL LOW (ref 1.13–1.30)
Chloride: 99 mEq/L (ref 96–112)
Creatinine, Ser: 4.7 mg/dL — ABNORMAL HIGH (ref 0.50–1.10)
Glucose, Bld: 202 mg/dL — ABNORMAL HIGH (ref 70–99)
HEMATOCRIT: 38 % (ref 36.0–46.0)
HEMOGLOBIN: 12.9 g/dL (ref 12.0–15.0)
Potassium: 4.7 mEq/L (ref 3.7–5.3)
Sodium: 135 mEq/L — ABNORMAL LOW (ref 137–147)
TCO2: 23 mmol/L (ref 0–100)

## 2014-01-01 LAB — TROPONIN I: Troponin I: 2.3 ng/mL (ref <0.30)

## 2014-01-01 LAB — CBC WITH DIFFERENTIAL/PLATELET
Basophils Absolute: 0 10*3/uL (ref 0.0–0.1)
Basophils Relative: 0 % (ref 0–1)
Eosinophils Absolute: 0 10*3/uL (ref 0.0–0.7)
Eosinophils Relative: 0 % (ref 0–5)
HCT: 34.8 % — ABNORMAL LOW (ref 36.0–46.0)
Hemoglobin: 10.4 g/dL — ABNORMAL LOW (ref 12.0–15.0)
LYMPHS ABS: 1.1 10*3/uL (ref 0.7–4.0)
Lymphocytes Relative: 8 % — ABNORMAL LOW (ref 12–46)
MCH: 26.5 pg (ref 26.0–34.0)
MCHC: 29.9 g/dL — ABNORMAL LOW (ref 30.0–36.0)
MCV: 88.5 fL (ref 78.0–100.0)
Monocytes Absolute: 1 10*3/uL (ref 0.1–1.0)
Monocytes Relative: 7 % (ref 3–12)
Neutro Abs: 12 10*3/uL — ABNORMAL HIGH (ref 1.7–7.7)
Neutrophils Relative %: 85 % — ABNORMAL HIGH (ref 43–77)
PLATELETS: 387 10*3/uL (ref 150–400)
RBC: 3.93 MIL/uL (ref 3.87–5.11)
RDW: 18.6 % — ABNORMAL HIGH (ref 11.5–15.5)
WBC: 14 10*3/uL — ABNORMAL HIGH (ref 4.0–10.5)

## 2014-01-01 LAB — GLUCOSE, CAPILLARY: Glucose-Capillary: 132 mg/dL — ABNORMAL HIGH (ref 70–99)

## 2014-01-01 LAB — MRSA PCR SCREENING: MRSA by PCR: NEGATIVE

## 2014-01-01 LAB — HEPATITIS B SURFACE ANTIGEN: HEP B S AG: NEGATIVE

## 2014-01-01 LAB — CBG MONITORING, ED
Glucose-Capillary: 184 mg/dL — ABNORMAL HIGH (ref 70–99)
Glucose-Capillary: 128 mg/dL — ABNORMAL HIGH (ref 70–99)

## 2014-01-01 MED ORDER — INSULIN ASPART 100 UNIT/ML ~~LOC~~ SOLN
0.0000 [IU] | Freq: Three times a day (TID) | SUBCUTANEOUS | Status: DC
  Administered 2014-01-03: 1 [IU] via SUBCUTANEOUS
  Administered 2014-01-04 – 2014-01-05 (×3): 2 [IU] via SUBCUTANEOUS
  Administered 2014-01-05 (×2): 1 [IU] via SUBCUTANEOUS
  Administered 2014-01-06: 2 [IU] via SUBCUTANEOUS
  Administered 2014-01-06: 3 [IU] via SUBCUTANEOUS
  Administered 2014-01-06: 1 [IU] via SUBCUTANEOUS
  Administered 2014-01-07: 2 [IU] via SUBCUTANEOUS
  Administered 2014-01-07: 3 [IU] via SUBCUTANEOUS

## 2014-01-01 MED ORDER — HEPARIN BOLUS VIA INFUSION
4000.0000 [IU] | Freq: Once | INTRAVENOUS | Status: AC
  Administered 2014-01-01: 4000 [IU] via INTRAVENOUS
  Filled 2014-01-01: qty 4000

## 2014-01-01 MED ORDER — PANTOPRAZOLE SODIUM 40 MG PO TBEC
40.0000 mg | DELAYED_RELEASE_TABLET | Freq: Every day | ORAL | Status: DC
  Administered 2014-01-02 – 2014-01-07 (×6): 40 mg via ORAL
  Filled 2014-01-01 (×6): qty 1

## 2014-01-01 MED ORDER — ACETAMINOPHEN 325 MG PO TABS
650.0000 mg | ORAL_TABLET | Freq: Four times a day (QID) | ORAL | Status: DC | PRN
  Administered 2014-01-04: 650 mg via ORAL
  Filled 2014-01-01: qty 2

## 2014-01-01 MED ORDER — HEPARIN SODIUM (PORCINE) 1000 UNIT/ML DIALYSIS
20.0000 [IU]/kg | Freq: Once | INTRAMUSCULAR | Status: DC
  Filled 2014-01-01: qty 2

## 2014-01-01 MED ORDER — MORPHINE SULFATE 2 MG/ML IJ SOLN
2.0000 mg | INTRAMUSCULAR | Status: AC | PRN
  Administered 2014-01-01 (×2): 3 mg via INTRAVENOUS

## 2014-01-01 MED ORDER — ASPIRIN 81 MG PO TABS: 81.0000 mg | ORAL_TABLET | Freq: Every day | ORAL | Status: DC

## 2014-01-01 MED ORDER — LIDOCAINE-PRILOCAINE 2.5-2.5 % EX CREA
1.0000 "application " | TOPICAL_CREAM | CUTANEOUS | Status: DC | PRN
  Filled 2014-01-01: qty 5

## 2014-01-01 MED ORDER — MORPHINE SULFATE 4 MG/ML IJ SOLN
INTRAMUSCULAR | Status: AC
  Filled 2014-01-01: qty 1

## 2014-01-01 MED ORDER — MORPHINE SULFATE 2 MG/ML IJ SOLN
1.0000 mg | INTRAMUSCULAR | Status: DC | PRN
  Administered 2014-01-02 – 2014-01-03 (×8): 1 mg via INTRAVENOUS
  Filled 2014-01-01 (×9): qty 1

## 2014-01-01 MED ORDER — SODIUM CHLORIDE 0.9 % IV SOLN: 100.0000 mL | INTRAVENOUS | Status: DC | PRN

## 2014-01-01 MED ORDER — NEPRO/CARBSTEADY PO LIQD
237.0000 mL | ORAL | Status: DC | PRN
  Filled 2014-01-01: qty 237

## 2014-01-01 MED ORDER — HEPARIN SODIUM (PORCINE) 1000 UNIT/ML DIALYSIS
1000.0000 [IU] | INTRAMUSCULAR | Status: DC | PRN
  Filled 2014-01-01: qty 1

## 2014-01-01 MED ORDER — MORPHINE SULFATE 4 MG/ML IJ SOLN
4.0000 mg | Freq: Once | INTRAMUSCULAR | Status: AC
  Administered 2014-01-01: 4 mg via INTRAVENOUS
  Filled 2014-01-01: qty 1

## 2014-01-01 MED ORDER — MORPHINE SULFATE 2 MG/ML IJ SOLN: 4.0000 mg | INTRAMUSCULAR | Status: DC | PRN

## 2014-01-01 MED ORDER — MORPHINE SULFATE 4 MG/ML IJ SOLN
INTRAMUSCULAR | Status: AC
  Administered 2014-01-01: 4 mg
  Filled 2014-01-01: qty 1

## 2014-01-01 MED ORDER — ASPIRIN EC 81 MG PO TBEC
81.0000 mg | DELAYED_RELEASE_TABLET | Freq: Every day | ORAL | Status: DC
  Administered 2014-01-02 – 2014-01-07 (×6): 81 mg via ORAL
  Filled 2014-01-01 (×6): qty 1

## 2014-01-01 MED ORDER — CARVEDILOL 6.25 MG PO TABS
6.2500 mg | ORAL_TABLET | Freq: Two times a day (BID) | ORAL | Status: DC
  Filled 2014-01-01 (×4): qty 1

## 2014-01-01 MED ORDER — MORPHINE SULFATE 4 MG/ML IJ SOLN
4.0000 mg | Freq: Once | INTRAMUSCULAR | Status: AC
Start: 2014-01-01 — End: 2014-01-01
  Administered 2014-01-01: 4 mg via INTRAVENOUS
  Filled 2014-01-01: qty 1

## 2014-01-01 MED ORDER — HEPARIN (PORCINE) IN NACL 100-0.45 UNIT/ML-% IJ SOLN
800.0000 [IU]/h | INTRAMUSCULAR | Status: DC
  Administered 2014-01-01: 800 [IU]/h via INTRAVENOUS
  Filled 2014-01-01: qty 250

## 2014-01-01 MED ORDER — SODIUM CHLORIDE 0.9 % IJ SOLN
3.0000 mL | Freq: Two times a day (BID) | INTRAMUSCULAR | Status: DC
  Administered 2014-01-02 – 2014-01-06 (×7): 3 mL via INTRAVENOUS

## 2014-01-01 MED ORDER — IPRATROPIUM-ALBUTEROL 0.5-2.5 (3) MG/3ML IN SOLN: 3.0000 mL | Freq: Four times a day (QID) | RESPIRATORY_TRACT | Status: DC | PRN

## 2014-01-01 MED ORDER — FUROSEMIDE 10 MG/ML IJ SOLN
40.0000 mg | Freq: Once | INTRAMUSCULAR | Status: AC
  Administered 2014-01-01: 40 mg via INTRAVENOUS
  Filled 2014-01-01: qty 4

## 2014-01-01 MED ORDER — BUDESONIDE-FORMOTEROL FUMARATE 160-4.5 MCG/ACT IN AERO
2.0000 | INHALATION_SPRAY | Freq: Two times a day (BID) | RESPIRATORY_TRACT | Status: DC
  Administered 2014-01-02 – 2014-01-07 (×11): 2 via RESPIRATORY_TRACT
  Filled 2014-01-01: qty 6

## 2014-01-01 MED ORDER — CITALOPRAM HYDROBROMIDE 10 MG PO TABS
10.0000 mg | ORAL_TABLET | Freq: Every day | ORAL | Status: DC
  Administered 2014-01-02 – 2014-01-07 (×6): 10 mg via ORAL
  Filled 2014-01-01 (×6): qty 1

## 2014-01-01 MED ORDER — ARIPIPRAZOLE 5 MG PO TABS
5.0000 mg | ORAL_TABLET | Freq: Every day | ORAL | Status: DC
Start: 2014-01-01 — End: 2014-01-03
  Administered 2014-01-02 – 2014-01-03 (×2): 5 mg via ORAL
  Filled 2014-01-01 (×3): qty 1

## 2014-01-01 MED ORDER — ALTEPLASE 2 MG IJ SOLR
2.0000 mg | Freq: Once | INTRAMUSCULAR | Status: AC | PRN
  Filled 2014-01-01: qty 2

## 2014-01-01 MED ORDER — PENTAFLUOROPROP-TETRAFLUOROETH EX AERO: 1.0000 "application " | INHALATION_SPRAY | CUTANEOUS | Status: DC | PRN

## 2014-01-01 MED ORDER — LIDOCAINE HCL (PF) 1 % IJ SOLN: 5.0000 mL | INTRAMUSCULAR | Status: DC | PRN

## 2014-01-01 MED ORDER — ACETAMINOPHEN 650 MG RE SUPP: 650.0000 mg | Freq: Four times a day (QID) | RECTAL | Status: DC | PRN

## 2014-01-01 NOTE — Consult Note (Signed)
Indication for Consultation:  Management of ESRD/hemodialysis; anemia, hypertension/volume and secondary hyperparathyroidism  HPI: Kathleen [name] is a 63 y.o. female who presented to the ED with complaints of back pain after sustaining a fall at home yesterday. She went to [place] [facility] yesterday where they found she had a lower back contusion, pain was not resolved with pain medication so she came to ED at [facility] bc she needs HD today. She recieves HD TTS @ [place]. She was transported from home via EMS and was found to be diaphoretic and clammy with sats in the 80s on RA.   Past Medical History  Diagnosis Date  . Chronic kidney disease   . Diabetes mellitus without complication   . Hypertension   . COPD (chronic obstructive pulmonary disease)   . Ischemic cardiomyopathy   . Anemia    History reviewed. No pertinent past surgical history. No family history on file. Social History:  reports that she has never smoked. She does not have any smokeless tobacco history on file. Her alcohol and drug histories are not on file. Allergies  Allergen Reactions  . Levaquin [Levofloxacin In D5w] Other (See Comments)    Unknown   . Metformin And Related Other (See Comments)    Unknown   . Other     Mycins allergy   . Sulfa Antibiotics Other (See Comments)    Unknown   . Tobradex [Tobramycin-Dexamethasone] Other (See Comments)    Unknown    Prior to Admission medications   Medication Sig Start Date End Date Taking? Authorizing Provider  ARIPiprazole (ABILIFY) 5 MG tablet Take 5 mg by mouth daily.   Yes Historical Provider, MD  aspirin 81 MG tablet Take 81 mg by mouth daily.   Yes Historical Provider, MD  budesonide-formoterol (SYMBICORT) 160-4.5 MCG/ACT inhaler Inhale 2 puffs into the lungs 2 (two) times daily.   Yes Historical Provider, MD  carvedilol (COREG) 6.25 MG tablet Take 1 tablet (6.25 mg total) by mouth 2 (two) times daily with a meal. 11/16/13  Yes Penny Piarlando Vega, MD  cephALEXin  (KEFLEX) 500 MG capsule Take 500 mg by mouth 2 (two) times daily.   Yes Historical Provider, MD  citalopram (CELEXA) 10 MG tablet Take 1 tablet (10 mg total) by mouth daily. 11/16/13  Yes Penny Piarlando Vega, MD  docusate sodium (COLACE) 100 MG capsule Take 100 mg by mouth daily.   Yes Historical Provider, MD  insulin glargine (LANTUS) 100 UNIT/ML injection Inject 0.25 mLs (25 Units total) into the skin 2 (two) times daily. 11/16/13  Yes Penny Piarlando Vega, MD  ipratropium-albuterol (DUONEB) 0.5-2.5 (3) MG/3ML SOLN Take 3 mLs by nebulization every 6 (six) hours as needed (shortness of breath).   Yes Historical Provider, MD  LORazepam (ATIVAN) 0.5 MG tablet Take 0.5 mg by mouth 2 (two) times daily as needed for anxiety.   Yes Historical Provider, MD  nitroGLYCERIN (NITROSTAT) 0.4 MG SL tablet Place 0.4 mg under the tongue every 5 (five) minutes as needed for chest pain.   Yes Historical Provider, MD  omeprazole (PRILOSEC) 40 MG capsule Take 40 mg by mouth daily.   Yes Historical Provider, MD  oxyCODONE (OXY IR/ROXICODONE) 5 MG immediate release tablet Take 5 mg by mouth every 4 (four) hours as needed for severe pain (pain).   Yes Historical Provider, MD  senna (SENOKOT) 8.6 MG tablet Take 1 tablet by mouth daily.   Yes Historical Provider, MD   No current facility-administered medications for this encounter.   Current Outpatient Prescriptions  Medication Sig Dispense Refill  . ARIPiprazole (ABILIFY) 5 MG tablet Take 5 mg by mouth daily.      Marland Kitchen aspirin 81 MG tablet Take 81 mg by mouth daily.      . budesonide-formoterol (SYMBICORT) 160-4.5 MCG/ACT inhaler Inhale 2 puffs into the lungs 2 (two) times daily.      . carvedilol (COREG) 6.25 MG tablet Take 1 tablet (6.25 mg total) by mouth 2 (two) times daily with a meal.  60 tablet  0  . cephALEXin (KEFLEX) 500 MG capsule Take 500 mg by mouth 2 (two) times daily.      . citalopram (CELEXA) 10 MG tablet Take 1 tablet (10 mg total) by mouth daily.  30 tablet  0  .  docusate sodium (COLACE) 100 MG capsule Take 100 mg by mouth daily.      . insulin glargine (LANTUS) 100 UNIT/ML injection Inject 0.25 mLs (25 Units total) into the skin 2 (two) times daily.  10 mL  0  . ipratropium-albuterol (DUONEB) 0.5-2.5 (3) MG/3ML SOLN Take 3 mLs by nebulization every 6 (six) hours as needed (shortness of breath).      . LORazepam (ATIVAN) 0.5 MG tablet Take 0.5 mg by mouth 2 (two) times daily as needed for anxiety.      . nitroGLYCERIN (NITROSTAT) 0.4 MG SL tablet Place 0.4 mg under the tongue every 5 (five) minutes as needed for chest pain.      Marland Kitchen omeprazole (PRILOSEC) 40 MG capsule Take 40 mg by mouth daily.      Marland Kitchen oxyCODONE (OXY IR/ROXICODONE) 5 MG immediate release tablet Take 5 mg by mouth every 4 (four) hours as needed for severe pain (pain).      Marland Kitchen senna (SENOKOT) 8.6 MG tablet Take 1 tablet by mouth daily.       Labs: Basic Metabolic Panel:  Recent Labs Lab 01/01/14 1333  NA 135*  K 4.7  CL 99  GLUCOSE 202*  BUN 37*  CREATININE 4.70*   Liver Function Tests: No results found for this basename: AST, ALT, ALKPHOS, BILITOT, PROT, ALBUMIN,  in the last 168 hours No results found for this basename: LIPASE, AMYLASE,  in the last 168 hours No results found for this basename: AMMONIA,  in the last 168 hours CBC:  Recent Labs Lab 01/01/14 1247 01/01/14 1333  WBC 14.0*  --   NEUTROABS 12.0*  --   HGB 10.4* 12.9  HCT 34.8* 38.0  MCV 88.5  --   PLT 387  --    Cardiac Enzymes: No results found for this basename: CKTOTAL, CKMB, CKMBINDEX, TROPONINI,  in the last 168 hours CBG:  Recent Labs Lab 01/01/14 1232  GLUCAP 184*   Iron Studies: No results found for this basename: IRON, TIBC, TRANSFERRIN, FERRITIN,  in the last 72 hours Studies/Results: Dg Chest Port 1 View  01/01/2014   CLINICAL DATA:  Shortness of breath and dialysis patient. History of CHF.  EXAM: PORTABLE CHEST - 1 VIEW  COMPARISON:  Chest radiograph 11/13/2013 and 09/04/2013  FINDINGS:  Stable cardiomegaly in this patient status post median sternotomy and CABG. The superior most sternotomy wire is fractured, unchanged from chest radiograph dated 11/13/2013. There is mild diffuse pulmonary vascular congestion. Negative for discrete pulmonary edema. Surgical suture is seen in the region of the left upper lung, laterally.  There is irregularity and probable callus formation involving 3 left-sided ribs, suggesting subacute or remote rib fractures. Negative for pneumothorax or pleural effusion.  IMPRESSION: Cardiomegaly and pulmonary  vascular congestion.  Prior CABG.  Satisfactory position of right IJ dialysis catheter.  Approximately 3 left-sided rib fractures suspected, subacute to remote.   Electronically Signed   By: Britta Mccreedy M.D.   On: 01/01/2014 13:27   Review of Systems: Gen: Reports chills and poor appetite. Denies any fever, sweats, fatigue, weakness, malaise, weight loss, and sleep disorder HEENT: No visual complaints, No history of Retinopathy. Normal external appearance No Epistaxis or Sore throat. No sinusitis.   CV: reports occasional peripheral edema. Denies chest pain, angina, palpitations, syncope, orthopnea, PND, and claudication. Resp: Reports Denies dyspnea at rest, dyspnea with exercise x2 days. Denies cough, sputum, wheezing, coughing up blood, and pleurisy. GI: Denies vomiting blood, jaundice, and fecal incontinence.   Denies dysphagia or odynophagia. GU : Denies urinary burning, blood in urine, urinary frequency, urinary hesitancy, nocturnal urination, and urinary incontinence.  No renal calculi. MS: Reports sacral/lower back pain s/p fall. Denies joint pain, limitation of movement, and swelling, stiffness,extremity pain. Denies muscle weakness, cramps, atrophy.  No use of non steroidal antiinflammatory drugs. Derm: Denies rash, itching, dry skin, hives, moles, warts, or unhealing ulcers.  Psych: Reports confusion. Denies depression, anxiety, memory loss, suicidal  ideation, hallucinations, paranoia, Heme: Denies bruising, bleeding, and enlarged lymph nodes. Neuro: No headache.  No diplopia. No dysarthria.  No dysphasia.  No history of CVA.  No Seizures. No paresthesias.  No weakness. Endocrine Reports DM, checks glucose at home, runs 'good'.  No Thyroid disease.  No Adrenal disease.  Physical Exam: Filed Vitals:   01/01/14 1201 01/01/14 1202  BP: 100/55 100/55  Pulse: 87 87  Temp:  98.7 F (37.1 C)  TempSrc:  Oral  Resp: 16 15  Height:  5' 5.5" (1.664 m)  Weight:  64.864 kg (143 lb)  SpO2: 83% 96%     General: Well developed, well nourished, in no acute distress. Head: Normocephalic, atraumatic, sclera non-icteric, mucus membranes are moist Neck: Supple. JVD not elevated. Lungs: shallow, faint crackles bilat bases. Breathing is unlabored. Heart: RRR with S1 S2. No murmurs, rubs, or gallops appreciated. Abdomen: Soft, non-tender, non-distended with normoactive bowel sounds. No rebound/guarding. No obvious abdominal masses. M-S:  Strength and tone appear normal for age. Lower extremities: trace LLE edema. No edema RLE Neuro: Alert and oriented X 2. Moves all extremities spontaneously. Psych:  Responds to questions appropriately with a normal affect. Dialysis Access: R cath. L AVF +bruit/thrill- maturing  Dialysis Orders:  TTS @ [place] 64kg  2K/2Ca  4hr  160  400/1.5   HD 6000 uHeparin  catheter No hectorol Epogen5800 Units IV/HD  No Venofer    Assessment/Plan: 1.  Fall- ? Mid S2 fracture, work up in ED. Pelvis CT pending 2.  ESRD -  TTS @ [place]. HD pending today. K+4.7. edw likely needs to be lowered d/t poor appetite 3.  Hypertension/volume  - 100/55, metop and hydralazine home meds 4.  Anemia  - hgb 12.9, hold ESA. Watch CBC 5.  Metabolic bone disease -  Ca+9.2  Phos 4.3 pth 190- no binders or hectorol. Watch labs 6.  Nutrition - alb 2.6 reports poor appetite. Renal diet and multivit if admitted. 7. DM- per primary if  admitted  Jetty Duhamel, NP [facility] [phone] 01/01/2014, 1:55 PM   Pt seen, examined and agree w A/P as above. 63 yo with ESRD and HTN, DM. She fell with lower back pain and was seen at outside ED yesterday and back xrays were negative for fracture. Back pain  was unbearable and she presented to ED here today.  She had sacral tenderness in ED and plain films of sacrum suggest possible fracture at S2.  A pelvic CT is pending. She is up on HD now. CXR showed vasc congestion, she is on HD and CXR is clear.  Plan HD tonight as tolerated, she is not doing well with HD now due to pain.    Vinson Moselle MD pager [phone]    cell [phone] 01/01/2014, 7:01 PM

## 2014-01-01 NOTE — Procedures (Signed)
I was present at this dialysis session, have reviewed the session itself and made  appropriate changes  Vinson Moselle MD (pgr) [phone]    ([phone] 01/01/2014, 7:00 PM

## 2014-01-01 NOTE — Consult Note (Signed)
Referring Physician:  Primary Physician: No PCP Per Patient Primary Cardiologist: Dr. Bonnielee Haff, [facility] in [place], [phone]; fax [phone] Reason for Consultation: Elevated troponin  HPI: 63 yo female with hx CAD/CABG fell and hurt her back early 05/04. Went to ER at [facility], was treated and released. Her husband could not get her OOB this am to go to dialysis, she was somnolent, confused, very weak and having severe back pain, so EMS was summoned and brought pt to [facility]. In the ER, she got Lasix 40 mg and morphine 4 mg. Her pain is improved but her confusion/memory is worse.   She says she had some chest pain recently but cannot give me any details about it and her family is unaware of any recent chest pain. She saw Dr. Wille Glaser recently and was doing well. She has only been on HD for about 6 months.  Although there is no reported chest pain, she did feel very stressed by the fall early Monday morning.   She had a hip fx with repair in February and has been doing well since then, has been using a walker, but gradually getting stronger. He n;oticed jerking of her extremities last pm, the first time that has happened.    Review of Systems:    Cardiac Review of Systems: {Y] = yes [ ]  = no  Chest Pain [    ]  Resting SOB [   ] Exertional SOB  [ y ]  Orthopnea [  ]   Pedal Edema [   ]    Palpitations [  ] Syncope  [  ]   Presyncope [   ]  General Review of Systems: [Y] = yes [  ]=no Constitional: recent weight change [  ]; anorexia [  ]; fatigue [  ]; nausea [  ]; night sweats [  ]; fever [  ]; or chills [  ];                                                                                                                                          Dental: poor dentition[ y ];   Eye : blurred vision [  ]; diplopia [   ]; vision changes [  ];  Amaurosis fugax[  ]; Resp: cough [  ];  wheezing[  ];  hemoptysis[  ]; shortness of breath[ y ]; paroxysmal nocturnal  dyspnea[  ]; dyspnea on exertion[ y ]; or orthopnea[  ];  GI:  gallstones[  ], vomiting[  ];  dysphagia[  ]; melena[  ];  hematochezia [  ]; heartburn[  ];   Hx of  Colonoscopy[  ]; GU: kidney stones [  ]; hematuria[  ];   dysuria [  ];  nocturia[  ];  history of     obstruction [  ];  Skin: rash, swelling[  ];, hair loss[  ];  peripheral edema[  ];  or itching[  ]; Musculosketetal: myalgias[  ];  joint swelling[  ];  joint erythema[  ];  joint pain[ y ];  back pain[ y ];  Heme/Lymph: bruising[  ];  bleeding[  ];  anemia[  ];  Neuro: TIA[  ];  headaches[  ];  stroke[  ];  vertigo[  ];  seizures[  ];   paresthesias[  ];  difficulty walking[  ];  Psych:depression[  ]; anxiety[  ];  Endocrine: diabetes[  ];  thyroid dysfunction[  ];  Immunizations: Flu [  ]; Pneumococcal[  ];  Other:  Past Medical History  Diagnosis Date  . Chronic kidney disease   . Diabetes mellitus without complication   . Hypertension   . COPD (chronic obstructive pulmonary disease)   . Ischemic cardiomyopathy   . Anemia    Past Surgical History  Procedure Laterality Date  . Knee cartilage surgery Left 2007  . Video assisted thoracoscopy (vats)/ lymph node sampling Left 2007  . Incontinence surgery  ?2006  . Coronary artery bypass graft  2010    x 1   Current Medications:   Allergies  Allergen Reactions  . Levaquin [Levofloxacin In D5w] Other (See Comments)    Unknown   . Metformin And Related Other (See Comments)    Unknown   . Other     Mycins allergy   . Sulfa Antibiotics Other (See Comments)    Unknown   . Tobradex [Tobramycin-Dexamethasone] Other (See Comments)    Unknown    Medication Sig  ARIPiprazole (ABILIFY) 5 MG tablet Take 5 mg by mouth daily.  aspirin 81 MG tablet Take 81 mg by mouth daily.  budesonide-formoterol (SYMBICORT) 160-4.5 MCG/ACT inhaler Inhale 2 puffs into the lungs 2 (two) times daily.  carvedilol (COREG) 6.25 MG tablet Take 1 tablet (6.25 mg total) by mouth  2 (two) times daily with a meal.  cephALEXin (KEFLEX) 500 MG capsule Take 500 mg by mouth 2 (two) times daily.  citalopram (CELEXA) 10 MG tablet Take 1 tablet (10 mg total) by mouth daily.  docusate sodium (COLACE) 100 MG capsule Take 100 mg by mouth daily.  insulin glargine (LANTUS) 100 UNIT/ML injection Inject 0.25 mLs (25 Units total) into the skin 2 (two) times daily.  ipratropium-albuterol (DUONEB) 0.5-2.5 (3) MG/3ML SOLN Take 3 mLs by nebulization every 6 (six) hours as needed (shortness of breath).  LORazepam (ATIVAN) 0.5 MG tablet Take 0.5 mg by mouth 2 (two) times daily as needed for anxiety.  nitroGLYCERIN (NITROSTAT) 0.4 MG SL tablet Place 0.4 mg under the tongue every 5 (five) minutes as needed for chest pain.  omeprazole (PRILOSEC) 40 MG capsule Take 40 mg by mouth daily.  oxyCODONE (OXY IR/ROXICODONE) 5 MG immediate release tablet Take 5 mg by mouth every 4 (four) hours as needed for severe pain (pain).  senna (SENOKOT) 8.6 MG tablet Take 1 tablet by mouth daily.     History   Social History  . Marital Status: Married    Spouse Name: N/A    Number of Children: N/A  . Years of Education: N/A   Occupational History  . Retired    Social History Main Topics  . Smoking status: Former Smoker -- 80 years    Quit date: 08/30/2008  . Smokeless tobacco: Never Used  . Alcohol Use: No  . Drug Use: No  . Sexual Activity: Not on file   Other Topics  Concern  . Not on file   Social History Narrative   Pt lives with husband.   Family Status  Relation Status Death Age  . Mother Deceased 42s    Hx CAD  . Father Deceased 33    Hx CAD    PHYSICAL EXAM: Filed Vitals:   01/01/14 1520  BP: 112/64  Pulse: 85  Temp:   Resp: 20  BP 112/64  Pulse 85  Temp(Src) 98.7 F (37.1 C) (Oral)  Resp 20  Ht 5' 5.5" (1.664 m)  Wt 143 lb (64.864 kg)  BMI 23.43 kg/m2  SpO2 96%  No intake or output data in the 24 hours ending 01/01/14 1556  General:  Well appearing. No  respiratory difficulty HEENT: normal Neck: supple. ++ JVD. Carotids 2+ bilat; no bruits. No lymphadenopathy or thryomegaly appreciated. Cor: PMI nondisplaced. Regular rate & rhythm. No rubs, +S3 gallop or 2/6 systolic murmurs. Lungs: rales bases Abdomen: soft, nontender, nondistended. No hepatosplenomegaly. No bruits or masses. Decreased bowel sounds. Extremities: no cyanosis, clubbing, rash, no edema Neuro: alert & oriented x 3, cranial nerves grossly intact. moves all 4 extremities w/o difficulty. Affect pleasant.  ECG: 01-Jan-2014 14:33:23  Sinus rhythm LAE, consider biatrial enlargement Consider left ventricular hypertrophy Nonspecific T abnormalities, lateral leads Prolonged QT interval Vent. rate 86 BPM PR interval 204 ms QRS duration 123 ms QT/QTc 441/527 ms P-R-T axes 63 104 106  ECHO: 11/13/2013 Study Conclusions - Left ventricle: The cavity size was moderately dilated. Wall thickness was normal. Systolic function was severely reduced. The estimated ejection fraction was in the range of 20% to 25%. Diffuse hypokinesis. The study is not technically sufficient to allow evaluation of LV diastolic function. - Aortic valve: Mild to moderate regurgitation. - Mitral valve: Calcified annulus. Severe regurgitation. - Left atrium: The atrium was moderately dilated. - Right ventricle: Systolic function was moderately reduced. - Right atrium: The atrium was mildly dilated. - Pulmonary arteries: Systolic pressure was moderately increased. PA peak pressure: 51mm Hg (S).  Results for orders placed during the hospital encounter of 01/01/14 (from the past 24 hour(s))  CBG MONITORING, ED     Status: Abnormal   Collection Time    01/01/14 12:32 PM      Result Value Ref Range   Glucose-Capillary 184 (*) 70 - 99 mg/dL  CBC WITH DIFFERENTIAL     Status: Abnormal   Collection Time    01/01/14 12:47 PM      Result Value Ref Range   WBC 14.0 (*) 4.0 - 10.5 K/uL   RBC 3.93  3.87 - 5.11  MIL/uL   Hemoglobin 10.4 (*) 12.0 - 15.0 g/dL   HCT 16.1 (*) 09.6 - 04.5 %   MCV 88.5  78.0 - 100.0 fL   MCH 26.5  26.0 - 34.0 pg   MCHC 29.9 (*) 30.0 - 36.0 g/dL   RDW 40.9 (*) 81.1 - 91.4 %   Platelets 387  150 - 400 K/uL   Neutrophils Relative % 85 (*) 43 - 77 %   Neutro Abs 12.0 (*) 1.7 - 7.7 K/uL   Lymphocytes Relative 8 (*) 12 - 46 %   Lymphs Abs 1.1  0.7 - 4.0 K/uL   Monocytes Relative 7  3 - 12 %   Monocytes Absolute 1.0  0.1 - 1.0 K/uL   Eosinophils Relative 0  0 - 5 %   Eosinophils Absolute 0.0  0.0 - 0.7 K/uL   Basophils Relative 0  0 - 1 %  Basophils Absolute 0.0  0.0 - 0.1 K/uL  BASIC METABOLIC PANEL     Status: Abnormal   Collection Time    01/01/14 12:47 PM      Result Value Ref Range   Sodium 135 (*) 137 - 147 mEq/L   Potassium 5.0  3.7 - 5.3 mEq/L   Chloride 93 (*) 96 - 112 mEq/L   CO2 20  19 - 32 mEq/L   Glucose, Bld 206 (*) 70 - 99 mg/dL   BUN 38 (*) 6 - 23 mg/dL   Creatinine, Ser 1.614.38 (*) 0.50 - 1.10 mg/dL   Calcium 9.2  8.4 - 09.610.5 mg/dL   GFR calc non Af Amer 10 (*) >90 mL/min   GFR calc Af Amer 11 (*) >90 mL/min  TROPONIN I     Status: Abnormal   Collection Time    01/01/14 12:47 PM      Result Value Ref Range   Troponin I 2.30 (*) <0.30 ng/mL  I-STAT CHEM 8, ED     Status: Abnormal   Collection Time    01/01/14  1:33 PM      Result Value Ref Range   Sodium 135 (*) 137 - 147 mEq/L   Potassium 4.7  3.7 - 5.3 mEq/L   Chloride 99  96 - 112 mEq/L   BUN 37 (*) 6 - 23 mg/dL   Creatinine, Ser 0.454.70 (*) 0.50 - 1.10 mg/dL   Glucose, Bld 409202 (*) 70 - 99 mg/dL   Calcium, Ion 8.111.03 (*) 1.13 - 1.30 mmol/L   TCO2 23  0 - 100 mmol/L   Hemoglobin 12.9  12.0 - 15.0 g/dL   HCT 91.438.0  78.236.0 - 95.646.0 %   Radiology:  Dg Sacrum/coccyx 01/01/2014   CLINICAL DATA:  Larey SeatFell, injury, pain  EXAM: SACRUM AND COCCYX - 2+ VIEW  COMPARISON:  None.  FINDINGS: Difficult to exclude a mid sacral fracture, as there is slight angulation with suspected lucency through the S2 segment.  Noncontrast CT scan of the pelvis recommended for further evaluation. Severe osteopenia limits evaluation. Furthermore moderate stool burden obscures much of the sacrum on the AP view. Previous ORIF for right hip fracture. Degenerative disc disease lumbar spine. Vascular calcification.  IMPRESSION: Cannot exclude mid S2 fracture. CT pelvis without contrast recommended.   Electronically Signed   By: Davonna BellingJohn  Curnes M.D.   On: 01/01/2014 14:07   Dg Chest Port 1 View 01/01/2014   CLINICAL DATA:  Shortness of breath and dialysis patient. History of CHF.  EXAM: PORTABLE CHEST - 1 VIEW  COMPARISON:  Chest radiograph 11/13/2013 and 09/04/2013  FINDINGS: Stable cardiomegaly in this patient status post median sternotomy and CABG. The superior most sternotomy wire is fractured, unchanged from chest radiograph dated 11/13/2013. There is mild diffuse pulmonary vascular congestion. Negative for discrete pulmonary edema. Surgical suture is seen in the region of the left upper lung, laterally.  There is irregularity and probable callus formation involving 3 left-sided ribs, suggesting subacute or remote rib fractures. Negative for pneumothorax or pleural effusion.  IMPRESSION: Cardiomegaly and pulmonary vascular congestion.  Prior CABG.  Satisfactory position of right IJ dialysis catheter.  Approximately 3 left-sided rib fractures suspected, subacute to remote.   Electronically Signed   By: Britta MccreedySusan  Turner M.D.   On: 01/01/2014 13:27    ASSESSMENT: Active Problems:   NSTEMI (non-ST elevated myocardial infarction)   ESRD on HD   Fall w/ back injury   HTN   CAD s/p CABG   Chronic  systolic HF EF 20%    Recent Hip fx.   PLAN/DISCUSSION:  NSTEMI is likely type 2 secondary to stress of fall/recent events. Doubt acute vessel closure. Will cycle enzymes, no echo with recent study. Called Dr. Deedra Ehrich office, he will send records. Continue ASA/BB, no statin listed on home meds, MD advise on adding one. Lexiscan in am unless  ECG becomes more abnormal or enzyme go significantly higher.   Darrol Jump, PA-C 01/01/2014 3:56 PM Beeper [phone]  Patient seen and examined with Theodore Demark, PA-C. We discussed all aspects of the encounter. I agree with the assessment and plan as stated above.   63 y/o woman with multiple medical problems (as above) and poor functional capacity admitted with AMS after fall. Troponin mildly elevated but no CP. Has EF 20% with S3 on exam. Suspect troponin elevation may be due to demand ischemia (Type II NSTEMI) vs HF. Agree with diuresis/dialysis. Will get Myoview. Cath only if Myoview high risk. Agree with ASA, b-blocker and statin. Would not heparinize at this point unless active ischemic sx.   Bevelyn Buckles Bensimhon,MD 5:31 PM

## 2014-01-01 NOTE — ED Notes (Signed)
Positive troponin result received from lab, Troponin I: 2.30 with read-back confirmation. Results reported to Dr. Rhunette Croft immediately with confirmation, results read to [facility]

## 2014-01-01 NOTE — Progress Notes (Signed)
ANTICOAGULATION CONSULT NOTE - Initial Consult  Pharmacy Consult for heparin Indication: chest pain/ACS  Allergies  Allergen Reactions  . Levaquin [Levofloxacin In D5w] Other (See Comments)    Unknown   . Metformin And Related Other (See Comments)    Unknown   . Other     Mycins allergy   . Sulfa Antibiotics Other (See Comments)    Unknown   . Tobradex [Tobramycin-Dexamethasone] Other (See Comments)    Unknown     Patient Measurements: Height: 5' 5.5" (166.4 cm) Weight: 143 lb (64.864 kg) IBW/kg (Calculated) : 58.15 Heparin Dosing Weight: 64.9kg  Vital Signs: Temp: 98.7 F (37.1 C) (05/05 1202) Temp src: Oral (05/05 1202) BP: 114/83 mmHg (05/05 1500) Pulse Rate: 88 (05/05 1500)  Labs:  Recent Labs  01/01/14 1247 01/01/14 1333  HGB 10.4* 12.9  HCT 34.8* 38.0  PLT 387  --   CREATININE 4.38* 4.70*  TROPONINI 2.30*  --     Estimated Creatinine Clearance: 11.1 ml/min (by C-G formula based on Cr of 4.7).   Medical History: Past Medical History  Diagnosis Date  . Chronic kidney disease   . Diabetes mellitus without complication   . Hypertension   . COPD (chronic obstructive pulmonary disease)   . Ischemic cardiomyopathy   . Anemia     Medications:  Infusions:  . heparin    . heparin      Assessment: 89 yof presented after recent fall and also for HD management. Her troponin is elevated to IV heparin will be started. CBC is WNL. She is not on any anticoagulation PTA.   Goal of Therapy:  Heparin level 0.3-0.7 units/ml Monitor platelets by anticoagulation protocol: Yes   Plan:  1. Heparin bolus 4000 units IV x 1 2. Heparin gtt 800 units/hr 3. Check an 8 hour heparin level 4. Daily heparin level and CBC  Drake Leach Tytiana Coles 01/01/2014,3:15 PM

## 2014-01-01 NOTE — ED Provider Notes (Signed)
Medical screening examination/treatment/procedure(s) were conducted as a shared visit with non-physician practitioner(s) and myself.  I personally evaluated the patient during the encounter.   EKG Interpretation   Date/Time:  Tuesday Jan 01 2014 12:02:37 EDT Ventricular Rate:  87 PR Interval:  201 QRS Duration: 125 QT Interval:  448 QTC Calculation: 539 R Axis:   92 Text Interpretation:  Sinus rhythm LAE, consider biatrial enlargement  Nonspecific intraventricular conduction delay Nonspecific T abnormalities,  lateral leads Baseline wander in lead(s) I II III aVR aVL aVF V1 V4 V5 V6  Confirmed by Charlii Yost, MD, Kamarah Bilotta ([id number]) on 01/01/2014 12:32:44 PM      Pt comes in with cc of chest pain, back pain. She has hx of CAD, and a recent fall - which has led to back pain. Chest pain is typical, left sided, exertional, and she frequently has diophoresis. EKG shows no acute findings, and exam is benign - except for focal lower back pain, but her troponin is elevated. Pt essentially had a NSTEMI. Will get cards involved.  CRITICAL CARE Performed by: Cearra Portnoy   Total critical care time: 40 min - unstable angina/NSTEMI, with heparin drip, and chest pain control  Critical care time was exclusive of separately billable procedures and treating other patients.  Critical care was necessary to treat or prevent imminent or life-threatening deterioration.  Critical care was time spent personally by me on the following activities: development of treatment plan with patient and/or surrogate as well as nursing, discussions with consultants, evaluation of patient's response to treatment, examination of patient, obtaining history from patient or surrogate, ordering and performing treatments and interventions, ordering and review of laboratory studies, ordering and review of radiographic studies, pulse oximetry and re-evaluation of patient's condition.   Derwood Kaplan, MD 01/01/14 1737

## 2014-01-01 NOTE — ED Notes (Signed)
Patient transported to X-ray 

## 2014-01-01 NOTE — ED Notes (Signed)
Per EMS:  From home, Seen at [facility] yesterday for fall, they said she had lower back contusion, no fracture, pain has not relieved, getting worse, pain meds not helping.  She did not go back to [facility] because she is due for dialysis today, called her PCP, stated that she cannot go without dialysis today.  According to EMS: Clammy, diaphoretic intermittently, sinus tach (95 bpm).  Sob with transfer to stretcher according to EMS  Patient states that her pain is 7/10 lower, denies chest pain but states she had chest pain earlier today and had shortness of breath, but currently denies both.  HD catheter upper right chest.  Restricted extremity on left, maturing fistula on left side.

## 2014-01-01 NOTE — ED Notes (Signed)
Cardiology at bedside.

## 2014-01-01 NOTE — ED Provider Notes (Signed)
CSN: [id number]     Arrival date & time 01/01/14  1144 History   First MD Initiated Contact with Patient 01/01/14 1237     Chief Complaint  Patient presents with  . Back Pain  . Chest Pain  . Shortness of Breath     (Consider location/radiation/quality/duration/timing/severity/associated sxs/prior Treatment) HPI Comments: Patient with h/o of ischemic cardiomyopathy with EF 20-25% (10/2013), ESRD on HD T/T/S (due for dialysis today), HTN, DM (on insulin) -- presents with c/o back pain. Patient fell yesterday on to her back. She was seen at [facility] yesterday and had neg back imaging. D/c with flexeril and percocet which she has been taking without relief. She did not feel well enough to go to dialysis today. The onset of this condition was acute. The course is constant. Aggravating factors: movement and palpation. Alleviating factors: none.   EMS transported patient to hospital. She was found to be clammy, diaphoretic, SOB. O2 sat in 80's on RA (patient is not O2 dependent). Patient states that she feels like he is retaining fluid. Also states she had episode of CP earlier today but is not able to recall details.      The history is provided by the patient and medical records.    Past Medical History  Diagnosis Date  . Chronic kidney disease   . Diabetes mellitus without complication   . Hypertension   . COPD (chronic obstructive pulmonary disease)   . Ischemic cardiomyopathy   . Anemia    History reviewed. No pertinent past surgical history. No family history on file. History  Substance Use Topics  . Smoking status: Never Smoker   . Smokeless tobacco: Not on file  . Alcohol Use: Not on file   OB History   Grav Para Term Preterm Abortions TAB SAB Ect Mult Living                 Review of Systems  All other systems reviewed and are negative.     Allergies  Levaquin; Metformin and related; Other; Sulfa antibiotics; and Tobradex  Home Medications   Prior to  Admission medications   Medication Sig Start Date End Date Taking? Authorizing Provider  ARIPiprazole (ABILIFY) 5 MG tablet Take 5 mg by mouth daily.   Yes Historical Provider, MD  aspirin 81 MG tablet Take 81 mg by mouth daily.   Yes Historical Provider, MD  budesonide-formoterol (SYMBICORT) 160-4.5 MCG/ACT inhaler Inhale 2 puffs into the lungs 2 (two) times daily.   Yes Historical Provider, MD  carvedilol (COREG) 6.25 MG tablet Take 1 tablet (6.25 mg total) by mouth 2 (two) times daily with a meal. 11/16/13  Yes Penny Pia, MD  cephALEXin (KEFLEX) 500 MG capsule Take 500 mg by mouth 2 (two) times daily.   Yes Historical Provider, MD  citalopram (CELEXA) 10 MG tablet Take 1 tablet (10 mg total) by mouth daily. 11/16/13  Yes Penny Pia, MD  docusate sodium (COLACE) 100 MG capsule Take 100 mg by mouth daily.   Yes Historical Provider, MD  insulin glargine (LANTUS) 100 UNIT/ML injection Inject 0.25 mLs (25 Units total) into the skin 2 (two) times daily. 11/16/13  Yes Penny Pia, MD  ipratropium-albuterol (DUONEB) 0.5-2.5 (3) MG/3ML SOLN Take 3 mLs by nebulization every 6 (six) hours as needed (shortness of breath).   Yes Historical Provider, MD  LORazepam (ATIVAN) 0.5 MG tablet Take 0.5 mg by mouth 2 (two) times daily as needed for anxiety.   Yes Historical Provider, MD  nitroGLYCERIN (  NITROSTAT) 0.4 MG SL tablet Place 0.4 mg under the tongue every 5 (five) minutes as needed for chest pain.   Yes Historical Provider, MD  omeprazole (PRILOSEC) 40 MG capsule Take 40 mg by mouth daily.   Yes Historical Provider, MD  oxyCODONE (OXY IR/ROXICODONE) 5 MG immediate release tablet Take 5 mg by mouth every 4 (four) hours as needed for severe pain (pain).   Yes Historical Provider, MD  senna (SENOKOT) 8.6 MG tablet Take 1 tablet by mouth daily.   Yes Historical Provider, MD   BP 100/55  Pulse 87  Temp(Src) 98.7 F (37.1 C) (Oral)  Resp 15  Ht 5' 5.5" (1.664 m)  Wt 143 lb (64.864 kg)  BMI 23.43 kg/m2   SpO2 96%  Physical Exam  Nursing note and vitals reviewed. Constitutional: She appears well-developed and well-nourished.  HENT:  Head: Normocephalic and atraumatic.  Mouth/Throat: Mucous membranes are normal. Mucous membranes are not dry.  Mucous membranes very dry.   Eyes: Conjunctivae are normal. Right eye exhibits no discharge. Left eye exhibits no discharge.  Neck: Trachea normal and normal range of motion. Neck supple. Normal carotid pulses and no JVD present. No muscular tenderness present. Carotid bruit is not present. No tracheal deviation present.  Cardiovascular: Normal rate, regular rhythm, S1 normal, S2 normal, normal heart sounds and intact distal pulses.  Exam reveals no decreased pulses.   No murmur heard. Pulmonary/Chest: Effort normal. No respiratory distress. She has decreased breath sounds. She has no wheezes. She has rhonchi. She exhibits no tenderness.  Abdominal: Soft. Normal aorta and bowel sounds are normal. There is no tenderness. There is no rebound and no guarding.  Musculoskeletal: She exhibits edema (trace bilateral edema, non-pitting).       Cervical back: Normal.       Thoracic back: Normal.       Lumbar back: She exhibits tenderness and bony tenderness.       Back:  Neurological: She is alert.  Skin: Skin is warm and dry. She is not diaphoretic. No cyanosis. No pallor.  Psychiatric: She has a normal mood and affect.    ED Course  Procedures (including critical care time) Labs Review Labs Reviewed  CBC WITH DIFFERENTIAL - Abnormal; Notable for the following:    WBC 14.0 (*)    Hemoglobin 10.4 (*)    HCT 34.8 (*)    MCHC 29.9 (*)    RDW 18.6 (*)    Neutrophils Relative % 85 (*)    Neutro Abs 12.0 (*)    Lymphocytes Relative 8 (*)    All other components within normal limits  BASIC METABOLIC PANEL - Abnormal; Notable for the following:    Sodium 135 (*)    Chloride 93 (*)    Glucose, Bld 206 (*)    BUN 38 (*)    Creatinine, Ser 4.38 (*)     GFR calc non Af Amer 10 (*)    GFR calc Af Amer 11 (*)    All other components within normal limits  TROPONIN I - Abnormal; Notable for the following:    Troponin I 2.30 (*)    All other components within normal limits  CBG MONITORING, ED - Abnormal; Notable for the following:    Glucose-Capillary 184 (*)    All other components within normal limits  I-STAT CHEM 8, ED - Abnormal; Notable for the following:    Sodium 135 (*)    BUN 37 (*)    Creatinine, Ser 4.70 (*)  Glucose, Bld 202 (*)    Calcium, Ion 1.03 (*)    All other components within normal limits  HEPARIN LEVEL (UNFRACTIONATED)    Imaging Review Dg Sacrum/coccyx  01/01/2014   CLINICAL DATA:  Larey Seat, injury, pain  EXAM: SACRUM AND COCCYX - 2+ VIEW  COMPARISON:  None.  FINDINGS: Difficult to exclude a mid sacral fracture, as there is slight angulation with suspected lucency through the S2 segment. Noncontrast CT scan of the pelvis recommended for further evaluation. Severe osteopenia limits evaluation. Furthermore moderate stool burden obscures much of the sacrum on the AP view. Previous ORIF for right hip fracture. Degenerative disc disease lumbar spine. Vascular calcification.  IMPRESSION: Cannot exclude mid S2 fracture. CT pelvis without contrast recommended.   Electronically Signed   By: Davonna Belling M.D.   On: 01/01/2014 14:07   Dg Chest Port 1 View  01/01/2014   CLINICAL DATA:  Shortness of breath and dialysis patient. History of CHF.  EXAM: PORTABLE CHEST - 1 VIEW  COMPARISON:  Chest radiograph 11/13/2013 and 09/04/2013  FINDINGS: Stable cardiomegaly in this patient status post median sternotomy and CABG. The superior most sternotomy wire is fractured, unchanged from chest radiograph dated 11/13/2013. There is mild diffuse pulmonary vascular congestion. Negative for discrete pulmonary edema. Surgical suture is seen in the region of the left upper lung, laterally.  There is irregularity and probable callus formation involving 3  left-sided ribs, suggesting subacute or remote rib fractures. Negative for pneumothorax or pleural effusion.  IMPRESSION: Cardiomegaly and pulmonary vascular congestion.  Prior CABG.  Satisfactory position of right IJ dialysis catheter.  Approximately 3 left-sided rib fractures suspected, subacute to remote.   Electronically Signed   By: Britta Mccreedy M.D.   On: 01/01/2014 13:27     EKG Interpretation   Date/Time:  Tuesday Jan 01 2014 12:02:37 EDT Ventricular Rate:  87 PR Interval:  201 QRS Duration: 125 QT Interval:  448 QTC Calculation: 539 R Axis:   92 Text Interpretation:  Sinus rhythm LAE, consider biatrial enlargement  Nonspecific intraventricular conduction delay Nonspecific T abnormalities,  lateral leads Baseline wander in lead(s) I II III aVR aVL aVF V1 V4 V5 V6  Confirmed by Rhunette Croft, MD, ANKIT ([id number]) on 01/01/2014 12:32:44 PM      12:49 PM Patient seen and examined. Work-up initiated. Medications ordered.   Vital signs reviewed and are as follows: Filed Vitals:   01/01/14 1202  BP: 100/55  Pulse: 87  Temp: 98.7 F (37.1 C)  Resp: 15   3:24 PM Troponin I was elevated. Cards paged. Renal has seen. IMTS to admit.   Possible sacral fracture. CT ordered to eval.  Pt informed of results. She is currently having no CP and sacral pain controlled. Questions answered. Heparin ordered per Dr. Rhunette Croft.   EKG repeated, unchanged, t-wave inversion V6.    MDM   Final diagnoses:  NSTEMI (non-ST elevated myocardial infarction)  Fluid overload  Sacral pain  ESRD (end stage renal disease)   Admit.     Renne Crigler, PA-C 01/01/14 1527

## 2014-01-01 NOTE — ED Notes (Signed)
Disregard "CT pelvis Wo Contrast Completed", CT has NOT been completed yet

## 2014-01-01 NOTE — H&P (Signed)
Date: 01/01/2014               Patient Name:  Kathleen [name] MRN: [id number]  DOB: March 18, 1951 Age / Sex: 63 y.o., female   PCP: No Pcp Per Patient         Medical Service: Internal Medicine Teaching Service         Attending Physician: Dr. Earl Lagos, MD    First Contact: Dr. Angelina Sheriff, MD Pager: [phone]  Second Contact: Dr. Christen Bame, MD Pager: [phone]       After Hours (After 5p/  First Contact Pager: [phone]  weekends / holidays): Second Contact Pager: [phone]   Chief Complaint: Sacral Pain  History of Present Illness: Kathleen [name] is a 63 y.o. woman with a pmhx of CAD s/p CABG ESRD on HD, DM on lantus BID who presented to the ED yesterday with a cc of low back/ buttock pain. Lumbar spine films were negative. Patient was d/c with pain meds. This morning she continued to have sever pain. History is limited due to AMS, which occurred after 4 mg IV morphine in the ED. History is augmented in discussion with the patients husband. The patient's husband reports that she developed AMS after returinng home last night and taking the pain medicines (muscle relaxers?).  This morning the patient had increased WOB with audible sounds that the husband associated with fluid overload. The husband was unable to get the patient up to go to HD due to pain. He called HD center who rec coming to [facility].  EMS arrived and found patient to have desat to 80's on RA. She responded nasal canula and was brought to ED. In ED troponin found to be elevated at 2.3. Cardiology and nephrology were consulted from NSTEMI and HD respectively. IMTS was consulted for admission and maintenance of chronic medical problems.   Meds: Current Facility-Administered Medications  Medication Dose Route Frequency Provider Last Rate Last Dose  . heparin ADULT infusion 100 units/mL (25000 units/250 mL)  800 Units/hr Intravenous Continuous Drake Leach Rumbarger, RPH      . heparin bolus via infusion 4,000 Units  4,000 Units  Intravenous Once Drake Leach Rumbarger, [facility]       Current Outpatient Prescriptions  Medication Sig Dispense Refill  . ARIPiprazole (ABILIFY) 5 MG tablet Take 5 mg by mouth daily.      Marland Kitchen aspirin 81 MG tablet Take 81 mg by mouth daily.      . budesonide-formoterol (SYMBICORT) 160-4.5 MCG/ACT inhaler Inhale 2 puffs into the lungs 2 (two) times daily.      . carvedilol (COREG) 6.25 MG tablet Take 1 tablet (6.25 mg total) by mouth 2 (two) times daily with a meal.  60 tablet  0  . cephALEXin (KEFLEX) 500 MG capsule Take 500 mg by mouth 2 (two) times daily.      . citalopram (CELEXA) 10 MG tablet Take 1 tablet (10 mg total) by mouth daily.  30 tablet  0  . docusate sodium (COLACE) 100 MG capsule Take 100 mg by mouth daily.      . insulin glargine (LANTUS) 100 UNIT/ML injection Inject 0.25 mLs (25 Units total) into the skin 2 (two) times daily.  10 mL  0  . ipratropium-albuterol (DUONEB) 0.5-2.5 (3) MG/3ML SOLN Take 3 mLs by nebulization every 6 (six) hours as needed (shortness of breath).      . LORazepam (ATIVAN) 0.5 MG tablet Take 0.5 mg by mouth 2 (two) times daily as needed for  anxiety.      . nitroGLYCERIN (NITROSTAT) 0.4 MG SL tablet Place 0.4 mg under the tongue every 5 (five) minutes as needed for chest pain.      Marland Kitchen omeprazole (PRILOSEC) 40 MG capsule Take 40 mg by mouth daily.      Marland Kitchen oxyCODONE (OXY IR/ROXICODONE) 5 MG immediate release tablet Take 5 mg by mouth every 4 (four) hours as needed for severe pain (pain).      Marland Kitchen senna (SENOKOT) 8.6 MG tablet Take 1 tablet by mouth daily.        Allergies: Allergies as of 01/01/2014 - Review Complete 01/01/2014  Allergen Reaction Noted  . Levaquin [levofloxacin in d5w] Other (See Comments) 04/06/2013  . Metformin and related Other (See Comments) 04/06/2013  . Other  04/06/2013  . Sulfa antibiotics Other (See Comments) 04/06/2013  . Tobradex [tobramycin-dexamethasone] Other (See Comments) 04/06/2013   Past Medical History  Diagnosis Date  .  Chronic kidney disease   . Diabetes mellitus without complication   . Hypertension   . COPD (chronic obstructive pulmonary disease)   . Ischemic cardiomyopathy   . Anemia    Past Surgical History  Procedure Laterality Date  . Knee cartilage surgery Left 2007  . Video assisted thoracoscopy (vats)/ lymph node sampling Left 2007  . Incontinence surgery  ?2006  . Coronary artery bypass graft  2010    x 1   No family history on file. History   Social History  . Marital Status: Married    Spouse Name: N/A    Number of Children: N/A  . Years of Education: N/A   Occupational History  . Not on file.   Social History Main Topics  . Smoking status: Former Smoker -- 40 years  . Smokeless tobacco: Not on file  . Alcohol Use: Not on file  . Drug Use: Not on file  . Sexual Activity: Not on file   Other Topics Concern  . Not on file   Social History Narrative  . No narrative on file    Review of Systems: Review of systems not obtained due to patient factors.  Physical Exam: Blood pressure 114/83, pulse 88, temperature 98.7 F (37.1 C), temperature source Oral, resp. rate 24, height 5' 5.5" (1.664 m), weight 143 lb (64.864 kg), SpO2 96.00%. Physical Exam  Constitutional: She appears well-developed and well-nourished. No distress.  Eyes: EOM are normal. Pupils are equal, round, and reactive to light.  Cardiovascular: Normal rate, regular rhythm and intact distal pulses.   Murmur heard. Pulmonary/Chest: Effort normal. No respiratory distress. She has no wheezes. She has rales. She exhibits no tenderness.  Abdominal: Soft. Bowel sounds are normal. She exhibits no distension. There is no tenderness.  Neurological: She is alert.  Not oriented. Appears to be hallucinating. Poor attention and concentration.  Skin: She is diaphoretic.  Psychiatric:  Altered.     Lab results: Basic Metabolic Panel:  Recent Labs  16/10/96 1247 01/01/14 1333  NA 135* 135*  K 5.0 4.7  CL 93*  99  CO2 20  --   GLUCOSE 206* 202*  BUN 38* 37*  CREATININE 4.38* 4.70*  CALCIUM 9.2  --    CBC:  Recent Labs  01/01/14 1247 01/01/14 1333  WBC 14.0*  --   NEUTROABS 12.0*  --   HGB 10.4* 12.9  HCT 34.8* 38.0  MCV 88.5  --   PLT 387  --    Cardiac Enzymes:  Recent Labs  01/01/14 1247  TROPONINI 2.30*  CBG:  Recent Labs  01/01/14 1232  GLUCAP 184*    Imaging results:  Dg Sacrum/coccyx  01/01/2014   CLINICAL DATA:  Larey SeatFell, injury, pain  EXAM: SACRUM AND COCCYX - 2+ VIEW  COMPARISON:  None.  FINDINGS: Difficult to exclude a mid sacral fracture, as there is slight angulation with suspected lucency through the S2 segment. Noncontrast CT scan of the pelvis recommended for further evaluation. Severe osteopenia limits evaluation. Furthermore moderate stool burden obscures much of the sacrum on the AP view. Previous ORIF for right hip fracture. Degenerative disc disease lumbar spine. Vascular calcification.  IMPRESSION: Cannot exclude mid S2 fracture. CT pelvis without contrast recommended.   Electronically Signed   By: Davonna BellingJohn  Curnes M.D.   On: 01/01/2014 14:07   Dg Chest Port 1 View  01/01/2014   CLINICAL DATA:  Shortness of breath and dialysis patient. History of CHF.  EXAM: PORTABLE CHEST - 1 VIEW  COMPARISON:  Chest radiograph 11/13/2013 and 09/04/2013  FINDINGS: Stable cardiomegaly in this patient status post median sternotomy and CABG. The superior most sternotomy wire is fractured, unchanged from chest radiograph dated 11/13/2013. There is mild diffuse pulmonary vascular congestion. Negative for discrete pulmonary edema. Surgical suture is seen in the region of the left upper lung, laterally.  There is irregularity and probable callus formation involving 3 left-sided ribs, suggesting subacute or remote rib fractures. Negative for pneumothorax or pleural effusion.  IMPRESSION: Cardiomegaly and pulmonary vascular congestion.  Prior CABG.  Satisfactory position of right IJ dialysis  catheter.  Approximately 3 left-sided rib fractures suspected, subacute to remote.   Electronically Signed   By: Britta MccreedySusan  Turner M.D.   On: 01/01/2014 13:27    Other results: EKG: Sinus, LAE, Lateral lead T wave abnormality, prolonged QT (524).  Assessment & Plan by Problem: Principal Problem:   NSTEMI (non-ST elevated myocardial infarction) Active Problems:   ESRD on dialysis   Diabetes   Acute on chronic combined systolic and diastolic heart failure, NYHA class 4   Severe mitral regurgitation   CCF (congestive cardiac failure)   Sacrum and coccyx fracture   Altered mental state  NSTEMI in setting of severe sCHF and severe MR The patients NSTEMI is likely due to known CAD and volume overload causing hypoxia in. She has a complicated cardiac history with severe sCHF (EF 20% by echo in march 2015). She had CABG previously. She also has structural heart disease of severe MR and mild to moderate AR. Troponin was elevated at 2.30.  - Appreciate card's recs, consulted by ED - ASA, BB (coreg 6.25 mg BID) heparin drip - SDU on tele and continuous pulse Oximetry  Sacral Fracture? The patient may have a sacral fracture. Plan to follow up CT sacrum and consult orthopedics for rec's if positive for fracture. Patient becomes atlered when given 4 mg IV morphine in the ED. - Patient is on oxy IR 5 mg q 4 hr prn at home - Plan for IV morphine 1 mg q 4 hr prn  - PT OT eval and treat - Senokot  ESRD Patient appears to have fluid overload that contributed to her hypoxic respiratory failure. HD per nephrology.  COPD Patient appears to have stable COPD. However, hypoxia is likely multifactorial and COPD may be contributing. - duonebs (xopenex) q 6 h prn - Continue home Symbicort  DM On lantus 25 U BID SSI   Dispo: Disposition is deferred at this time, awaiting improvement of current medical problems. Anticipated discharge in approximately 2-3 day(s).  The patient does not have a current PCP  (No Pcp Per Patient) and does not need an [facility] hospital follow-up appointment after discharge.  The patient does have transportation limitations that hinder transportation to clinic appointments.  Signed: Pleas Koch, MD 01/01/2014, 3:22 PM

## 2014-01-02 ENCOUNTER — Inpatient Hospital Stay (HOSPITAL_COMMUNITY): Payer: Medicare Other

## 2014-01-02 DIAGNOSIS — R079 Chest pain, unspecified: Secondary | ICD-10-CM

## 2014-01-02 DIAGNOSIS — I214 Non-ST elevation (NSTEMI) myocardial infarction: Secondary | ICD-10-CM

## 2014-01-02 DIAGNOSIS — J449 Chronic obstructive pulmonary disease, unspecified: Secondary | ICD-10-CM

## 2014-01-02 DIAGNOSIS — E119 Type 2 diabetes mellitus without complications: Secondary | ICD-10-CM

## 2014-01-02 DIAGNOSIS — I5020 Unspecified systolic (congestive) heart failure: Secondary | ICD-10-CM

## 2014-01-02 DIAGNOSIS — N186 End stage renal disease: Secondary | ICD-10-CM

## 2014-01-02 DIAGNOSIS — I509 Heart failure, unspecified: Secondary | ICD-10-CM

## 2014-01-02 LAB — COMPREHENSIVE METABOLIC PANEL
ALT: 30 U/L (ref 0–35)
AST: 61 U/L — AB (ref 0–37)
Albumin: 2.9 g/dL — ABNORMAL LOW (ref 3.5–5.2)
Alkaline Phosphatase: 146 U/L — ABNORMAL HIGH (ref 39–117)
BUN: 23 mg/dL (ref 6–23)
CALCIUM: 8.5 mg/dL (ref 8.4–10.5)
CO2: 24 mEq/L (ref 19–32)
Chloride: 91 mEq/L — ABNORMAL LOW (ref 96–112)
Creatinine, Ser: 2.79 mg/dL — ABNORMAL HIGH (ref 0.50–1.10)
GFR calc Af Amer: 20 mL/min — ABNORMAL LOW (ref >90)
GFR, EST NON AFRICAN AMERICAN: 17 mL/min — AB (ref >90)
Glucose, Bld: 140 mg/dL — ABNORMAL HIGH (ref 70–99)
Potassium: 3.8 mEq/L (ref 3.7–5.3)
Sodium: 135 mEq/L — ABNORMAL LOW (ref 137–147)
TOTAL PROTEIN: 8.1 g/dL (ref 6.0–8.3)
Total Bilirubin: 1 mg/dL (ref 0.3–1.2)

## 2014-01-02 LAB — URINALYSIS, ROUTINE W REFLEX MICROSCOPIC
Glucose, UA: NEGATIVE mg/dL
Ketones, ur: 15 mg/dL — AB
Nitrite: POSITIVE — AB
PROTEIN: 100 mg/dL — AB
Specific Gravity, Urine: 1.028 (ref 1.005–1.030)
UROBILINOGEN UA: 1 mg/dL (ref 0.0–1.0)
pH: 5 (ref 5.0–8.0)

## 2014-01-02 LAB — GLUCOSE, CAPILLARY
GLUCOSE-CAPILLARY: 113 mg/dL — AB (ref 70–99)
GLUCOSE-CAPILLARY: 122 mg/dL — AB (ref 70–99)
Glucose-Capillary: 88 mg/dL (ref 70–99)
Glucose-Capillary: 98 mg/dL (ref 70–99)

## 2014-01-02 LAB — CBC
HCT: 34.3 % — ABNORMAL LOW (ref 36.0–46.0)
HEMOGLOBIN: 10.4 g/dL — AB (ref 12.0–15.0)
MCH: 26.7 pg (ref 26.0–34.0)
MCHC: 30.3 g/dL (ref 30.0–36.0)
MCV: 88.2 fL (ref 78.0–100.0)
PLATELETS: 411 10*3/uL — AB (ref 150–400)
RBC: 3.89 MIL/uL (ref 3.87–5.11)
RDW: 18.7 % — ABNORMAL HIGH (ref 11.5–15.5)
WBC: 14.4 10*3/uL — ABNORMAL HIGH (ref 4.0–10.5)

## 2014-01-02 LAB — URINE MICROSCOPIC-ADD ON

## 2014-01-02 LAB — TROPONIN I
TROPONIN I: 2.3 ng/mL — AB (ref <0.30)
TROPONIN I: 1.78 ng/mL — AB (ref <0.30)
TROPONIN I: 1.53 ng/mL — AB (ref <0.30)

## 2014-01-02 MED ORDER — ATORVASTATIN CALCIUM 80 MG PO TABS
80.0000 mg | ORAL_TABLET | Freq: Every day | ORAL | Status: DC
  Administered 2014-01-03 – 2014-01-04 (×2): 80 mg via ORAL
  Filled 2014-01-02 (×4): qty 1

## 2014-01-02 MED ORDER — TECHNETIUM TC 99M SESTAMIBI GENERIC - CARDIOLITE
30.0000 | Freq: Once | INTRAVENOUS | Status: AC | PRN
  Administered 2014-01-02: 30 via INTRAVENOUS

## 2014-01-02 MED ORDER — BISACODYL 5 MG PO TBEC: 5.0000 mg | DELAYED_RELEASE_TABLET | Freq: Every day | ORAL | Status: DC | PRN

## 2014-01-02 MED ORDER — CARVEDILOL 6.25 MG PO TABS
6.2500 mg | ORAL_TABLET | Freq: Two times a day (BID) | ORAL | Status: DC
  Administered 2014-01-02 – 2014-01-07 (×9): 6.25 mg via ORAL
  Filled 2014-01-02 (×14): qty 1

## 2014-01-02 MED ORDER — TECHNETIUM TC 99M SESTAMIBI GENERIC - CARDIOLITE
10.0000 | Freq: Once | INTRAVENOUS | Status: AC | PRN
  Administered 2014-01-02: 10 via INTRAVENOUS

## 2014-01-02 MED ORDER — REGADENOSON 0.4 MG/5ML IV SOLN
INTRAVENOUS | Status: AC
  Filled 2014-01-02: qty 5

## 2014-01-02 MED ORDER — MORPHINE SULFATE 2 MG/ML IJ SOLN
1.0000 mg | Freq: Once | INTRAMUSCULAR | Status: AC
  Administered 2014-01-02: 1 mg via INTRAVENOUS
  Filled 2014-01-02: qty 1

## 2014-01-02 MED ORDER — DEXTROSE 5 % IV SOLN
1.0000 g | INTRAVENOUS | Status: DC
  Administered 2014-01-02 – 2014-01-03 (×2): 1 g via INTRAVENOUS
  Filled 2014-01-02 (×3): qty 10

## 2014-01-02 MED ORDER — REGADENOSON 0.4 MG/5ML IV SOLN
0.4000 mg | Freq: Once | INTRAVENOUS | Status: AC
  Administered 2014-01-02: 0.4 mg via INTRAVENOUS
  Filled 2014-01-02: qty 5

## 2014-01-02 NOTE — Progress Notes (Signed)
Chart review complete.  Patient is not eligible for [facility] Care Management services because his/her PCP is not a [facility] primary care provider or is not [facility] affiliated.  For any additional questions or new referrals please contact Tim Henderson BSN RN [facility] Hospital Liaison at [phone] °

## 2014-01-02 NOTE — Progress Notes (Signed)
Patient with positive UA results. MD notified. Awaiting new orders. RN will continue to monitor. Louretta Parma, RN

## 2014-01-02 NOTE — Consult Note (Signed)
[facility] NOTE   MRN : [id number]  Reason for Consult: Non maturing left UA AV Fistula Referring Physician: [name], MD   History of Present Illness: 63 y/o she is on dialysis via catheter and had a left AV fistula creation a few months ago as well as a right IJ catheter.  She has had about 6 treatments on dialysis.  She was brought to the ER after a fall on 01/01/2014 and sustained rib and spine fractures.  Her past medical history includes CAD s/p CABG, chronic systolic HF, DM, and ESRD.   She denises hyperlipidemia and has tried statins in the past and can not tolerate them.  She is taking Asprin, Carvedilol, and insulin.   We have been asked to access her left AV fistula and work up for permanent access for dialysis.  She does T/TH/SAT dialysis.     Current Facility-Administered Medications  Medication Dose Route Frequency Provider Last Rate Last Dose  . 0.9 %  sodium chloride infusion  100 mL Intravenous PRN Derrill Kay, NP      . 0.9 %  sodium chloride infusion  100 mL Intravenous PRN Derrill Kay, NP      . acetaminophen (TYLENOL) tablet 650 mg  650 mg Oral Q6H PRN Christen Bame, MD       Or  . acetaminophen (TYLENOL) suppository 650 mg  650 mg Rectal Q6H PRN Christen Bame, MD      . ARIPiprazole (ABILIFY) tablet 5 mg  5 mg Oral Daily Christen Bame, MD   5 mg at 01/02/14 0940  . aspirin EC tablet 81 mg  81 mg Oral Daily Nischal Narendra, MD   81 mg at 01/02/14 0940  . budesonide-formoterol (SYMBICORT) 160-4.5 MCG/ACT inhaler 2 puff  2 puff Inhalation BID Christen Bame, MD   2 puff at 01/02/14 0806  . carvedilol (COREG) tablet 6.25 mg  6.25 mg Oral BID WC Nischal Narendra, MD   6.25 mg at 01/02/14 0940  . citalopram (CELEXA) tablet 10 mg  10 mg Oral Daily Christen Bame, MD   10 mg at 01/02/14 0939  . feeding supplement (NEPRO CARB STEADY) liquid 237 mL  237 mL Oral PRN Derrill Kay, NP      . heparin injection 1,000 Units  1,000  Units Dialysis PRN Derrill Kay, NP      . heparin injection 1,300 Units  20 Units/kg Dialysis Once in dialysis Derrill Kay, NP      . insulin aspart (novoLOG) injection 0-9 Units  0-9 Units Subcutaneous TID WC Christen Bame, MD      . ipratropium-albuterol (DUONEB) 0.5-2.5 (3) MG/3ML nebulizer solution 3 mL  3 mL Nebulization Q6H PRN Christen Bame, MD      . lidocaine (PF) (XYLOCAINE) 1 % injection 5 mL  5 mL Intradermal PRN Derrill Kay, NP      . lidocaine-prilocaine (EMLA) cream 1 application  1 application Topical PRN Derrill Kay, NP      . morphine 2 MG/ML injection 1 mg  1 mg Intravenous Q4H PRN Carlynn Purl, DO   1 mg at 01/02/14 1041  . pantoprazole (PROTONIX) EC tablet 40 mg  40 mg Oral Daily Christen Bame, MD   40 mg at 01/02/14 0940  . pentafluoroprop-tetrafluoroeth (GEBAUERS) aerosol 1 application  1 application Topical PRN Derrill Kay, NP      . sodium chloride 0.9 % injection 3 mL  3 mL Intravenous  Q12H Christen BameNora Sadek, MD   3 mL at 01/02/14 1000    Pt meds include: Statin :No Betablocker: Yes ASA: Yes Other anticoagulants/antiplatelets:   Past Medical History  Diagnosis Date  . Chronic kidney disease   . Diabetes mellitus without complication   . Hypertension   . COPD (chronic obstructive pulmonary disease)   . Ischemic cardiomyopathy   . Anemia     Past Surgical History  Procedure Laterality Date  . Knee cartilage surgery Left 2007  . Video assisted thoracoscopy (vats)/ lymph node sampling Left 2007  . Incontinence surgery  ?2006  . Coronary artery bypass graft  2010    x 1    Social History History  Substance Use Topics  . Smoking status: Former Smoker -- 80 years    Quit date: 08/30/2008  . Smokeless tobacco: Never Used  . Alcohol Use: No    Family History Mother DM, CAD, ESRD, Hypertension Father CAD, COPD  Allergies  Allergen Reactions  . Levaquin [Levofloxacin In D5w] Other (See Comments)    Unknown   . Metformin And  Related Other (See Comments)    Unknown   . Other     Mycins allergy   . Sulfa Antibiotics Other (See Comments)    Unknown   . Tobradex [Tobramycin-Dexamethasone] Other (See Comments)    Unknown      REVIEW OF SYSTEMS  General: [ ]  Weight loss, [ ]  Fever, [ ]  chills Neurologic: x Dizziness, [ ]  Blackouts, [ ]  Seizure [ ]  Stroke, [ ]  "Mini stroke", [ ]  Slurred speech, [ ]  Temporary blindness; [ ]  weakness in arms or legs, [ ]  Hoarseness [ ]  Dysphagia Cardiac: [ ]  Chest pain/pressure, [ ]  Shortness of breath at rest [ ]  Shortness of breath with exertion, [ ]  Atrial fibrillation or irregular heartbeat  Vascular: [x ] Pain in legs with walking, [ ]  Pain in legs at rest, [ ]  Pain in legs at night,  [ ]  Non-healing ulcer, [ ]  Blood clot in vein/DVT,   Pulmonary: [ ]  Home oxygen, [ ]  Productive cough, [ ]  Coughing up blood, [x ] Asthma,  [ ]  Wheezing [ x] COPD Musculoskeletal:  [ ]  Arthritis, [ ]  Low back pain, [ ]  Joint pain Hematologic: [ ]  Easy Bruising, [x ] Anemia; [ ]  Hepatitis Gastrointestinal: [ ]  Blood in stool, [ ]  Gastroesophageal Reflux/heartburn, Urinary: [x ] chronic Kidney disease, [ ]  on HD - [ ]  MWF or [x ] TTHS, [ ]  Burning with urination, [ ]  Difficulty urinating Skin: [ ]  Rashes, [ ]  Wounds Psychological: [x ] Anxiety, [x ] Depression  Physical Examination Filed Vitals:   01/01/14 2113 01/02/14 0003 01/02/14 0543 01/02/14 0900  BP: 117/61 134/84 102/59 100/55  Pulse: 88 89 75 77  Temp: 98.7 F (37.1 C) 97.7 F (36.5 C) 97.8 F (36.6 C) 97.5 F (36.4 C)  TempSrc: Oral Axillary Oral Oral  Resp: 18 20 18 18   Height:      Weight:   132 lb 4.8 oz (60.011 kg)   SpO2: 99% 95% 96% 99%   Body mass index is 21.67 kg/(m^2).  General:  WDWN in NAD Gait: Normal HENT: WNL Eyes: Pupils equal Pulmonary: normal non-labored breathing , without Rales, rhonchi,  wheezing Cardiac: RRR, without  Murmurs, rubs or gallops; No carotid bruits Abdomen: soft, NT, no  masses Skin: no rashes, ulcers noted;  no Gangrene , no cellulitis; no open wounds;   Vascular Exam/Pulses:Palpable radial and brachial pulses bilateral.  Left AV fistula palpable thrill strongest anastomosis.  Palpable DP bilateral   Musculoskeletal: no muscle wasting or atrophy; no edema  Neurologic: A&O X 3; Appropriate Affect ;  SENSATION: normal; MOTOR FUNCTION: 4/5 Symmetric LE due to pain. Speech is fluent/normal   Significant Diagnostic Studies: CBC Lab Results  Component Value Date   WBC 14.4* 01/02/2014   HGB 10.4* 01/02/2014   HCT 34.3* 01/02/2014   MCV 88.2 01/02/2014   PLT 411* 01/02/2014    BMET    Component Value Date/Time   NA 135* 01/02/2014 0055   K 3.8 01/02/2014 0055   CL 91* 01/02/2014 0055   CO2 24 01/02/2014 0055   GLUCOSE 140* 01/02/2014 0055   BUN 23 01/02/2014 0055   CREATININE 2.79* 01/02/2014 0055   CALCIUM 8.5 01/02/2014 0055   GFRNONAA 17* 01/02/2014 0055   GFRAA 20* 01/02/2014 0055   Estimated Creatinine Clearance: 18.7 ml/min (by C-G formula based on Cr of 2.79).  COAG No results found for this basename: INR, PROTIME     Non-Invasive Vascular Imaging: Pending duplex and vein mapping to evaluate fistula and future access needs  ASSESSMENT/PLAN:  assessment    of Let AV fistula Pending vein mapping and duplex of left arm fistula She will use the right IJ for dialysis    Lars Mage 01/02/2014 12:44 PM  Patient had left brachial to cephalic AV fistula created in [place] by Dr. Sondra Come a few months ago. She was scheduled to followup in his office this week but because being in hospital was unable to do so. On examination the fistula is patent but is somewhat deep in the caliber of the vein may be small. Fistula duplex is pending.  I feel patient needs to return to Dr. Sondra Come for continued monitoring and further treatment of this fistula since he just performed a few months ago and is planning to see her in followup. She will be certain to make an appointment  with him following discharge from hospital for further planning. We'll see again as necessary

## 2014-01-02 NOTE — Care Management Note (Signed)
    Page 1 of 1   01/02/2014     3:04:18 PM CARE MANAGEMENT NOTE 01/02/2014  Patient:  Kathleen [name], Kathleen [name]   Account Number:  [id number]  Date Initiated:  01/02/2014  Documentation initiated by:  [facility]  Subjective/Objective Assessment:   63 y.o. woman with a pmhx of CAD s/p CABG ESRD on HD, DM on lantus BID who presented to the ED yesterday with a cc of low back/ buttock pain. Principal Problem:  NSTEMI (non-ST elevated myocardial infarction)//Home with spouse.     Action/Plan:   IV diureses, HD, medication adjustments. //Access for [facility] services.   Anticipated DC Date:  01/04/2014   Anticipated DC Plan:  HOME W HOME HEALTH SERVICES      DC Planning Services  CM consult      Choice offered to / List presented to:             Status of service:  In process, will continue to follow Medicare Important Message given?   (If response is "NO", the following Medicare IM given date fields will be blank) Date Medicare IM given:   Date Additional Medicare IM given:    Discharge Disposition:    Per UR Regulation:    If discussed at Long Length of Stay Meetings, dates discussed:    Comments:

## 2014-01-02 NOTE — H&P (Signed)
63 y/o female with PPMH of CAD s/p CABG, ESRD on HD, chronic systolic HF, DM presents with back pain s/p fall. History is difficult to obtain as patient has difficulty recalling the events. She states she was in her house and was walking with her walker when she lost her balance and fell backwards. She denies head trauma or LOC. No fevers or chills, no cp, no abd pain, no nausea or diarrhea. She was taken to another hospital and had x rays done and was sent home on pain medication. Patient took the pain meds and was confused (history from chart) over night. Her husband had reported that she had increase work of breathing overnight and he was unable to get her out of bed to go for HD. She was confused and in pain. He called HD and he was asked to call EMS and patient was brought to the hospital for further eval. In ED pt noted to have elevated troponins. Patient also noted to low O2 saturation. Remaining ROS negative  Cardiovascular- heart sounds normal, regular rate and rhythm, murmur + Lungs- bibasilar crackles + Extremities- no lower extremity edema Neuro- Alert, oriented, Abdomen- soft, non tender, non distended, bowel sounds + MSK- lower back pain+  Case d/w patient and Dr. Glendell Docker in detail   Acute on chronic systolic HF, NSTEMI - pt s/p HD last night with improvement in SOB - Cardio follow up appreciated. Pt for myocardial perfusion study today  - Troponins trending down - c/w beta blocker, asa. Would consider adding statin unless medically contraindicated  ESRD on HD - Nephro following. Continue with HD per nephro  Acute sacral fracture, non displaced fracture of acetabulum and superior pubic ramus - c/w pain control- morphine 1 mg IV q 4 hrs - PT/OT eval - would get ortho eval but unlikely surgical intervention required  Altered mental status - likely delirium secondary to pain meds and possibly from the pain itself - currently resolved. Will monitor

## 2014-01-02 NOTE — Progress Notes (Signed)
       Patient Name: Kathleen [name] Date of Encounter: 01/02/2014    SUBJECTIVE: The patient is somewhat confused and concerned about her glasses being loss. She states she has not eaten for 2 days. She denies chest pain and dyspnea.  TELEMETRY:  Normal sinus rhythm Filed Vitals:   01/01/14 2019 01/01/14 2113 01/02/14 0003 01/02/14 0543  BP: 98/52 117/61 134/84 102/59  Pulse: 96 88 89 75  Temp: 98.3 F (36.8 C) 98.7 F (37.1 C) 97.7 F (36.5 C) 97.8 F (36.6 C)  TempSrc: Oral Oral Axillary Oral  Resp: 18 18 20 18   Height:      Weight:    132 lb 4.8 oz (60.011 kg)  SpO2: 95% 99% 95% 96%    Intake/Output Summary (Last 24 hours) at 01/02/14 0921 Last data filed at 01/02/14 0844  Gross per 24 hour  Intake      0 ml  Output    850 ml  Net   -850 ml   LABS: Basic Metabolic Panel:  Recent Labs  77/41/42 1247 01/01/14 1333 01/02/14 0055  NA 135* 135* 135*  K 5.0 4.7 3.8  CL 93* 99 91*  CO2 20  --  24  GLUCOSE 206* 202* 140*  BUN 38* 37* 23  CREATININE 4.38* 4.70* 2.79*  CALCIUM 9.2  --  8.5   CBC:  Recent Labs  01/01/14 1247 01/01/14 1333 01/02/14 0055  WBC 14.0*  --  14.4*  NEUTROABS 12.0*  --   --   HGB 10.4* 12.9 10.4*  HCT 34.8* 38.0 34.3*  MCV 88.5  --  88.2  PLT 387  --  411*   Cardiac Enzymes:  Recent Labs  01/01/14 1247 01/02/14 0100 01/02/14 0630  TROPONINI 2.30* 2.30* 1.78*   Radiology/Studies:  Chest x-ray yesterday demonstrated prior CABG and pulmonary vascular congestion  Physical Exam: Blood pressure 102/59, pulse 75, temperature 97.8 F (36.6 C), temperature source Oral, resp. rate 18, height 5' 5.5" (1.664 m), weight 132 lb 4.8 oz (60.011 kg), SpO2 96.00%. Weight change:   Wt Readings from Last 3 Encounters:  01/02/14 132 lb 4.8 oz (60.011 kg)  11/16/13 142 lb 10.2 oz (64.7 kg)    S3 gallop No significant JVD No edema  ECG:  Normal sinus rhythm with rightward axis, question left posterior hemiblock  ASSESSMENT:  1.  Systolic heart failure, compensated after hemodialysis 2. Coronary artery disease with prior coronary bypass grafting and now elevated troponins. 3. Non-ST elevation MI, suspect type II 4. End-stage renal disease with dialysis requirement  Plan:  We'll proceed with plan as outlined yesterday by Dr. Teressa Lower with a pharmacologic myocardial perfusion study. He will be a least intermediate risk because of known LV dysfunction. The question is whether or not there is evidence of widespread ischemia.  Signed, Lyn Records III 01/02/2014, 9:21 AM

## 2014-01-02 NOTE — Progress Notes (Signed)
Subjective:  NAEON. Patients pain is better controlled. AMS resolved. Diaphoresis resolved.  Objective: Vital signs in last 24 hours: Filed Vitals:   01/01/14 2113 01/02/14 0003 01/02/14 0543 01/02/14 0900  BP: 117/61 134/84 102/59 100/55  Pulse: 88 89 75 77  Temp: 98.7 F (37.1 C) 97.7 F (36.5 C) 97.8 F (36.6 C) 97.5 F (36.4 C)  TempSrc: Oral Axillary Oral Oral  Resp: 18 20 18 18   Height:      Weight:   132 lb 4.8 oz (60.011 kg)   SpO2: 99% 95% 96% 99%   Weight change:   Intake/Output Summary (Last 24 hours) at 01/02/14 1057 Last data filed at 01/02/14 0844  Gross per 24 hour  Intake      0 ml  Output    850 ml  Net   -850 ml   Physical Exam  Constitutional: She is oriented to person, place, and time. She appears well-developed and well-nourished. No distress.  Cardiovascular: Normal rate, regular rhythm and intact distal pulses.   Murmur heard. Pulmonary/Chest: Effort normal and breath sounds normal. No respiratory distress. She has no wheezes. She has no rales.  Abdominal: Soft. Bowel sounds are normal. She exhibits no distension. There is tenderness. There is no rebound and no guarding.  Neurological: She is alert and oriented to person, place, and time.  Skin: She is not diaphoretic.  Psychiatric: She has a normal mood and affect. Her behavior is normal.    Lab Results: Basic Metabolic Panel:  Recent Labs Lab 01/01/14 1247 01/01/14 1333 01/02/14 0055  NA 135* 135* 135*  K 5.0 4.7 3.8  CL 93* 99 91*  CO2 20  --  24  GLUCOSE 206* 202* 140*  BUN 38* 37* 23  CREATININE 4.38* 4.70* 2.79*  CALCIUM 9.2  --  8.5   Liver Function Tests:  Recent Labs Lab 01/02/14 0055  AST 61*  ALT 30  ALKPHOS 146*  BILITOT 1.0  PROT 8.1  ALBUMIN 2.9*   CBC:  Recent Labs Lab 01/01/14 1247 01/01/14 1333 01/02/14 0055  WBC 14.0*  --  14.4*  NEUTROABS 12.0*  --   --   HGB 10.4* 12.9 10.4*  HCT 34.8* 38.0 34.3*  MCV 88.5  --  88.2  PLT 387  --  411*    Cardiac Enzymes:  Recent Labs Lab 01/01/14 1247 01/02/14 0100 01/02/14 0630  TROPONINI 2.30* 2.30* 1.78*   CBG:  Recent Labs Lab 01/01/14 1232 01/01/14 1834 01/01/14 2136 01/02/14 0615  GLUCAP 184* 128* 132* 113*   Micro Results: Recent Results (from the past 240 hour(s))  MRSA PCR SCREENING     Status: None   Collection Time    01/01/14  9:22 PM      Result Value Ref Range Status   MRSA by PCR NEGATIVE  NEGATIVE Final   Comment:            The GeneXpert MRSA Assay (FDA     approved for NASAL specimens     only), is one component of a     comprehensive MRSA colonization     surveillance program. It is not     intended to diagnose MRSA     infection nor to guide or     monitor treatment for     MRSA infections.   Studies/Results: Dg Sacrum/coccyx  01/01/2014   CLINICAL DATA:  Larey Seat, injury, pain  EXAM: SACRUM AND COCCYX - 2+ VIEW  COMPARISON:  None.  FINDINGS: Difficult to  exclude a mid sacral fracture, as there is slight angulation with suspected lucency through the S2 segment. Noncontrast CT scan of the pelvis recommended for further evaluation. Severe osteopenia limits evaluation. Furthermore moderate stool burden obscures much of the sacrum on the AP view. Previous ORIF for right hip fracture. Degenerative disc disease lumbar spine. Vascular calcification.  IMPRESSION: Cannot exclude mid S2 fracture. CT pelvis without contrast recommended.   Electronically Signed   By: Davonna BellingJohn  Curnes M.D.   On: 01/01/2014 14:07   Ct Pelvis Wo Contrast  01/01/2014   CLINICAL DATA:  Do a sacral fracture  EXAM: CT PELVIS WITHOUT CONTRAST  TECHNIQUE: Multidetector CT imaging of the pelvis was performed following the standard protocol without intravenous contrast.  COMPARISON:  None.  FINDINGS: There is no hip fracture or dislocation. There is orthopedic hardware in the right hip transfixing a healed intertrochanteric fracture. There is a nondisplaced fracture of the right inferior pubic ramus.  There is a nondisplaced fracture at the junction of the right superior acetabulum and superior pubic ramus.  There is a fracture of the S2 vertebral body with mild focal kyphosis. The fracture extends into the right sacral ala. There is a small amount of hemorrhagic fluid anterior to the right piriformis muscle.  There is severe degenerative disc disease with broad-based disc bulge at L4-5.  There is a small fat containing umbilical hernia. There is a small amount of pelvic free fluid. There is abdominal aortic atherosclerosis. There is a Foley catheter within the bladder.  IMPRESSION: 1. S2 vertebral body fracture with focal kyphosis centered at S2. The fracture extends into the right sacral ala. 2. Nondisplaced fracture of the right inferior pubic ramus. 3. Nondisplaced fracture at the junction of the right superior acetabulum and superior pubic ramus.   Electronically Signed   By: Elige KoHetal  Patel   On: 01/01/2014 23:47   Dg Chest Port 1 View  01/01/2014   CLINICAL DATA:  Shortness of breath and dialysis patient. History of CHF.  EXAM: PORTABLE CHEST - 1 VIEW  COMPARISON:  Chest radiograph 11/13/2013 and 09/04/2013  FINDINGS: Stable cardiomegaly in this patient status post median sternotomy and CABG. The superior most sternotomy wire is fractured, unchanged from chest radiograph dated 11/13/2013. There is mild diffuse pulmonary vascular congestion. Negative for discrete pulmonary edema. Surgical suture is seen in the region of the left upper lung, laterally.  There is irregularity and probable callus formation involving 3 left-sided ribs, suggesting subacute or remote rib fractures. Negative for pneumothorax or pleural effusion.  IMPRESSION: Cardiomegaly and pulmonary vascular congestion.  Prior CABG.  Satisfactory position of right IJ dialysis catheter.  Approximately 3 left-sided rib fractures suspected, subacute to remote.   Electronically Signed   By: Britta MccreedySusan  Turner M.D.   On: 01/01/2014 13:27   Medications: I  have reviewed the patient's current medications. Scheduled Meds: . ARIPiprazole  5 mg Oral Daily  . aspirin EC  81 mg Oral Daily  . budesonide-formoterol  2 puff Inhalation BID  . carvedilol  6.25 mg Oral BID WC  . citalopram  10 mg Oral Daily  . heparin  20 Units/kg Dialysis Once in dialysis  . insulin aspart  0-9 Units Subcutaneous TID WC  . pantoprazole  40 mg Oral Daily  . sodium chloride  3 mL Intravenous Q12H   Continuous Infusions:  PRN Meds:.sodium chloride, sodium chloride, acetaminophen, acetaminophen, feeding supplement (NEPRO CARB STEADY), heparin, ipratropium-albuterol, lidocaine (PF), lidocaine-prilocaine, morphine injection, pentafluoroprop-tetrafluoroeth Assessment/Plan: Principal Problem:   NSTEMI (non-ST  elevated myocardial infarction) Active Problems:   ESRD on dialysis   Diabetes   Acute on chronic combined systolic and diastolic heart failure, NYHA class 4   Severe mitral regurgitation   CCF (congestive cardiac failure)   Sacrum and coccyx fracture   Altered mental state   Chronic systolic heart failure  NSTEMI in setting of severe sCHF and severe MR  The patients NSTEMI is likely due to known CAD and volume overload causing hypoxia in. She has a complicated cardiac history with severe sCHF (EF 20% by echo in march 2015). She had CABG previously. She also has structural heart disease of severe MR and mild to moderate AR. Troponin was elevated at 2.30, remained at 2.3 after HD and is now downtrending. - Appreciate card's recs, consulted by ED  - ASA, BB (coreg 6.25 mg BID) heparin drip  - SDU on tele and continuous pulse Oximetry  - F/U nuclear stress per cards  Sacral and pelvic Fractures The patient has sacral and pevlic fractures. Patient became altered when given 4 mg IV morphine in the ED. Patient is on oxy IR 5 mg q 4 hr prn at home. Patient had good pain control with IV morphine 1 mg q 4 hr prn overnight. Mental status much improved. - Consulted Ortho,  appreciate rec's - PT OT eval and treat  - Senokot   ESRD  Patient appeared to have fluid overload that contributed to her hypoxic respiratory failure. HD per nephrology. Acute heart failure appears to have resolved.  COPD  Patient appears to have stable COPD. However, hypoxia is likely multifactorial and COPD may be contributing.  - duonebs (xopenex) q 6 h prn  - Continue home Symbicort   DM  On lantus 25 U BID  SSI   Diet: Heart carb mod  DVT:   Dispo: Disposition is deferred at this time, awaiting improvement of current medical problems.  Anticipated discharge in approximately 1-2 day(s).   The patient does have a current PCP (No Pcp Per Patient) and does not need an [facility] hospital follow-up appointment after discharge.  The patient does have transportation limitations that hinder transportation to clinic appointments.  .Services Needed at time of discharge: Y = Yes, Blank = No PT:   OT:   RN:   Equipment:   Other:     LOS: 1 day   Pleas Koch, MD 01/02/2014, 10:57 AM

## 2014-01-02 NOTE — Progress Notes (Signed)
Patient ID: Kathleen [name], female   DOB: 1950-11-10, 63 y.o.   MRN: [id number] Came to evaluate the patient-not available in nuclear medicine for stress test the rest of today  Will evaluate in a.m. Patient needs duplex scan of left upper arm AV fistula 2 look at caliber of vein and any other abnormalities

## 2014-01-02 NOTE — Progress Notes (Signed)
  [name] Progress Note   Subjective: Pain in back is a little better, still "pretty sore".  No CP or SOB today.  Filed Vitals:   01/01/14 2113 01/02/14 0003 01/02/14 0543 01/02/14 0900  BP: 117/61 134/84 102/59 100/55  Pulse: 88 89 75 77  Temp: 98.7 F (37.1 C) 97.7 F (36.5 C) 97.8 F (36.6 C) 97.5 F (36.4 C)  TempSrc: Oral Axillary Oral Oral  Resp: 18 20 18 18   Height:      Weight:   60.011 kg (132 lb 4.8 oz)   SpO2: 99% 95% 96% 99%   Exam: Frail adult female, no distress No jvd Chest occ crackles at R base, L clear RRR 2/6 SEM no RG Abd soft, NTND Trace LE edema R IJ Cath, LUA AVF +bruit , not maturing very well Neuro is nf, gen weakness, mild tremors, Ox3  HD: [facility] 4h   64kg   2/2.0 Bath   Heparin 6000    IJ Cath (maturing LUA AVF) EPO 5800         Assessment: 1 Fall / back pain / sacral and pelvic fractures by CT- per primary 2 NSTEMI / hx CABG / Ischemic CM EF 20% 3 ESRD on HD- started HD Oct 2014 4 Anemia Hb ok, hold epo 5 HTPH pth normal, no binders or vit D 6 DM on insulin 7 Access- has IJ cath and L arm AVF; fistula followed by surgeon at [facility] and had appt there this week per pts husband to assess the fistula. Doesn't look very strong. Family is agreeable to have surgeon here evaluate  Plan- HD tomorrow, called [facility] to eval AVF while pt is here    Vinson Moselle MD  pager [phone]    cell [phone]  01/02/2014, 11:20 AM     Recent Labs Lab 01/01/14 1247 01/01/14 1333 01/02/14 0055  NA 135* 135* 135*  K 5.0 4.7 3.8  CL 93* 99 91*  CO2 20  --  24  GLUCOSE 206* 202* 140*  BUN 38* 37* 23  CREATININE 4.38* 4.70* 2.79*  CALCIUM 9.2  --  8.5    Recent Labs Lab 01/02/14 0055  AST 61*  ALT 30  ALKPHOS 146*  BILITOT 1.0  PROT 8.1  ALBUMIN 2.9*    Recent Labs Lab 01/01/14 1247 01/01/14 1333 01/02/14 0055  WBC 14.0*  --  14.4*  NEUTROABS 12.0*  --   --   HGB 10.4* 12.9 10.4*  HCT 34.8* 38.0  34.3*  MCV 88.5  --  88.2  PLT 387  --  411*   . ARIPiprazole  5 mg Oral Daily  . aspirin EC  81 mg Oral Daily  . budesonide-formoterol  2 puff Inhalation BID  . carvedilol  6.25 mg Oral BID WC  . citalopram  10 mg Oral Daily  . heparin  20 Units/kg Dialysis Once in dialysis  . insulin aspart  0-9 Units Subcutaneous TID WC  . pantoprazole  40 mg Oral Daily  . sodium chloride  3 mL Intravenous Q12H     sodium chloride, sodium chloride, acetaminophen, acetaminophen, feeding supplement (NEPRO CARB STEADY), heparin, ipratropium-albuterol, lidocaine (PF), lidocaine-prilocaine, morphine injection, pentafluoroprop-tetrafluoroeth

## 2014-01-03 DIAGNOSIS — X58XXXA Exposure to other specified factors, initial encounter: Secondary | ICD-10-CM

## 2014-01-03 DIAGNOSIS — N390 Urinary tract infection, site not specified: Secondary | ICD-10-CM

## 2014-01-03 DIAGNOSIS — S322XXA Fracture of coccyx, initial encounter for closed fracture: Secondary | ICD-10-CM

## 2014-01-03 DIAGNOSIS — S3210XA Unspecified fracture of sacrum, initial encounter for closed fracture: Secondary | ICD-10-CM

## 2014-01-03 DIAGNOSIS — S329XXA Fracture of unspecified parts of lumbosacral spine and pelvis, initial encounter for closed fracture: Secondary | ICD-10-CM

## 2014-01-03 LAB — RENAL FUNCTION PANEL
ALBUMIN: 2.3 g/dL — AB (ref 3.5–5.2)
BUN: 42 mg/dL — AB (ref 6–23)
CHLORIDE: 95 meq/L — AB (ref 96–112)
CO2: 26 mEq/L (ref 19–32)
Calcium: 8.5 mg/dL (ref 8.4–10.5)
Creatinine, Ser: 3.39 mg/dL — ABNORMAL HIGH (ref 0.50–1.10)
GFR, EST AFRICAN AMERICAN: 15 mL/min — AB (ref >90)
GFR, EST NON AFRICAN AMERICAN: 13 mL/min — AB (ref >90)
Glucose, Bld: 118 mg/dL — ABNORMAL HIGH (ref 70–99)
PHOSPHORUS: 5 mg/dL — AB (ref 2.3–4.6)
POTASSIUM: 3.9 meq/L (ref 3.7–5.3)
SODIUM: 136 meq/L — AB (ref 137–147)

## 2014-01-03 LAB — CBC WITH DIFFERENTIAL/PLATELET
Basophils Absolute: 0 10*3/uL (ref 0.0–0.1)
Basophils Relative: 0 % (ref 0–1)
EOS PCT: 4 % (ref 0–5)
Eosinophils Absolute: 0.5 10*3/uL (ref 0.0–0.7)
HCT: 30.1 % — ABNORMAL LOW (ref 36.0–46.0)
Hemoglobin: 9.3 g/dL — ABNORMAL LOW (ref 12.0–15.0)
Lymphocytes Relative: 9 % — ABNORMAL LOW (ref 12–46)
Lymphs Abs: 1 10*3/uL (ref 0.7–4.0)
MCH: 26.8 pg (ref 26.0–34.0)
MCHC: 30.9 g/dL (ref 30.0–36.0)
MCV: 86.7 fL (ref 78.0–100.0)
MONOS PCT: 7 % (ref 3–12)
Monocytes Absolute: 0.8 10*3/uL (ref 0.1–1.0)
NEUTROS PCT: 80 % — AB (ref 43–77)
Neutro Abs: 9.1 10*3/uL — ABNORMAL HIGH (ref 1.7–7.7)
Platelets: 398 10*3/uL (ref 150–400)
RBC: 3.47 MIL/uL — AB (ref 3.87–5.11)
RDW: 18.7 % — ABNORMAL HIGH (ref 11.5–15.5)
WBC: 11.4 10*3/uL — AB (ref 4.0–10.5)

## 2014-01-03 LAB — CBC
HCT: 29.9 % — ABNORMAL LOW (ref 36.0–46.0)
Hemoglobin: 9.2 g/dL — ABNORMAL LOW (ref 12.0–15.0)
MCH: 26.8 pg (ref 26.0–34.0)
MCHC: 30.8 g/dL (ref 30.0–36.0)
MCV: 87.2 fL (ref 78.0–100.0)
Platelets: 346 10*3/uL (ref 150–400)
RBC: 3.43 MIL/uL — ABNORMAL LOW (ref 3.87–5.11)
RDW: 18.9 % — AB (ref 11.5–15.5)
WBC: 11.1 10*3/uL — ABNORMAL HIGH (ref 4.0–10.5)

## 2014-01-03 LAB — GLUCOSE, CAPILLARY
GLUCOSE-CAPILLARY: 84 mg/dL (ref 70–99)
GLUCOSE-CAPILLARY: 109 mg/dL — AB (ref 70–99)
Glucose-Capillary: 84 mg/dL (ref 70–99)
Glucose-Capillary: 142 mg/dL — ABNORMAL HIGH (ref 70–99)

## 2014-01-03 LAB — TROPONIN I
Troponin I: 1.64 ng/mL (ref <0.30)
Troponin I: 1 ng/mL (ref <0.30)

## 2014-01-03 MED ORDER — PENTAFLUOROPROP-TETRAFLUOROETH EX AERO: 1.0000 "application " | INHALATION_SPRAY | CUTANEOUS | Status: DC | PRN

## 2014-01-03 MED ORDER — LIDOCAINE HCL (PF) 1 % IJ SOLN: 5.0000 mL | INTRAMUSCULAR | Status: DC | PRN

## 2014-01-03 MED ORDER — HEPARIN SODIUM (PORCINE) 1000 UNIT/ML DIALYSIS
6000.0000 [IU] | Freq: Once | INTRAMUSCULAR | Status: DC
  Filled 2014-01-03: qty 6

## 2014-01-03 MED ORDER — SODIUM CHLORIDE 0.9 % IV SOLN: 100.0000 mL | INTRAVENOUS | Status: DC | PRN

## 2014-01-03 MED ORDER — ARIPIPRAZOLE 5 MG PO TABS
5.0000 mg | ORAL_TABLET | Freq: Every day | ORAL | Status: DC
  Administered 2014-01-04 – 2014-01-06 (×3): 5 mg via ORAL
  Filled 2014-01-03 (×4): qty 1

## 2014-01-03 MED ORDER — NEPRO/CARBSTEADY PO LIQD
237.0000 mL | ORAL | Status: DC | PRN
  Filled 2014-01-03: qty 237

## 2014-01-03 MED ORDER — OXYCODONE-ACETAMINOPHEN 5-325 MG PO TABS
1.0000 | ORAL_TABLET | Freq: Four times a day (QID) | ORAL | Status: DC | PRN
  Administered 2014-01-03 – 2014-01-07 (×14): 2 via ORAL
  Filled 2014-01-03 (×13): qty 2

## 2014-01-03 MED ORDER — ALTEPLASE 2 MG IJ SOLR
2.0000 mg | Freq: Once | INTRAMUSCULAR | Status: AC | PRN
  Filled 2014-01-03: qty 2

## 2014-01-03 MED ORDER — HEPARIN SODIUM (PORCINE) 5000 UNIT/ML IJ SOLN
5000.0000 [IU] | Freq: Three times a day (TID) | INTRAMUSCULAR | Status: DC
  Administered 2014-01-03 – 2014-01-07 (×10): 5000 [IU] via SUBCUTANEOUS
  Filled 2014-01-03 (×12): qty 1

## 2014-01-03 MED ORDER — LIDOCAINE-PRILOCAINE 2.5-2.5 % EX CREA
1.0000 "application " | TOPICAL_CREAM | CUTANEOUS | Status: DC | PRN
  Filled 2014-01-03: qty 5

## 2014-01-03 MED ORDER — HEPARIN SODIUM (PORCINE) 1000 UNIT/ML DIALYSIS
1000.0000 [IU] | INTRAMUSCULAR | Status: DC | PRN
  Filled 2014-01-03: qty 1

## 2014-01-03 NOTE — Progress Notes (Signed)
Bladder scanned patient upon return from dialysis.  Bladder scan showed 93mL and patient denies any urge to void.  MD on call notified.  Will continue to monitor.

## 2014-01-03 NOTE — Progress Notes (Signed)
Clinical Social Work Department BRIEF PSYCHOSOCIAL ASSESSMENT 01/03/2014  Patient:  [name], Kathleen [name]     Account Number:  [id number]     Admit date:  01/01/2014  Clinical Social Worker:  Varney Biles  Date/Time:  01/03/2014 04:10 PM  Referred by:  Physician  Date Referred:  01/03/2014 Referred for  SNF Placement   Other Referral:   Interview type:  Other - See comment Other interview type:   Spoke on phone with RN, who relayed message from husband that pt wanted [facility] for SNF    PSYCHOSOCIAL DATA Living Status:  FAMILY Admitted from facility:   Level of care:   Primary support name:  [name] ([phone]) Primary support relationship to patient:  SPOUSE Degree of support available:   Good--pt lives at home with spouse.    CURRENT CONCERNS Current Concerns  Post-Acute Placement   Other Concerns:    SOCIAL WORK ASSESSMENT / PLAN RN called stating plan will be for SNF. PT/OT have not evaluated pt yet, but MD recommending this and PT/OT to see pt. Pt has been to [facility] in the past, and husband states he would like her to return. CSW made referral to [facility] and will follow up with husband/pt.   Assessment/plan status:  Psychosocial Support/Ongoing Assessment of Needs Other assessment/ plan:   Information/referral to community resources:   [facility]?    PATIENT'S/FAMILY'S RESPONSE TO PLAN OF CARE: N/A--CSW to follow up       Maryclare Labrador, MSW, [facility] Clinical Social Worker [phone]

## 2014-01-03 NOTE — Progress Notes (Signed)
Patient seen and examined. Still with persistent back pain. No CP, no sob, no abd pain   Cardiovascular- heart sounds normal, regular rate and rhythm, murmur +  Lungs- bibasilar crackles resolving  Extremities- no lower extremity edema  Neuro- Alert, oriented,  Abdomen- soft, non tender, non distended, bowel sounds +  MSK- lower back pain+   Case d/w patient and Dr. Glendell Docker in detail   Acute on chronic systolic HF, NSTEMI   - Cardio follow up noted. No further evaluation at this time based on stress test - c/w beta blocker, asa, statin - patient with mild hypotension yesterday afternoon. Currently resolved. Will monitor Likely secondary to pain meds   ESRD on HD  - Nephro following. Continue with HD per nephro  - Vascular follow up noted. No intervention for AVF at this time. Outpatient follow up with Dr. Sondra Come  Acute sacral fracture, non displaced fracture of acetabulum and superior pubic ramus  - c/w pain control  - PT/OT eval today. I suspect she will need rehab placement -  Patient ok for weight bearing per ortho.   Altered mental status  - likely delirium secondary to pain meds and possibly from the pain itself  - currently resolved. Will monitor  Possible UTI - follow up urine culture - On ceftriaxone day 2. Would d/c and start PO antibiotics (likely ceftin)   Anemia - possibly secondary to ESRD but given decreased Hg to 9's with baseline in 10's /11's would check repeat today - If continues to worsen would get CT abd/pelvis without contrast to rule out bleed around fracture sites

## 2014-01-03 NOTE — Plan of Care (Signed)
Problem: Phase I Progression Outcomes Goal: Voiding-avoid urinary catheter unless indicated Outcome: Completed/Met Date Met:  01/03/14 Indwelling catheter d/c

## 2014-01-03 NOTE — Progress Notes (Signed)
PT Cancellation Note  Patient Details Name: [name] MRN: [id number] DOB: May 18, 1951   Cancelled Treatment:    Reason Eval Not Completed: Patient at procedure/out of room.   Scherrie November Vitoria Conyer 01/03/2014, 4:21 PM Pager [phone]

## 2014-01-03 NOTE — Progress Notes (Signed)
  [facility] Progress Note   Subjective: No complaints  Filed Vitals:   01/03/14 0056 01/03/14 0621 01/03/14 0921 01/03/14 0954  BP: 103/53 129/51  113/51  Pulse: 79 84  80  Temp:  97.3 F (36.3 C)    TempSrc:  Oral    Resp: 18 18  18   Height:      Weight:  60.102 kg (132 lb 8 oz)    SpO2: 100% 97% 97% 97%   Exam: No distress No jvd Chest occ crackles at R base, L clear RRR 2/6 SEM no RG Abd soft, NTND Trace LE edema R IJ Cath, LUA AVF +bruit , not maturing very well Neuro is nf, Ox2  HD: [facility] 4h   64kg   2/2.0 Bath   Heparin 6000    IJ Cath (maturing LUA AVF) EPO 5800         Assessment: 1 Fall / back pain / sacral and pelvic fractures by CT- per primary 2 NSTEMI / hx CABG / Ischemic CM EF 20%- negative myoview for ischemia 3 ESRD on HD- started HD Oct 2014 4 Anemia Hb ok, hold epo 5 HTPH pth normal, no binders or vit D 6 DM on insulin 7 Access- using Diatek, pt will f/u w Dr Sondra Come in [facility] for AVF maturation after D/C  Plan- HD today    Vinson Moselle MD  pager [phone]    cell [phone]  01/03/2014, 10:46 AM     Recent Labs Lab 01/01/14 1247 01/01/14 1333 01/02/14 0055  NA 135* 135* 135*  K 5.0 4.7 3.8  CL 93* 99 91*  CO2 20  --  24  GLUCOSE 206* 202* 140*  BUN 38* 37* 23  CREATININE 4.38* 4.70* 2.79*  CALCIUM 9.2  --  8.5    Recent Labs Lab 01/02/14 0055  AST 61*  ALT 30  ALKPHOS 146*  BILITOT 1.0  PROT 8.1  ALBUMIN 2.9*    Recent Labs Lab 01/01/14 1247 01/01/14 1333 01/02/14 0055 01/03/14 0427  WBC 14.0*  --  14.4* 11.1*  NEUTROABS 12.0*  --   --   --   HGB 10.4* 12.9 10.4* 9.2*  HCT 34.8* 38.0 34.3* 29.9*  MCV 88.5  --  88.2 87.2  PLT 387  --  411* 346   . [START ON 01/04/2014] ARIPiprazole  5 mg Oral QHS  . aspirin EC  81 mg Oral Daily  . atorvastatin  80 mg Oral q1800  . budesonide-formoterol  2 puff Inhalation BID  . carvedilol  6.25 mg Oral BID WC  . cefTRIAXone (ROCEPHIN)  IV  1 g Intravenous  Q24H  . citalopram  10 mg Oral Daily  . heparin  20 Units/kg Dialysis Once in dialysis  . insulin aspart  0-9 Units Subcutaneous TID WC  . pantoprazole  40 mg Oral Daily  . sodium chloride  3 mL Intravenous Q12H     sodium chloride, sodium chloride, acetaminophen, acetaminophen, bisacodyl, feeding supplement (NEPRO CARB STEADY), heparin, ipratropium-albuterol, lidocaine (PF), lidocaine-prilocaine, morphine injection, oxyCODONE-acetaminophen, pentafluoroprop-tetrafluoroeth

## 2014-01-03 NOTE — Plan of Care (Signed)
Problem: Phase I Progression Outcomes Goal: Voiding-avoid urinary catheter unless indicated Outcome: Completed/Met Date Met:  01/03/14 Indwelling catheter removed

## 2014-01-03 NOTE — Progress Notes (Signed)
Foley removed per order. RN will continue to monitor. Louretta Parma, RN

## 2014-01-03 NOTE — Progress Notes (Signed)
Clinical Social Work Department CLINICAL SOCIAL WORK PLACEMENT NOTE 01/03/2014  Patient:  [name], Kathleen [name]  Account Number:  [id number] Admit date:  01/01/2014  Clinical Social Worker:  Maryclare Labrador, Theresia Majors  Date/time:  01/03/2014 04:12 PM  Clinical Social Work is seeking post-discharge placement for this patient at the following level of care:   SKILLED NURSING   (*CSW will update this form in Epic as items are completed)   N/A-husband requesting [facility] SN[facility]  Patient/family provided with [facility] Department of Clinical Social Work's list of facilities offering this level of care within the geographic area requested by the patient (or if unable, by the patient's family).  01/03/2014  Patient/family informed of their freedom to choose among providers that offer the needed level of care, that participate in Medicare, Medicaid or managed care program needed by the patient, have an available bed and are willing to accept the patient.  N/A-not sent to this facility  Patient/family informed of [facility]' ownership interest in [facility], as well as of the fact that they are under no obligation to receive care at this facility.  PASARR submitted to EDS on EXISTING PASARR number received from E[employer] on EXISTING  FL2 transmitted to all facilities in geographic area requested by pt/family on  01/03/2014 FL2 transmitted to all facilities within larger geographic area on   Patient informed that his/her managed care company has contracts with or will negotiate with  certain facilities, including the following:     Patient/family informed of bed offers received:   Patient chooses bed at  Physician recommends and patient chooses bed at    Patient to be transferred to  on   Patient to be transferred to facility by   The following physician request were entered in Epic:   Additional Comments:   Maryclare Labrador, MSW, [facility] Clinical Social Worker [phone]

## 2014-01-03 NOTE — Progress Notes (Signed)
As this morning to see the patient regarding her sacral fracture.  I reviewed her CAT scan online and the treatment for this would be weightbearing as tolerated with a walker.  There seems to be some confusion as to whether the patient was seen in consult  yesterday.  I will plan on seeing her at lunchtime and leave the full evaluation and recommendations, but I wanted to make sure that it was clear that she was fine to be walking full weightbearing at this point.

## 2014-01-03 NOTE — Consult Note (Signed)
Reason for Consult:Fracture of the pelvis Referring Physician: Internal medicine teaching service  CAPTOLA TESCHNER is an 63 y.o. female.  HPI: The patient is 63 year old female with a history of having had open reduction internal fixation of left hip fracture who took a fall several days ago.  She is noted on CAT scan to have fractures of the pelvis and fractures of the pubic rami.  We are consult for management of these fractures.  The patient states that she is unable to walk at this time and she's having significant pain in the left side of the low back.  She initially had some numbness in the left leg that seems to resolve.  Past Medical History  Diagnosis Date  . Chronic kidney disease   . Diabetes mellitus without complication   . Hypertension   . COPD (chronic obstructive pulmonary disease)   . Ischemic cardiomyopathy   . Anemia     Past Surgical History  Procedure Laterality Date  . Knee cartilage surgery Left 2007  . Video assisted thoracoscopy (vats)/ lymph node sampling Left 2007  . Incontinence surgery  ?2006  . Coronary artery bypass graft  2010    x 1    No family history on file.  Social History:  reports that she quit smoking about 5 years ago. She has never used smokeless tobacco. She reports that she does not drink alcohol or use illicit drugs.  Allergies:  Allergies  Allergen Reactions  . Levaquin [Levofloxacin In D5w] Other (See Comments)    Unknown   . Metformin And Related Other (See Comments)    Unknown   . Other     Mycins allergy   . Sulfa Antibiotics Other (See Comments)    Unknown   . Tobradex [Tobramycin-Dexamethasone] Other (See Comments)    Unknown     Medications: I have reviewed the patient's current medications.  Results for orders placed during the hospital encounter of 01/01/14 (from the past 48 hour(s))  CBG MONITORING, ED     Status: Abnormal   Collection Time    01/01/14  6:34 PM      Result Value Ref Range   Glucose-Capillary  128 (*) 70 - 99 mg/dL  MRSA PCR SCREENING     Status: None   Collection Time    01/01/14  9:22 PM      Result Value Ref Range   MRSA by PCR NEGATIVE  NEGATIVE   Comment:            The GeneXpert MRSA Assay (FDA     approved for NASAL specimens     only), is one component of a     comprehensive MRSA colonization     surveillance program. It is not     intended to diagnose MRSA     infection nor to guide or     monitor treatment for     MRSA infections.  GLUCOSE, CAPILLARY     Status: Abnormal   Collection Time    01/01/14  9:36 PM      Result Value Ref Range   Glucose-Capillary 132 (*) 70 - 99 mg/dL  CBC     Status: Abnormal   Collection Time    01/02/14 12:55 AM      Result Value Ref Range   WBC 14.4 (*) 4.0 - 10.5 K/uL   RBC 3.89  3.87 - 5.11 MIL/uL   Hemoglobin 10.4 (*) 12.0 - 15.0 g/dL   Comment: REPEATED TO VERIFY  HCT 34.3 (*) 36.0 - 46.0 %   MCV 88.2  78.0 - 100.0 fL   MCH 26.7  26.0 - 34.0 pg   MCHC 30.3  30.0 - 36.0 g/dL   RDW 18.7 (*) 11.5 - 15.5 %   Platelets 411 (*) 150 - 400 K/uL  COMPREHENSIVE METABOLIC PANEL     Status: Abnormal   Collection Time    01/02/14 12:55 AM      Result Value Ref Range   Sodium 135 (*) 137 - 147 mEq/L   Potassium 3.8  3.7 - 5.3 mEq/L   Comment: DELTA CHECK NOTED   Chloride 91 (*) 96 - 112 mEq/L   CO2 24  19 - 32 mEq/L   Glucose, Bld 140 (*) 70 - 99 mg/dL   BUN 23  6 - 23 mg/dL   Comment: DELTA CHECK NOTED   Creatinine, Ser 2.79 (*) 0.50 - 1.10 mg/dL   Comment: DELTA CHECK NOTED   Calcium 8.5  8.4 - 10.5 mg/dL   Total Protein 8.1  6.0 - 8.3 g/dL   Albumin 2.9 (*) 3.5 - 5.2 g/dL   AST 61 (*) 0 - 37 U/L   ALT 30  0 - 35 U/L   Alkaline Phosphatase 146 (*) 39 - 117 U/L   Total Bilirubin 1.0  0.3 - 1.2 mg/dL   GFR calc non Af Amer 17 (*) >90 mL/min   GFR calc Af Amer 20 (*) >90 mL/min   Comment: (NOTE)     The eGFR has been calculated using the CKD EPI equation.     This calculation has not been validated in all clinical  situations.     eGFR's persistently <90 mL/min signify possible Chronic Kidney     Disease.  TROPONIN I     Status: Abnormal   Collection Time    01/02/14  1:00 AM      Result Value Ref Range   Troponin I 2.30 (*) <0.30 ng/mL   Comment:            Due to the release kinetics of cTnI,     a negative result within the first hours     of the onset of symptoms does not rule out     myocardial infarction with certainty.     If myocardial infarction is still suspected,     repeat the test at appropriate intervals.     CRITICAL VALUE NOTED.  VALUE IS CONSISTENT WITH PREVIOUSLY REPORTED AND CALLED VALUE.  GLUCOSE, CAPILLARY     Status: Abnormal   Collection Time    01/02/14  6:15 AM      Result Value Ref Range   Glucose-Capillary 113 (*) 70 - 99 mg/dL  TROPONIN I     Status: Abnormal   Collection Time    01/02/14  6:30 AM      Result Value Ref Range   Troponin I 1.78 (*) <0.30 ng/mL   Comment:            Due to the release kinetics of cTnI,     a negative result within the first hours     of the onset of symptoms does not rule out     myocardial infarction with certainty.     If myocardial infarction is still suspected,     repeat the test at appropriate intervals.     CRITICAL VALUE NOTED.  VALUE IS CONSISTENT WITH PREVIOUSLY REPORTED AND CALLED VALUE.  GLUCOSE, CAPILLARY  Status: None   Collection Time    01/02/14 11:08 AM      Result Value Ref Range   Glucose-Capillary 88  70 - 99 mg/dL   Comment 1 Notify RN     Comment 2 Documented in Chart    GLUCOSE, CAPILLARY     Status: None   Collection Time    01/02/14  4:30 PM      Result Value Ref Range   Glucose-Capillary 98  70 - 99 mg/dL   Comment 1 Notify RN     Comment 2 Documented in Chart    TROPONIN I     Status: Abnormal   Collection Time    01/02/14  7:02 PM      Result Value Ref Range   Troponin I 1.53 (*) <0.30 ng/mL   Comment:            Due to the release kinetics of cTnI,     a negative result within the  first hours     of the onset of symptoms does not rule out     myocardial infarction with certainty.     If myocardial infarction is still suspected,     repeat the test at appropriate intervals.     CRITICAL VALUE NOTED.  VALUE IS CONSISTENT WITH PREVIOUSLY REPORTED AND CALLED VALUE.  URINALYSIS, ROUTINE W REFLEX MICROSCOPIC     Status: Abnormal   Collection Time    01/02/14  8:15 PM      Result Value Ref Range   Color, Urine RED (*) YELLOW   Comment: BIOCHEMICALS MAY BE AFFECTED BY COLOR   APPearance TURBID (*) CLEAR   Specific Gravity, Urine 1.028  1.005 - 1.030   pH 5.0  5.0 - 8.0   Glucose, UA NEGATIVE  NEGATIVE mg/dL   Hgb urine dipstick LARGE (*) NEGATIVE   Bilirubin Urine MODERATE (*) NEGATIVE   Ketones, ur 15 (*) NEGATIVE mg/dL   Protein, ur 100 (*) NEGATIVE mg/dL   Urobilinogen, UA 1.0  0.0 - 1.0 mg/dL   Nitrite POSITIVE (*) NEGATIVE   Leukocytes, UA LARGE (*) NEGATIVE  URINE MICROSCOPIC-ADD ON     Status: Abnormal   Collection Time    01/02/14  8:15 PM      Result Value Ref Range   Squamous Epithelial / LPF FEW (*) RARE   WBC, UA TOO NUMEROUS TO COUNT  <3 WBC/hpf   RBC / HPF 11-20  <3 RBC/hpf   Bacteria, UA MANY (*) RARE   Casts HYALINE CASTS (*) NEGATIVE   Urine-Other MANY YEAST    GLUCOSE, CAPILLARY     Status: Abnormal   Collection Time    01/02/14  9:32 PM      Result Value Ref Range   Glucose-Capillary 122 (*) 70 - 99 mg/dL   Comment 1 Notify RN     Comment 2 Documented in Chart    CULTURE, BLOOD (ROUTINE X 2)     Status: None   Collection Time    01/02/14 11:40 PM      Result Value Ref Range   Specimen Description BLOOD RIGHT ANTECUBITAL     Special Requests BOTTLES DRAWN AEROBIC AND ANAEROBIC 10CC     Culture PENDING     Report Status PENDING    TROPONIN I     Status: Abnormal   Collection Time    01/03/14 12:16 AM      Result Value Ref Range   Troponin I 1.64 (*) <0.30 ng/mL  Comment:            Due to the release kinetics of cTnI,     a  negative result within the first hours     of the onset of symptoms does not rule out     myocardial infarction with certainty.     If myocardial infarction is still suspected,     repeat the test at appropriate intervals.     CRITICAL VALUE NOTED.  VALUE IS CONSISTENT WITH PREVIOUSLY REPORTED AND CALLED VALUE.  CBC     Status: Abnormal   Collection Time    01/03/14  4:27 AM      Result Value Ref Range   WBC 11.1 (*) 4.0 - 10.5 K/uL   Comment: WHITE COUNT CONFIRMED ON SMEAR   RBC 3.43 (*) 3.87 - 5.11 MIL/uL   Hemoglobin 9.2 (*) 12.0 - 15.0 g/dL   Comment: REPEATED TO VERIFY   HCT 29.9 (*) 36.0 - 46.0 %   MCV 87.2  78.0 - 100.0 fL   MCH 26.8  26.0 - 34.0 pg   MCHC 30.8  30.0 - 36.0 g/dL   RDW 18.9 (*) 11.5 - 15.5 %   Platelets 346  150 - 400 K/uL  GLUCOSE, CAPILLARY     Status: None   Collection Time    01/03/14  5:50 AM      Result Value Ref Range   Glucose-Capillary 84  70 - 99 mg/dL  GLUCOSE, CAPILLARY     Status: Abnormal   Collection Time    01/03/14 10:50 AM      Result Value Ref Range   Glucose-Capillary 142 (*) 70 - 99 mg/dL    Dg Sacrum/coccyx  01/01/2014   CLINICAL DATA:  [place], injury, pain  EXAM: SACRUM AND COCCYX - 2+ VIEW  COMPARISON:  None.  FINDINGS: Difficult to exclude a mid sacral fracture, as there is slight angulation with suspected lucency through the S2 segment. Noncontrast CT scan of the pelvis recommended for further evaluation. Severe osteopenia limits evaluation. Furthermore moderate stool burden obscures much of the sacrum on the AP view. Previous ORIF for right hip fracture. Degenerative disc disease lumbar spine. Vascular calcification.  IMPRESSION: Cannot exclude mid S2 fracture. CT pelvis without contrast recommended.   Electronically Signed   By: Rolla Flatten M.D.   On: 01/01/2014 14:07   Ct Pelvis Wo Contrast  01/01/2014   CLINICAL DATA:  Do a sacral fracture  EXAM: CT PELVIS WITHOUT CONTRAST  TECHNIQUE: Multidetector CT imaging of the pelvis was  performed following the standard protocol without intravenous contrast.  COMPARISON:  None.  FINDINGS: There is no hip fracture or dislocation. There is orthopedic hardware in the right hip transfixing a healed intertrochanteric fracture. There is a nondisplaced fracture of the right inferior pubic ramus. There is a nondisplaced fracture at the junction of the right superior acetabulum and superior pubic ramus.  There is a fracture of the S2 vertebral body with mild focal kyphosis. The fracture extends into the right sacral ala. There is a small amount of hemorrhagic fluid anterior to the right piriformis muscle.  There is severe degenerative disc disease with broad-based disc bulge at L4-5.  There is a small fat containing umbilical hernia. There is a small amount of pelvic free fluid. There is abdominal aortic atherosclerosis. There is a Foley catheter within the bladder.  IMPRESSION: 1. S2 vertebral body fracture with focal kyphosis centered at S2. The fracture extends into the right sacral ala. 2.  Nondisplaced fracture of the right inferior pubic ramus. 3. Nondisplaced fracture at the junction of the right superior acetabulum and superior pubic ramus.   Electronically Signed   By: Kathreen Devoid   On: 01/01/2014 23:47   Nm Myocar Multi W/spect W/wall Motion / Ef  01/02/2014   CLINICAL DATA:  Chest pain  EXAM: Lexiscan Myovue  TECHNIQUE: The patient received IV Lexiscan .50m over 15 seconds. 33.0 mCi of Technetium 91mestamibi injected at 30 seconds. Quantitative SPECT images were obtained in the vertical, horizontal and short axis planes after a 45 minute delay. Rest images were obtained with similar planes and delay using 10.2 mCi of Technetium 9950mstamibi.  FINDINGS: ECG:  No ischemic changes with Lexiscan infusion.  Symptoms:  No chest pain  Quantitiative Gated SPECT EF: Calcuilated 29% with inferior akinesis. Suspect EF is not that depressed.  Perfusion Images: Large, severe basal to mid inferior  perfusion defect seen at both rest and with Lexiscan stress.  IMPRESSION: 1. Fixed basal to mid inferior defect suggestive of prior myocardial infarction. No significant ischemia.  2. EF calculated at 29%, visually does not appear this low. Inferior akinesis. Would suggest echo correlation.  3.  Intermediate risk stress test.  DalLoralie ChampagneElectronically Signed   By: DalLoralie ChampagneD.   On: 01/02/2014 17:17    ROS ROS: I have reviewed the patient's review of systems thoroughly and there are no positive responses as relates to the HPI. Exam: Blood pressure 113/51, pulse 80, temperature 97.3 F (36.3 C), temperature source Oral, resp. rate 18, height 5' 5.5" (1.664 m), weight 132 lb 8 oz (60.102 kg), SpO2 97.00%. Physical Exam Well-developed well-nourished patient in no acute distress. Alert and oriented x3 HEENT:within normal limits Cardiac: Regular rate and rhythm Pulmonary: Lungs clear to auscultation Abdomen: Soft and nontender.  Normal active bowel sounds  Musculoskeletal: (There is mild tenderness palpation of the left side low back.  There is mild pain with range of motion of the left hip. Assessment/Plan: [age]ar old female with a history of osteoporosis and fracture of the left hip who has recently fallen and now has fractures of the sacrum and superior for pubic ramus.  I had a long discussion with the patient but at this point she can be weightbearing as tolerated in the left side.  It is likely that she will need a walker.  I've encouraged her that even if it is painful to important for her to sit up and stand up to avoid bed related issues and pneumonia.  I think she likely will need skilled nursing facility discharge.  I'll see her back in the office in 2 weeks for repeat evaluation and x-ray.  JohAlta Corning7/2015, 1:43 PM

## 2014-01-03 NOTE — Progress Notes (Signed)
Patient alert and oriented x3-4 throughout shift; intermittently disoriented to time.  Patient's husband at bedside, met with MD to discuss potential discharge to rehab on Monday.  Notified Education officer, museum of patient's husband's request for facility in [place].  Social worker to update patient's husband about availability as discharge draws closer.  Patient's Foley catheter d/c this morning, due to void prior to going to hemodialysis this afternoon.  Will reassess void status upon return to unit.  Will continue to monitor.

## 2014-01-03 NOTE — Progress Notes (Signed)
       Patient Name: Kathleen [name] Date of Encounter: 01/03/2014    SUBJECTIVE: She denies chest discomfort and dyspnea.  TELEMETRY:  Normal sinus rhythm Filed Vitals:   01/02/14 1937 01/02/14 2028 01/03/14 0056 01/03/14 0621  BP:  100/44 103/53 129/51  Pulse:  77 79 84  Temp:  98 F (36.7 C)  97.3 F (36.3 C)  TempSrc:  Oral  Oral  Resp:  18 18 18   Height:      Weight:    132 lb 8 oz (60.102 kg)  SpO2: 97% 98% 100% 97%    Intake/Output Summary (Last 24 hours) at 01/03/14 0838 Last data filed at 01/03/14 0755  Gross per 24 hour  Intake    480 ml  Output    100 ml  Net    380 ml   LABS: Basic Metabolic Panel:  Recent Labs  22/02/54 1247 01/01/14 1333 01/02/14 0055  NA 135* 135* 135*  K 5.0 4.7 3.8  CL 93* 99 91*  CO2 20  --  24  GLUCOSE 206* 202* 140*  BUN 38* 37* 23  CREATININE 4.38* 4.70* 2.79*  CALCIUM 9.2  --  8.5   CBC:  Recent Labs  01/01/14 1247  01/02/14 0055 01/03/14 0427  WBC 14.0*  --  14.4* 11.1*  NEUTROABS 12.0*  --   --   --   HGB 10.4*  < > 10.4* 9.2*  HCT 34.8*  < > 34.3* 29.9*  MCV 88.5  --  88.2 87.2  PLT 387  --  411* 346  < > = values in this interval not displayed. Cardiac Enzymes:  Recent Labs  01/02/14 0630 01/02/14 1902 01/03/14 0016  TROPONINI 1.78* 1.53* 1.64*     Radiology/Studies:   Myocardial perfusion imaging: IMPRESSION:  1. Fixed basal to mid inferior defect suggestive of prior myocardial  infarction. No significant ischemia.  2. EF calculated at 29%, visually does not appear this low. Inferior  akinesis. Would suggest echo correlation.  3. Intermediate risk stress test.  Marca Ancona   Physical Exam: Blood pressure 129/51, pulse 84, temperature 97.3 F (36.3 C), temperature source Oral, resp. rate 18, height 5' 5.5" (1.664 m), weight 132 lb 8 oz (60.102 kg), SpO2 97.00%. Weight change: -10 lb 8 oz (-4.763 kg)  Wt Readings from Last 3 Encounters:  01/03/14 132 lb 8 oz (60.102 kg)  11/16/13 142 lb  10.2 oz (64.7 kg)   Decreased basilar breath sounds S4 gallop with soft apical systolic murmur The patient is lying flat in bed and denies dyspnea No peripheral edema  ASSESSMENT:  1. Coronary artery disease with prior coronary bypass grafting and an intermediate risk myocardial perfusion study but no evidence of ischemia. This is as per dictated and does not suggest that the patient's troponin elevation is due to primary ACS 2. Non-ST elevation myocardial infarction, clinically compatible with type II 3. End stage renal disease 4. Acute on chronic systolic heart failure, resolved after dialysis  Plan:  1. Conservative management with continued to dialysis. Further ischemic evaluation is not indicated based upon the nuclear findings (which provided no surprises, with previously documented systolic dysfunction in March with LVEF 20-25%) 2. Please call if we may be of further assistance.  Signed, Lyn Records III 01/03/2014, 8:38 AM

## 2014-01-03 NOTE — Progress Notes (Addendum)
Subjective:  Patient mental status is much improved. ON UA resulted revealing likely UTI and ceftriaxone was started. Patient had worsening pain throughout the night despite 1 mg q 4 hr morphine. The morphine appears to be lasting ~3 hrs.  Objective: Vital signs in last 24 hours: Filed Vitals:   01/02/14 1937 01/02/14 2028 01/03/14 0056 01/03/14 0621  BP:  100/44 103/53 129/51  Pulse:  77 79 84  Temp:  98 F (36.7 C)  97.3 F (36.3 C)  TempSrc:  Oral  Oral  Resp:  18 18 18   Height:      Weight:    132 lb 8 oz (60.102 kg)  SpO2: 97% 98% 100% 97%   Weight change: -10 lb 8 oz (-4.763 kg)  Intake/Output Summary (Last 24 hours) at 01/03/14 0724 Last data filed at 01/03/14 0559  Gross per 24 hour  Intake    480 ml  Output     50 ml  Net    430 ml   Physical Exam  Constitutional: She is oriented to person, place, and time. She appears well-developed and well-nourished. No distress.  Cardiovascular: Normal rate, regular rhythm and intact distal pulses.   Murmur heard. Pulmonary/Chest: Effort normal and breath sounds normal. No respiratory distress. She has no wheezes. She has no rales.  Abdominal: Soft. Bowel sounds are normal. She exhibits no distension. There is tenderness. There is no rebound and no guarding.  Neurological: She is alert and oriented to person, place, and time.  Skin: She is not diaphoretic.  Psychiatric: She has a normal mood and affect. Her behavior is normal.    Lab Results: Basic Metabolic Panel:  Recent Labs Lab 01/01/14 1247 01/01/14 1333 01/02/14 0055  NA 135* 135* 135*  K 5.0 4.7 3.8  CL 93* 99 91*  CO2 20  --  24  GLUCOSE 206* 202* 140*  BUN 38* 37* 23  CREATININE 4.38* 4.70* 2.79*  CALCIUM 9.2  --  8.5   Liver Function Tests:  Recent Labs Lab 01/02/14 0055  AST 61*  ALT 30  ALKPHOS 146*  BILITOT 1.0  PROT 8.1  ALBUMIN 2.9*   CBC:  Recent Labs Lab 01/01/14 1247  01/02/14 0055 01/03/14 0427  WBC 14.0*  --  14.4* 11.1*   NEUTROABS 12.0*  --   --   --   HGB 10.4*  < > 10.4* 9.2*  HCT 34.8*  < > 34.3* 29.9*  MCV 88.5  --  88.2 87.2  PLT 387  --  411* 346  < > = values in this interval not displayed. Cardiac Enzymes:  Recent Labs Lab 01/02/14 0630 01/02/14 1902 01/03/14 0016  TROPONINI 1.78* 1.53* 1.64*   CBG:  Recent Labs Lab 01/01/14 2136 01/02/14 0615 01/02/14 1108 01/02/14 1630 01/02/14 2132 01/03/14 0550  GLUCAP 132* 113* 88 98 122* 84   Micro Results: Recent Results (from the past 240 hour(s))  MRSA PCR SCREENING     Status: None   Collection Time    01/01/14  9:22 PM      Result Value Ref Range Status   MRSA by PCR NEGATIVE  NEGATIVE Final   Comment:            The GeneXpert MRSA Assay (FDA     approved for NASAL specimens     only), is one component of a     comprehensive MRSA colonization     surveillance program. It is not     intended to diagnose  MRSA     infection nor to guide or     monitor treatment for     MRSA infections.  CULTURE, BLOOD (ROUTINE X 2)     Status: None   Collection Time    01/02/14 11:40 PM      Result Value Ref Range Status   Specimen Description BLOOD RIGHT ANTECUBITAL   Final   Special Requests BOTTLES DRAWN AEROBIC AND ANAEROBIC 10CC   Final   Culture PENDING   Incomplete   Report Status PENDING   Incomplete   Studies/Results: Dg Sacrum/coccyx  01/01/2014   CLINICAL DATA:  Larey Seat, injury, pain  EXAM: SACRUM AND COCCYX - 2+ VIEW  COMPARISON:  None.  FINDINGS: Difficult to exclude a mid sacral fracture, as there is slight angulation with suspected lucency through the S2 segment. Noncontrast CT scan of the pelvis recommended for further evaluation. Severe osteopenia limits evaluation. Furthermore moderate stool burden obscures much of the sacrum on the AP view. Previous ORIF for right hip fracture. Degenerative disc disease lumbar spine. Vascular calcification.  IMPRESSION: Cannot exclude mid S2 fracture. CT pelvis without contrast recommended.    Electronically Signed   By: Davonna Belling M.D.   On: 01/01/2014 14:07   Ct Pelvis Wo Contrast  01/01/2014   CLINICAL DATA:  Do a sacral fracture  EXAM: CT PELVIS WITHOUT CONTRAST  TECHNIQUE: Multidetector CT imaging of the pelvis was performed following the standard protocol without intravenous contrast.  COMPARISON:  None.  FINDINGS: There is no hip fracture or dislocation. There is orthopedic hardware in the right hip transfixing a healed intertrochanteric fracture. There is a nondisplaced fracture of the right inferior pubic ramus. There is a nondisplaced fracture at the junction of the right superior acetabulum and superior pubic ramus.  There is a fracture of the S2 vertebral body with mild focal kyphosis. The fracture extends into the right sacral ala. There is a small amount of hemorrhagic fluid anterior to the right piriformis muscle.  There is severe degenerative disc disease with broad-based disc bulge at L4-5.  There is a small fat containing umbilical hernia. There is a small amount of pelvic free fluid. There is abdominal aortic atherosclerosis. There is a Foley catheter within the bladder.  IMPRESSION: 1. S2 vertebral body fracture with focal kyphosis centered at S2. The fracture extends into the right sacral ala. 2. Nondisplaced fracture of the right inferior pubic ramus. 3. Nondisplaced fracture at the junction of the right superior acetabulum and superior pubic ramus.   Electronically Signed   By: Elige Ko   On: 01/01/2014 23:47   Nm Myocar Multi W/spect W/wall Motion / Ef  01/02/2014   CLINICAL DATA:  Chest pain  EXAM: Lexiscan Myovue  TECHNIQUE: The patient received IV Lexiscan .4mg  over 15 seconds. 33.0 mCi of Technetium 54m Sestamibi injected at 30 seconds. Quantitative SPECT images were obtained in the vertical, horizontal and short axis planes after a 45 minute delay. Rest images were obtained with similar planes and delay using 10.2 mCi of Technetium 76m Sestamibi.  FINDINGS: ECG:  No  ischemic changes with Lexiscan infusion.  Symptoms:  No chest pain  Quantitiative Gated SPECT EF: Calcuilated 29% with inferior akinesis. Suspect EF is not that depressed.  Perfusion Images: Large, severe basal to mid inferior perfusion defect seen at both rest and with Lexiscan stress.  IMPRESSION: 1. Fixed basal to mid inferior defect suggestive of prior myocardial infarction. No significant ischemia.  2. EF calculated at 29%, visually does not  appear this low. Inferior akinesis. Would suggest echo correlation.  3.  Intermediate risk stress test.  Marca Anconaalton Mclean   Electronically Signed   By: Marca Anconaalton  Mclean M.D.   On: 01/02/2014 17:17   Dg Chest Port 1 View  01/01/2014   CLINICAL DATA:  Shortness of breath and dialysis patient. History of CHF.  EXAM: PORTABLE CHEST - 1 VIEW  COMPARISON:  Chest radiograph 11/13/2013 and 09/04/2013  FINDINGS: Stable cardiomegaly in this patient status post median sternotomy and CABG. The superior most sternotomy wire is fractured, unchanged from chest radiograph dated 11/13/2013. There is mild diffuse pulmonary vascular congestion. Negative for discrete pulmonary edema. Surgical suture is seen in the region of the left upper lung, laterally.  There is irregularity and probable callus formation involving 3 left-sided ribs, suggesting subacute or remote rib fractures. Negative for pneumothorax or pleural effusion.  IMPRESSION: Cardiomegaly and pulmonary vascular congestion.  Prior CABG.  Satisfactory position of right IJ dialysis catheter.  Approximately 3 left-sided rib fractures suspected, subacute to remote.   Electronically Signed   By: Britta MccreedySusan  Turner M.D.   On: 01/01/2014 13:27   Medications: I have reviewed the patient's current medications. Scheduled Meds: . ARIPiprazole  5 mg Oral Daily  . aspirin EC  81 mg Oral Daily  . atorvastatin  80 mg Oral q1800  . budesonide-formoterol  2 puff Inhalation BID  . carvedilol  6.25 mg Oral BID WC  . cefTRIAXone (ROCEPHIN)  IV  1 g  Intravenous Q24H  . citalopram  10 mg Oral Daily  . heparin  20 Units/kg Dialysis Once in dialysis  . insulin aspart  0-9 Units Subcutaneous TID WC  . pantoprazole  40 mg Oral Daily  . sodium chloride  3 mL Intravenous Q12H   Continuous Infusions:  PRN Meds:.sodium chloride, sodium chloride, acetaminophen, acetaminophen, bisacodyl, feeding supplement (NEPRO CARB STEADY), heparin, ipratropium-albuterol, lidocaine (PF), lidocaine-prilocaine, morphine injection, pentafluoroprop-tetrafluoroeth Assessment/Plan: Principal Problem:   NSTEMI (non-ST elevated myocardial infarction) Active Problems:   Diabetes   Acute on chronic combined systolic and diastolic heart failure, NYHA class 4   Severe mitral regurgitation   End stage kidney disease   CCF (congestive cardiac failure)   Sacrum and coccyx fracture   Altered mental state   Chronic systolic heart failure  NSTEMI in setting of severe sCHF and severe MR  The patients NSTEMI is likely due to known CAD and volume overload causing hypoxia induced demand ischemia. She has a complicated cardiac history with severe sCHF (EF 20% by echo in march 2015). She had CABG previously. She also has structural heart disease of severe MR and mild to moderate AR. Troponin was elevated at 2.30, remained at 2.3 after HD and is now downtrending. Nuclear stress revealed Fixed basal to mid inferior defect suggestive of prior myocardial infarction. No significant ischemia. EF calculated at 29%, visually does not appear this low. Inferior akinesis. Recent echo had 20-25% EF. - Appreciate card's recs - ASA, BB (coreg 6.25 mg BID) - tele  Sacral and pelvic Fractures The patient has sacral and pevlic fractures. Patient became altered when given 4 mg IV morphine in the ED. Patient is on oxy IR 5 mg q 4 hr prn at home. Patient had poor pain control with IV morphine 1 mg q 4 hr prn overnight. Mental status much improved. Plan to start percocet 1-2 tablets q 6 hr prn with 1  mg morphine IV q 4 hr prn. - Consulted Ortho, appreciate rec's - PT OT eval and  treat after seen by ortho - Senokot and dulcolax  UTI Patients UA concerning for UTI. Patient given ceftriaxone. Will deescalate to oral abx pending culture results.  ESRD  Patient appeared to have fluid overload that contributed to her hypoxic respiratory failure. HD per nephrology. Acute heart failure appears to have resolved with initiation of HD. - vascular surgery evaluated fistula and rec patient to follow up with home vasc sx.  COPD  Patient appears to have stable COPD. However, hypoxia is likely multifactorial and COPD may be contributing.  - duonebs (xopenex) q 6 h prn  - Continue home Symbicort   DM  On lantus 25 U BID  SSI   Diet: Heart carb mod  DVT: Heparin sq  Dispo: Disposition is deferred at this time, awaiting improvement of current medical problems.  Anticipated discharge in approximately 1-2 day(s).   The patient does have a current PCP (No Pcp Per Patient) and does not need an [facility] hospital follow-up appointment after discharge.  The patient does have transportation limitations that hinder transportation to clinic appointments.  .Services Needed at time of discharge: Y = Yes, Blank = No PT:   OT:   RN:   Equipment:   Other:     LOS: 2 days   Pleas Koch, MD 01/03/2014, 7:24 AM

## 2014-01-04 DIAGNOSIS — Z992 Dependence on renal dialysis: Secondary | ICD-10-CM

## 2014-01-04 DIAGNOSIS — N186 End stage renal disease: Secondary | ICD-10-CM

## 2014-01-04 DIAGNOSIS — Z01810 Encounter for preprocedural cardiovascular examination: Secondary | ICD-10-CM

## 2014-01-04 LAB — URINE CULTURE
COLONY COUNT: NO GROWTH
Culture: NO GROWTH

## 2014-01-04 LAB — GLUCOSE, CAPILLARY
GLUCOSE-CAPILLARY: 180 mg/dL — AB (ref 70–99)
GLUCOSE-CAPILLARY: 169 mg/dL — AB (ref 70–99)
GLUCOSE-CAPILLARY: 132 mg/dL — AB (ref 70–99)
Glucose-Capillary: 124 mg/dL — ABNORMAL HIGH (ref 70–99)

## 2014-01-04 LAB — CBC
HCT: 30.8 % — ABNORMAL LOW (ref 36.0–46.0)
Hemoglobin: 9.2 g/dL — ABNORMAL LOW (ref 12.0–15.0)
MCH: 26.4 pg (ref 26.0–34.0)
MCHC: 29.9 g/dL — ABNORMAL LOW (ref 30.0–36.0)
MCV: 88.3 fL (ref 78.0–100.0)
Platelets: 367 10*3/uL (ref 150–400)
RBC: 3.49 MIL/uL — AB (ref 3.87–5.11)
RDW: 18.6 % — AB (ref 11.5–15.5)
WBC: 8 10*3/uL (ref 4.0–10.5)

## 2014-01-04 MED ORDER — BOOST / RESOURCE BREEZE PO LIQD
1.0000 | Freq: Two times a day (BID) | ORAL | Status: DC
  Administered 2014-01-04 – 2014-01-07 (×6): 1 via ORAL

## 2014-01-04 MED ORDER — OXYCODONE HCL 5 MG PO TABS
5.0000 mg | ORAL_TABLET | Freq: Four times a day (QID) | ORAL | Status: DC | PRN
  Administered 2014-01-05 – 2014-01-07 (×3): 5 mg via ORAL
  Filled 2014-01-04 (×4): qty 1

## 2014-01-04 MED ORDER — SENNOSIDES-DOCUSATE SODIUM 8.6-50 MG PO TABS
1.0000 | ORAL_TABLET | Freq: Two times a day (BID) | ORAL | Status: DC
  Administered 2014-01-04 – 2014-01-07 (×7): 1 via ORAL
  Filled 2014-01-04 (×8): qty 1

## 2014-01-04 NOTE — Progress Notes (Signed)
*  PRELIMINARY RESULTS* Vascular Ultrasound Left AVF Duplex has been completed.  Preliminary findings: AVF is patent. Left AC fistula is 2.13mm deep, with the vein diameter of 3.56mm and artery diameter of 3.70mm at this level. Just proximal to anastomosis, the vein measures 3.56mm in diameter and 1.42mm deep. Just distal to anastomosis, the artery measures 4.50mm deep.   Farrel Demark, RDMS, RVT  01/04/2014, 11:46 AM

## 2014-01-04 NOTE — Evaluation (Signed)
Physical Therapy Evaluation Patient Details Name: Kathleen EthJoyce K Gaspari MRN: [id number] DOB: 11-May-1951 Today's Date: 01/04/2014   History of Present Illness  Adm 01/01/14 s/p fall at home (walking with her walker). Pt with multiple Rt pelvic fractures (inferior pubic ramus, at junction of superior pubic ramus and acetabulum, sacral ala) and S2 vertebral body fracture. ?NSTEMI (undergoing cardiac workup). Pt developed AMS with likely UTI. PMHx- DM, ESRD on HD, HTN, COPD, cardiomyopathy, CABG, Rt hip ORIF    Clinical Impression  Patient is s/p fall with multiple pelvic fractures resulting in functional limitations due to the deficits listed below (see PT Problem List).  Patient will benefit from skilled PT to increase their independence and safety with mobility to allow discharge to the venue listed below.       Follow Up Recommendations SNF;Supervision/Assistance - 24 hour    Equipment Recommendations  None recommended by PT    Recommendations for Other Services       Precautions / Restrictions Precautions Precautions: Fall      Mobility  Bed Mobility Overal bed mobility: Needs Assistance Bed Mobility: Supine to Sit     Supine to sit: Mod assist;HOB elevated     General bed mobility comments: Assist for pt to scoot laterally to EOB and then to pull her trunk OOB in order to obtain upright sitting position EOB.  Requires incr  time and step by step cues.  Transfers Overall transfer level: Needs assistance Equipment used: Rolling walker (2 wheeled) Transfers: Sit to/from Stand Sit to Stand: Min assist;+2 safety/equipment (pt very anxious and slightly impulsive) Stand pivot transfers: Min assist;+2 physical assistance;+2 safety/equipment       General transfer comment: Pt pulled up on RW in order to stand (anxious and allowed; anchored RW). Cues to slow down. Pt with very narrow base of support and very small steps to pivot.  Ambulation/Gait             General Gait Details:  pt unable due to pain and fear  Stairs            Wheelchair Mobility    Modified Rankin (Stroke Patients Only)       Balance Overall balance assessment: Needs assistance Sitting-balance support: Bilateral upper extremity supported;Feet supported Sitting balance-Leahy Scale: Poor Sitting balance - Comments: bil UE assist also due to pain   Standing balance support: Bilateral upper extremity supported Standing balance-Leahy Scale: Poor Standing balance comment: using RW                             Pertinent Vitals/Pain Pelvis and sacrum 7/10 initially; reported less once in chair; RN provided medication to assist with pain control patient repositioned for comfort     Home Living Family/patient expects to be discharged to:: Private residence Living Arrangements: Spouse/significant other Available Help at Discharge: Family;Available 24 hours/day Type of Home: House Home Access: Ramped entrance     Home Layout: One level Home Equipment: Walker - 2 wheels;Bedside commode;Shower seat;Wheelchair - manual      Prior Function Level of Independence: Needs assistance   Gait / Transfers Assistance Needed: assist with walker for ambulation since breaking her hip  ADL's / Homemaking Assistance Needed: Husband assists with ADLs as needed.        Hand Dominance        Extremity/Trunk Assessment   Upper Extremity Assessment: Defer to OT evaluation           Lower  Extremity Assessment: RLE deficits/detail;Difficult to assess due to impaired cognition RLE Deficits / Details: AAROM due to pain (and anticipation of pain) WFL; grossly 3+-4/5 (limited by pain)    Cervical / Trunk Assessment: Normal  Communication      Cognition Arousal/Alertness: Awake/alert Behavior During Therapy: Anxious;Impulsive Overall Cognitive Status: No family/caregiver present to determine baseline cognitive functioning Area of Impairment: Following commands;Problem  solving;Safety/judgement;Memory     Memory: Decreased short-term memory Following Commands: Follows multi-step commands inconsistently Safety/Judgement: Decreased awareness of safety   Problem Solving: Slow processing;Decreased initiation;Requires verbal cues;Requires tactile cues General Comments: Pt oriented x4 but gets easily overwhelmed with further questions (such as PLOF). Pt very anxious (and even tearful) throughout session and becomes impulsive with mobility.    General Comments General comments (skin integrity, edema, etc.): Performed LE exercises prior to attempt to sit EOB due to immediate incr anxiety at mention of getting OOB. Pt tolerated exercises very well and then willing to attempt EOB/OOB    Exercises General Exercises - Lower Extremity Ankle Circles/Pumps: AROM;AAROM;Both;10 reps;Supine Quad Sets: AROM;Both;10 reps;Supine Heel Slides: AROM;AAROM;Both;10 reps;Supine Hip ABduction/ADduction: AAROM;Right;5 reps;Supine      Assessment/Plan    PT Assessment Patient needs continued PT services  PT Diagnosis Difficulty walking;Acute pain   PT Problem List Decreased strength;Decreased range of motion;Decreased activity tolerance;Decreased balance;Decreased mobility;Decreased cognition;Decreased knowledge of use of DME;Decreased safety awareness;Cardiopulmonary status limiting activity;Pain  PT Treatment Interventions DME instruction;Gait training;Functional mobility training;Therapeutic exercise;Therapeutic activities;Cognitive remediation;Patient/family education   PT Goals (Current goals can be found in the Care Plan section) Acute Rehab PT Goals Patient Stated Goal: to get more rehab before returning home PT Goal Formulation: With patient Time For Goal Achievement: 01/11/14 Potential to Achieve Goals: Good    Frequency Min 3X/week   Barriers to discharge Decreased caregiver support      Co-evaluation PT/OT/SLP Co-Evaluation/Treatment: Yes Reason for  Co-Treatment: For patient/therapist safety;Necessary to address cognition/behavior during functional activity PT goals addressed during session: Mobility/safety with mobility;Proper use of DME OT goals addressed during session: ADL's and self-care (functional mobility)       End of Session Equipment Utilized During Treatment: Gait belt;Oxygen Activity Tolerance: Other (comment) (limited by anxiety) Patient left: in chair;with call bell/phone within reach;with chair alarm set Nurse Communication: Mobility status         Time: 2449-7530 PT Time Calculation (min): 34 min   Charges:   PT Evaluation $Initial PT Evaluation Tier I: 1 Procedure PT Treatments $Therapeutic Activity: 8-22 mins   PT G CodesScherrie November Anelis Hrivnak Jan 05, 2014, 11:13 AM Pager [phone]

## 2014-01-04 NOTE — Progress Notes (Signed)
[facility] Progress Note   Subjective: No complaints  Filed Vitals:   01/03/14 1909 01/03/14 2152 01/04/14 0516 01/04/14 0920  BP: 114/46 109/52 126/69 117/53  Pulse: 80 76 81 90  Temp:  97.4 F (36.3 C) 98.1 F (36.7 C)   TempSrc:  Oral Oral Oral  Resp: 18 18 18 18   Height:      Weight:   59.3 kg (130 lb 11.7 oz)   SpO2: 95% 99% 99% 95%   Exam: No distress No jvd Chest basilar crackles RRR 2/6 SEM no RG Abd soft, NTND Trace LE edema R IJ Cath, LUA AVF +bruit , not maturing very well Neuro is nf, Ox2  HD: TTS [facility] 4h   64kg   2/2.0 Bath   Heparin 6000    IJ Cath (maturing LUA AVF) EPO 5800         Assessment: 1 Fall / back pain / sacral and pelvic fractures by CT- per primary 2 NSTEMI / hx CABG / Ischemic CM EF 20%- negative myoview for ischemia 3 ESRD on HD- started HD Oct 2014 4 Anemia Hb ok, hold epo 5 HTPH pth normal, no binders or vit D 6 DM on insulin 7 Access- using Diatek, pt will f/u w Dr Sondra Come in [facility] for AVF maturation after D/C 8 Volume- well below dry wt 8 EOL- discussed with pt who is adamant that she does not want life support or heroic measures; unable to reach husband. May need to confirm this w her husband before changing the order.     Plan- HD tomorrow    Vinson Moselle MD  pager [phone]    cell [phone]  01/04/2014, 11:44 AM     Recent Labs Lab 01/01/14 1247 01/01/14 1333 01/02/14 0055 01/03/14 1457  NA 135* 135* 135* 136*  K 5.0 4.7 3.8 3.9  CL 93* 99 91* 95*  CO2 20  --  24 26  GLUCOSE 206* 202* 140* 118*  BUN 38* 37* 23 42*  CREATININE 4.38* 4.70* 2.79* 3.39*  CALCIUM 9.2  --  8.5 8.5  PHOS  --   --   --  5.0*    Recent Labs Lab 01/02/14 0055 01/03/14 1457  AST 61*  --   ALT 30  --   ALKPHOS 146*  --   BILITOT 1.0  --   PROT 8.1  --   ALBUMIN 2.9* 2.3*    Recent Labs Lab 01/01/14 1247  01/03/14 0427 01/03/14 1931 01/04/14 0500  WBC 14.0*  < > 11.1* 11.4* 8.0  NEUTROABS 12.0*  --    --  9.1*  --   HGB 10.4*  < > 9.2* 9.3* 9.2*  HCT 34.8*  < > 29.9* 30.1* 30.8*  MCV 88.5  < > 87.2 86.7 88.3  PLT 387  < > 346 398 367  < > = values in this interval not displayed. . ARIPiprazole  5 mg Oral QHS  . aspirin EC  81 mg Oral Daily  . atorvastatin  80 mg Oral q1800  . budesonide-formoterol  2 puff Inhalation BID  . carvedilol  6.25 mg Oral BID WC  . cefTRIAXone (ROCEPHIN)  IV  1 g Intravenous Q24H  . citalopram  10 mg Oral Daily  . feeding supplement (RESOURCE BREEZE)  1 Container Oral BID PC  . heparin subcutaneous  5,000 Units Subcutaneous 3 times per day  . heparin  6,000 Units Dialysis Once in dialysis  . insulin aspart  0-9 Units Subcutaneous  TID WC  . pantoprazole  40 mg Oral Daily  . senna-docusate  1 tablet Oral BID  . sodium chloride  3 mL Intravenous Q12H     sodium chloride, sodium chloride, sodium chloride, sodium chloride, acetaminophen, acetaminophen, bisacodyl, feeding supplement (NEPRO CARB STEADY), heparin, ipratropium-albuterol, lidocaine (PF), lidocaine-prilocaine, oxyCODONE-acetaminophen, pentafluoroprop-tetrafluoroeth

## 2014-01-04 NOTE — Progress Notes (Signed)
Subjective:  Patient is doing much better today. Her pain is better controlled and she is in better spirits. She requests to be made DNR. I spoke at length with her and her husband about this decision. This is a long held belief.  Objective: Vital signs in last 24 hours: Filed Vitals:   01/03/14 2152 01/04/14 0516 01/04/14 0920 01/04/14 1144  BP: 109/52 126/69 117/53   Pulse: 76 81 90   Temp: 97.4 F (36.3 C) 98.1 F (36.7 C)    TempSrc: Oral Oral Oral   Resp: Height:      Weight:  130 lb 11.7 oz (59.3 kg)    SpO2: 99% 99% 95% 96%   Weight change: 10.5 oz (0.299 kg)  Intake/Output Summary (Last 24 hours) at 01/04/14 1340 Last data filed at 01/04/14 0900  Gross per 24 hour  Intake    480 ml  Output    242 ml  Net    238 ml   Physical Exam  Constitutional: She is oriented to person, place, and time. She appears well-developed and well-nourished. No distress.  Cardiovascular: Normal rate, regular rhythm and intact distal pulses.   Murmur heard. Pulmonary/Chest: Effort normal and breath sounds normal. No respiratory distress. She has no wheezes. She has no rales.  Abdominal: Soft. Bowel sounds are normal. She exhibits no distension. There is tenderness. There is no rebound and no guarding.  Neurological: She is alert and oriented to person, place, and time.  Skin: She is not diaphoretic.  Psychiatric: She has a normal mood and affect. Her behavior is normal.    Lab Results: Basic Metabolic Panel:  Recent Labs Lab 01/02/14 0055 01/03/14 1457  NA 135* 136*  K 3.8 3.9  CL 91* 95*  CO2 24 26  GLUCOSE 140* 118*  BUN 23 42*  CREATININE 2.79* 3.39*  CALCIUM 8.5 8.5  PHOS  --  5.0*   Liver Function Tests:  Recent Labs Lab 01/02/14 0055 01/03/14 1457  AST 61*  --   ALT 30  --   ALKPHOS 146*  --   BILITOT 1.0  --   PROT 8.1  --   ALBUMIN 2.9* 2.3*   CBC:  Recent Labs Lab 01/01/14 1247  01/03/14 1931 01/04/14 0500  WBC 14.0*  < > 11.4* 8.0   NEUTROABS 12.0*  --  9.1*  --   HGB 10.4*  < > 9.3* 9.2*  HCT 34.8*  < > 30.1* 30.8*  MCV 88.5  < > 86.7 88.3  PLT 387  < > 398 367  < > = values in this interval not displayed. Cardiac Enzymes:  Recent Labs Lab 01/02/14 1902 01/03/14 0016 01/03/14 1100  TROPONINI 1.53* 1.64* 1.00*   CBG:  Recent Labs Lab 01/03/14 0550 01/03/14 1050 01/03/14 1907 01/03/14 2057 01/04/14 0602 01/04/14 1129  GLUCAP 84 142* 84 109* 124* 180*   Micro Results: Recent Results (from the past 240 hour(s))  MRSA PCR SCREENING     Status: None   Collection Time    01/01/14  9:22 PM      Result Value Ref Range Status   MRSA by PCR NEGATIVE  NEGATIVE Final   Comment:            The GeneXpert MRSA Assay (FDA     approved for NASAL specimens     only), is one component of a     comprehensive MRSA colonization     surveillance program. It is  not     intended to diagnose MRSA     infection nor to guide or     monitor treatment for     MRSA infections.  URINE CULTURE     Status: None   Collection Time    01/02/14  8:15 PM      Result Value Ref Range Status   Specimen Description URINE, RANDOM   Final   Special Requests NONE   Final   Culture  Setup Time     Final   Value: 01/03/2014 08:00     Performed at [facility]   Colony Count     Final   Value: NO GROWTH     [facility]formed at [facility]   Culture     Final   Value: NO GROWTH     Performed at [facility]   Report Status 01/04/2014 FINAL   Final  CULTURE, BLOOD (ROUTINE X 2)     Status: None   Collection Time    01/02/14 11:40 PM      Result Value Ref Range Status   Specimen Description BLOOD RIGHT ANTECUBITAL   Final   Special Requests BOTTLES DRAWN AEROBIC AND ANAEROBIC 10CC   Final   Culture  Setup Time     Final   Value: 01/03/2014 03:07     Performed at [facility]   Culture     Final   Value:        BLOOD CULTURE RECEIVED NO GROWTH TO DATE CULTURE WILL BE HELD FOR 5 DAYS BEFORE ISSUING A  FINAL NEGATIVE REPORT     Performed at [facility]   Report Status PENDING   Incomplete  CULTURE, BLOOD (ROUTINE X 2)     Status: None   Collection Time    01/02/14 11:48 PM      Result Value Ref Range Status   Specimen Description BLOOD RIGHT HAND   Final   Special Requests BOTTLES DRAWN AEROBIC ONLY 10CC   Final   Culture  Setup Time     Final   Value: 01/03/2014 03:07     Performed at [facility]   Culture     Final   Value:        BLOOD CULTURE RECEIVED NO GROWTH TO DATE CULTURE WILL BE HELD FOR 5 DAYS BEFORE ISSUING A FINAL NEGATIVE REPORT     Performed at [facility]   Report Status PENDING   Incomplete   Studies/Results: Nm Myocar Multi W/spect W/wall Motion / Ef  01/02/2014   CLINICAL DATA:  Chest pain  EXAM: Lexiscan Myovue  TECHNIQUE: The patient received IV Lexiscan .4mg  over 15 seconds. 33.0 mCi of Technetium 8520m Sestamibi injected at 30 seconds. Quantitative SPECT images were obtained in the vertical, horizontal and short axis planes after a 45 minute delay. Rest images were obtained with similar planes and delay using 10.2 mCi of Technetium 5720m Sestamibi.  FINDINGS: ECG:  No ischemic changes with Lexiscan infusion.  Symptoms:  No chest pain  Quantitiative Gated SPECT EF: Calcuilated 29% with inferior akinesis. Suspect EF is not that depressed.  Perfusion Images: Large, severe basal to mid inferior perfusion defect seen at both rest and with Lexiscan stress.  IMPRESSION: 1. Fixed basal to mid inferior defect suggestive of prior myocardial infarction. No significant ischemia.  2. EF calculated at 29%, visually does not appear this low. Inferior akinesis. Would suggest echo correlation.  3.  Intermediate risk stress test.  Dalton Mclean   Electronically Signed   By: Marca Ancona M.D.   On: 01/02/2014 17:17   Medications: I have reviewed the patient's current medications. Scheduled Meds: . ARIPiprazole  5 mg Oral QHS  . aspirin EC  81 mg Oral Daily  .  atorvastatin  80 mg Oral q1800  . budesonide-formoterol  2 puff Inhalation BID  . carvedilol  6.25 mg Oral BID WC  . citalopram  10 mg Oral Daily  . feeding supplement (RESOURCE BREEZE)  1 Container Oral BID PC  . heparin subcutaneous  5,000 Units Subcutaneous 3 times per day  . heparin  6,000 Units Dialysis Once in dialysis  . insulin aspart  0-9 Units Subcutaneous TID WC  . pantoprazole  40 mg Oral Daily  . senna-docusate  1 tablet Oral BID  . sodium chloride  3 mL Intravenous Q12H   Continuous Infusions:  PRN Meds:.sodium chloride, sodium chloride, sodium chloride, sodium chloride, acetaminophen, acetaminophen, bisacodyl, feeding supplement (NEPRO CARB STEADY), heparin, ipratropium-albuterol, lidocaine (PF), lidocaine-prilocaine, oxyCODONE-acetaminophen, pentafluoroprop-tetrafluoroeth Assessment/Plan: Principal Problem:   NSTEMI (non-ST elevated myocardial infarction) Active Problems:   Diabetes   Acute on chronic combined systolic and diastolic heart failure, NYHA class 4   Severe mitral regurgitation   End stage kidney disease   CCF (congestive cardiac failure)   Sacrum and coccyx fracture   Altered mental state   Chronic systolic heart failure  NSTEMI in setting of severe sCHF and severe MR  The patients NSTEMI is likely due to known CAD and volume overload causing hypoxia induced demand ischemia. She has a complicated cardiac history with severe sCHF (EF 20% by echo in march 2015). She had CABG previously. She also has structural heart disease of severe MR and mild to moderate AR. Troponin was elevated at 2.30, remained at 2.3 after HD and downtrended. Nuclear stress revealed Fixed basal to mid inferior defect suggestive of prior myocardial infarction. No significant ischemia. EF calculated at 29%, visually does not appear this low. Inferior akinesis. Recent echo had 20-25% EF. - Card rec medical management as nuclear stress was similar to previous and has signed off - ASA, BB  (coreg 6.25 mg BID) - tele  Sacral and pelvic Fractures The patient has sacral and pelvic fractures. Patient became altered when given 4 mg IV morphine in the ED. Patient is on oxy IR 5 mg q 4 hr prn at home. Mental status much improved from admission. Pain much better controlled. - Plan to continue percocet 1-2 tablets q 6 hr prn  - D/C 1 mg morphine IV q 4 hr prn. - Consulted Ortho, rec WBAT and PT - PT rec SNF - Senokot and dulcolax scheduled.  UTI Patients UA concerning for UTI. Patient given ceftriaxone. Urine Culture negative. Discontinued abx.  ESRD  Patient appeared to have fluid overload that contributed to her hypoxic respiratory failure. HD per nephrology. Acute heart failure appears to have resolved with initiation of HD. - vascular surgery evaluated fistula and rec patient to follow up with home vasc sx. - duplex performed while patient inpatient.  COPD  Patient appears to have stable COPD. However, hypoxia is likely multifactorial and COPD may be contributing.  - duonebs (xopenex) q 6 h prn  - Continue home Symbicort   DM  On lantus 25 U BID  SSI   Diet: Heart carb mod  DVT: Heparin sq  Dispo: Anticipate discharge to SNF after HD on Monday.  The patient does have a current PCP (No Pcp Per  Patient) and does not need an [facility] hospital follow-up appointment after discharge.  The patient does have transportation limitations that hinder transportation to clinic appointments.  .Services Needed at time of discharge: Y = Yes, Blank = No PT:   OT:   RN:   Equipment:   Other:     LOS: 3 days   Pleas Koch, MD 01/04/2014, 1:40 PM

## 2014-01-04 NOTE — Progress Notes (Addendum)
INITIAL NUTRITION ASSESSMENT  DOCUMENTATION CODES Per approved criteria  -Not Applicable   INTERVENTION: Provide Resource Breeze BID Provide Snacks BID Encourage PO intake  NUTRITION DIAGNOSIS: Increased nutrient needs related to hemodialysis as evidenced by estimated energy needs and weight loss despite adequate intake.   Goal: Pt to meet >/= 90% of their estimated nutrition needs   Monitor:  PO intake, weight trends, labs  Reason for Assessment: COnsult/ Malnutrition Screening Tool  63 y.o. female  Admitting Dx: NSTEMI (non-ST elevated myocardial infarction)  ASSESSMENT: 63 y/o female with PPMH of CAD s/p CABG, ESRD on HD, chronic systolic HF, DM presents with back pain s/p fall. She was taken to another hospital and had x rays done and was sent home on pain medication. Patient took the pain meds and was confused (history from chart) over night. Her husband had reported that she had increase work of breathing overnight and he was unable to get her out of bed to go for HD.  Weight history shows that pt has lost >8% of her body weight in less than 2 months.  Pt getting ready to be transported to vascular lab during visit. Pt states she had a good appetite and was eating well PTA with 3 meals daily. She states her appetite has been poor for less than one week. She is unsure of her usual body weight, states that if she were to guess her current weight she would guess 160 lbs. Pt strongly dislikes Nepro Shakes. Encouraged PO intake with extra snacks and protein-rich foods. Pt agreeable to drinking Resource Breeze supplements.  Per nursing notes pt ate 95% of her breakfast this morning.   Height: Ht Readings from Last 1 Encounters:  01/01/14 5' 5.5" (1.664 m)    Weight: Wt Readings from Last 1 Encounters:  01/04/14 130 lb 11.7 oz (59.3 kg)    Ideal Body Weight: 127 lbs  % Ideal Body Weight: 102%  Wt Readings from Last 10 Encounters:  01/04/14 130 lb 11.7 oz (59.3 kg)   11/16/13 142 lb 10.2 oz (64.7 kg)    Usual Body Weight: 142 lbs  % Usual Body Weight: 92%  BMI:  Body mass index is 21.42 kg/(m^2).  Estimated Nutritional Needs: Kcal: 1800-2000 Protein: 70-80 grams Fluid: 1.8 L/day  Skin: non-pitting RLE and LLE edema  Diet Order:    EDUCATION NEEDS: -No education needs identified at this time   Intake/Output Summary (Last 24 hours) at 01/04/14 1008 Last data filed at 01/04/14 0900  Gross per 24 hour  Intake    480 ml  Output    242 ml  Net    238 ml    Last BM: 5/5  Labs:   Recent Labs Lab 01/01/14 1247 01/01/14 1333 01/02/14 0055 01/03/14 1457  NA 135* 135* 135* 136*  K 5.0 4.7 3.8 3.9  CL 93* 99 91* 95*  CO2 20  --  24 26  BUN 38* 37* 23 42*  CREATININE 4.38* 4.70* 2.79* 3.39*  CALCIUM 9.2  --  8.5 8.5  PHOS  --   --   --  5.0*  GLUCOSE 206* 202* 140* 118*    CBG (last 3)   Recent Labs  01/03/14 1907 01/03/14 2057 01/04/14 0602  GLUCAP 84 109* 124*    Scheduled Meds: . ARIPiprazole  5 mg Oral QHS  . aspirin EC  81 mg Oral Daily  . atorvastatin  80 mg Oral q1800  . budesonide-formoterol  2 puff Inhalation BID  . carvedilol  6.25 mg Oral BID WC  . cefTRIAXone (ROCEPHIN)  IV  1 g Intravenous Q24H  . citalopram  10 mg Oral Daily  . heparin subcutaneous  5,000 Units Subcutaneous 3 times per day  . heparin  6,000 Units Dialysis Once in dialysis  . insulin aspart  0-9 Units Subcutaneous TID WC  . pantoprazole  40 mg Oral Daily  . senna-docusate  1 tablet Oral BID  . sodium chloride  3 mL Intravenous Q12H    Continuous Infusions:   Past Medical History  Diagnosis Date  . Chronic kidney disease   . Diabetes mellitus without complication   . Hypertension   . COPD (chronic obstructive pulmonary disease)   . Ischemic cardiomyopathy   . Anemia     Past Surgical History  Procedure Laterality Date  . Knee cartilage surgery Left 2007  . Video assisted thoracoscopy (vats)/ lymph node sampling Left 2007   . Incontinence surgery  ?2006  . Coronary artery bypass graft  2010    x 1    Ian Malkin RD, LDN Inpatient Clinical Dietitian Pager: [phone] After Hours Pager: [phone]

## 2014-01-04 NOTE — Progress Notes (Signed)
Patient seen and examined. Complained of persistent back pain overnight. No CP, no sob, no abd pain. Dr. Glendell Docker and I discussed the case in detail with patient and her husband at bedside. Patient expressed wish to be DNR. We explained what that entailed and she reaffirmed that that was her wish   Cardiovascular- heart sounds normal, regular rate and rhythm, murmur +  Lungs- resolved bibasilar crackles  Extremities- no lower extremity edema  Neuro- Alert, oriented,  Abdomen- soft, non tender, non distended, bowel sounds +   I have reviewed Dr. Louann Liv note and agree with documentation as outlined  Acute on chronic systolic HF, NSTEMI  - No further evaluation at this time. Stress test result noted - c/w beta blocker, asa, statin  - HF resolved with dialysis  ESRD on HD  - Nephro following. Continue with HD per nephro  - Vascular follow up noted. No intervention for AVF at this time. Outpatient follow up with Dr. Sondra Come  - AVF patent on doppler  Acute sacral fracture, non displaced fracture of acetabulum and superior pubic ramus  - c/w pain control  - PT/OT eval noted.Patient will need to go to rehab on discharge - Patient ok for weight bearing per ortho.   Altered mental status  - likely delirium secondary to pain meds and possibly from the pain itself  - currently resolved. Will monitor   Possible UTI  -  Urine culture with no growth. Blood cultures negative till date  -  Ceftriaxone dc'd   Anemia  - Hemoglobin stable now.  - Anemia likely secondary to ESRD - May require aranesp as outpatient  Dispo - Stable for d/c to SNF once bed available- likely Monday

## 2014-01-04 NOTE — Progress Notes (Addendum)
VASCULAR LAB PRELIMINARY  PRELIMINARY  PRELIMINARY  PRELIMINARY  Right  Upper Extremity Vein Map    Cephalic  Segment Diameter Depth Comment  1. Axilla 3.32mm mm   2. Mid upper arm 2.81mm mm   3. Above AC 2.50mm mm   4. In AC 2.18mm mm   5. Below AC 2.64mm mm branch  6. Mid forearm 2.73mm mm thrombus  7. Wrist 1.26mm mm    mm mm    mm mm    mm mm    Basilic  Segment Diameter Depth Comment  1. Axilla 3.37mm 17.19mm   2. Mid upper arm 2.45mm 9.35mm   3. Above AC 3.44mm mm   4. In [facility] 1.52mm mm Multiple branches  5. Below AC mm mm   6. Mid forearm mm mm   7. Wrist mm mm    mm mm    mm mm    mm mm     Left Upper Extremity Vein Map    Cephalic  Segment Diameter Depth Comment  1. Axilla 4.76mm mm   2. Mid upper arm 7.33mm mm   3. Above AC 5.58mm mm   4. In AC mm mm   5. Below AC mm mm   6. Mid forearm 3.34mm mm   7. Wrist 2.85mm mm    mm mm    mm mm    mm mm    Basilic  Segment Diameter Depth Comment  1. Axilla 5.54mm 25.35mm   2. Mid upper arm 4.64mm 12.35mm branch  3. Above AC 3.50mm 11.48mm branch  4. In [facility] 2.37mm 11.38mm branch  5. Below AC 2.4mm mm Multiple branches  6. Mid forearm mm mm   7. Wrist mm mm    mm mm    mm mm    mm mm     Glendale Chard, RVT 01/04/2014, 11:42 AM

## 2014-01-04 NOTE — Evaluation (Signed)
Occupational Therapy Evaluation Patient Details Name: Kathleen [name] MRN: [id number] DOB: 12-Oct-1950 Today's Date: 01/04/2014    History of Present Illness Adm 01/01/14 s/p fall at home (walking with her walker). Pt with multiple Rt pelvic fractures (inferior pubic ramus, at junction of superior pubic ramus and acetabulum, sacral ala) and S2 vertebral body fracture. ?NSTEMI (undergoing cardiac workup). Pt developed AMS with likely UTI. PMHx- DM, ESRD on HD, HTN, COPD, cardiomyopathy, CABG, Rt hip ORIF    Clinical Impression   Pt admitted with above. Pt will benefit from acute OT services to address below problem list.  Pt very anxious throughout session (fearful to perform OOB mobility) and impulsive with transfer from bed into chair.  Recommending SNF for d/c planning to further maximize safety and independence with functional mobility and ADLs prior to eventual return home.     Follow Up Recommendations  SNF;Supervision/Assistance - 24 hour    Equipment Recommendations  None recommended by OT    Recommendations for Other Services       Precautions / Restrictions Precautions Precautions: Fall      Mobility Bed Mobility Overal bed mobility: Needs Assistance Bed Mobility: Supine to Sit     Supine to sit: Mod assist     General bed mobility comments: Assist for pt to pull her trunk OOB in order to obtain upright sitting position EOB.  Requires incr  time and step by step cues.  Transfers Overall transfer level: Needs assistance Equipment used: Rolling walker (2 wheeled) Transfers: Sit to/from [employer] CorporationStand;Stand Pivot Transfers Sit to Stand: Min assist;+2 physical assistance Stand pivot transfers: Min assist;+2 physical assistance       General transfer comment: Pt attempting to pull up on RW in order to stand. Cues to slow down.     Balance                                            ADL Overall ADL's : Needs assistance/impaired Eating/Feeding:  Independent;Bed level;Sitting                       Toilet Transfer: +2 for physical assistance;Minimal assistance;Stand-pivot Toilet Transfer Details (indicate cue type and reason): +2 assist for safety due to pt impulsiveness.         Functional mobility during ADLs: +2 for physical assistance;Minimal assistance;Rolling walker General ADL Comments: Pt c/o pain throughout session. Once sitting EOB, pt began yelling "please just let me lay down. I have to lay down."  C/o sharp pain in her back.   Pt then stated "Well then just let me get in the chair."  Pt unsafe with quick movements and initiallly refusing to use RW to get into chair.  +2 assist helpful for safety.     Vision                     Perception     Praxis      Pertinent Vitals/Pain 89% SpO2 on RA at rest.  92% SpO2 on 2L O2 nasal cannula. RN aware.     Hand Dominance     Extremity/Trunk Assessment Upper Extremity Assessment Upper Extremity Assessment: Overall WFL for tasks assessed           Communication     Cognition Arousal/Alertness: Awake/alert Behavior During Therapy: Anxious Overall Cognitive Status: Impaired/Different from baseline Area of Impairment: Following  commands;Problem solving;Safety/judgement       Following Commands: Follows multi-step commands inconsistently Safety/Judgement: Decreased awareness of safety   Problem Solving: Slow processing;Decreased initiation General Comments: Pt oriented x4 but gets easily overwhelmed with further questions (such as PLOF). Pt very anxious throughout session and becomes impulsive with mobility.   General Comments       Exercises       Shoulder Instructions      Home Living Family/patient expects to be discharged to:: Private residence Living Arrangements: Spouse/significant other Available Help at Discharge: Family;Available 24 hours/day Type of Home: House Home Access: Ramped entrance     Home Layout: One level      Bathroom Shower/Tub: Chief Strategy Officer: Standard     Home Equipment: Environmental consultant - 2 wheels;Bedside commode;Shower seat;Wheelchair - manual          Prior Functioning/Environment Level of Independence: Needs assistance    ADL's / Homemaking Assistance Needed: Husband assists with ADLs as needed.        OT Diagnosis: Generalized weakness;Acute pain;Cognitive deficits   OT Problem List: Decreased strength;Decreased activity tolerance;Decreased cognition;Decreased safety awareness;Decreased knowledge of use of DME or AE;Pain;Impaired balance (sitting and/or standing)   OT Treatment/Interventions: Self-care/ADL training;DME and/or AE instruction;Therapeutic activities;Patient/family education;Balance training;Cognitive remediation/compensation    OT Goals(Current goals can be found in the care plan section) Acute Rehab OT Goals Patient Stated Goal: to get more rehab before returning home OT Goal Formulation: With patient Time For Goal Achievement: 01/18/14 Potential to Achieve Goals: Good  OT Frequency: Min 2X/week   Barriers to D/C:            Co-evaluation PT/OT/SLP Co-Evaluation/Treatment: Yes Reason for Co-Treatment: For patient/therapist safety   OT goals addressed during session: ADL's and self-care (functional mobility)      End of Session Equipment Utilized During Treatment: Rolling walker;Gait belt;Oxygen Nurse Communication: Mobility status;Patient requests pain meds  Activity Tolerance: Patient limited by pain Patient left: in chair;with call bell/phone within reach;with chair alarm set   Time: [phone] OT Time Calculation (min): 32 min Charges:  OT General Charges $OT Visit: 1 Procedure OT Evaluation $Initial OT Evaluation Tier I: 1 Procedure OT Treatments $Self Care/Home Management : 8-22 mins G-Codes:    Grant Ruts 2014-01-13, 9:47 AM  01/13/14 Grant Ruts OTR/L Pager [phone] Office [phone]

## 2014-01-05 LAB — CBC
HCT: 31.8 % — ABNORMAL LOW (ref 36.0–46.0)
Hemoglobin: 9.2 g/dL — ABNORMAL LOW (ref 12.0–15.0)
MCH: 26 pg (ref 26.0–34.0)
MCHC: 28.9 g/dL — AB (ref 30.0–36.0)
MCV: 89.8 fL (ref 78.0–100.0)
Platelets: 359 10*3/uL (ref 150–400)
RBC: 3.54 MIL/uL — ABNORMAL LOW (ref 3.87–5.11)
RDW: 18.4 % — AB (ref 11.5–15.5)
WBC: 7.9 10*3/uL (ref 4.0–10.5)

## 2014-01-05 LAB — GLUCOSE, CAPILLARY
GLUCOSE-CAPILLARY: 135 mg/dL — AB (ref 70–99)
Glucose-Capillary: 152 mg/dL — ABNORMAL HIGH (ref 70–99)
Glucose-Capillary: 148 mg/dL — ABNORMAL HIGH (ref 70–99)
Glucose-Capillary: 197 mg/dL — ABNORMAL HIGH (ref 70–99)

## 2014-01-05 MED ORDER — HEPARIN SODIUM (PORCINE) 1000 UNIT/ML DIALYSIS: 2000.0000 [IU] | INTRAMUSCULAR | Status: DC | PRN

## 2014-01-05 MED ORDER — PENTAFLUOROPROP-TETRAFLUOROETH EX AERO: 1.0000 "application " | INHALATION_SPRAY | CUTANEOUS | Status: DC | PRN

## 2014-01-05 MED ORDER — LIDOCAINE-PRILOCAINE 2.5-2.5 % EX CREA: 1.0000 "application " | TOPICAL_CREAM | CUTANEOUS | Status: DC | PRN

## 2014-01-05 MED ORDER — SODIUM CHLORIDE 0.9 % IV SOLN: 100.0000 mL | INTRAVENOUS | Status: DC | PRN

## 2014-01-05 MED ORDER — NEPRO/CARBSTEADY PO LIQD: 237.0000 mL | ORAL | Status: DC | PRN

## 2014-01-05 MED ORDER — DARBEPOETIN ALFA-POLYSORBATE 60 MCG/0.3ML IJ SOLN
60.0000 ug | INTRAMUSCULAR | Status: DC
  Administered 2014-01-05: 60 ug via INTRAVENOUS
  Filled 2014-01-05: qty 0.3

## 2014-01-05 MED ORDER — ALTEPLASE 2 MG IJ SOLR: 2.0000 mg | Freq: Once | INTRAMUSCULAR | Status: DC | PRN

## 2014-01-05 MED ORDER — OXYCODONE-ACETAMINOPHEN 5-325 MG PO TABS
ORAL_TABLET | ORAL | Status: AC
  Filled 2014-01-05: qty 2

## 2014-01-05 MED ORDER — LIDOCAINE HCL (PF) 1 % IJ SOLN: 5.0000 mL | INTRAMUSCULAR | Status: DC | PRN

## 2014-01-05 MED ORDER — DARBEPOETIN ALFA-POLYSORBATE 60 MCG/0.3ML IJ SOLN
INTRAMUSCULAR | Status: AC
  Administered 2014-01-05: 60 ug via INTRAVENOUS
  Filled 2014-01-05: qty 0.3

## 2014-01-05 MED ORDER — HEPARIN SODIUM (PORCINE) 1000 UNIT/ML DIALYSIS: 1000.0000 [IU] | INTRAMUSCULAR | Status: DC | PRN

## 2014-01-05 MED ORDER — BISACODYL 5 MG PO TBEC
5.0000 mg | DELAYED_RELEASE_TABLET | Freq: Every day | ORAL | Status: DC
  Administered 2014-01-05 – 2014-01-07 (×3): 5 mg via ORAL
  Filled 2014-01-05 (×3): qty 1

## 2014-01-05 NOTE — Progress Notes (Signed)
Subjective:  Seen on dialysis, currently comfortable, no dyspnea  Objective: Vital signs in last 24 hours: Temp:  [97.5 F (36.4 C)-97.9 F (36.6 C)] 97.5 F (36.4 C) (05/09 0655) Pulse Rate:  [83-93] 89 (05/09 0730) Resp:  [10-18] 10 (05/09 0655) BP: (103-143)/(49-74) 129/69 mmHg (05/09 0730) SpO2:  [95 %-100 %] 100 % (05/09 0655) Weight:  [60.5 kg (133 lb 6.1 oz)-60.7 kg (133 lb 13.1 oz)] 60.5 kg (133 lb 6.1 oz) (05/09 0655) Weight change: 0.3 kg (10.6 oz)  Intake/Output from previous day: 05/08 0701 - 05/09 0700 In: 1200 [P.O.:1200] Out: 0    Lab Results:  Recent Labs  01/04/14 0500 01/05/14 0545  WBC 8.0 7.9  HGB 9.2* 9.2*  HCT 30.8* 31.8*  PLT 367 359   BMET:  Recent Labs  01/03/14 1457  NA 136*  K 3.9  CL 95*  CO2 26  GLUCOSE 118*  BUN 42*  CREATININE 3.39*  CALCIUM 8.5  ALBUMIN 2.3*   No results found for this basename: PTH,  in the last 72 hours Iron Studies: No results found for this basename: IRON, TIBC, TRANSFERRIN, FERRITIN,  in the last 72 hours  Studies/Results: No results found.  EXAM: General appearance:  Alert, in no apparent distress Resp:  CTA without rales, rhonchi, or wheezes Cardio:  RRR with Gr II/VI systolic murmur, no rub GI:  + BS, soft and nontender Extremities:  No edema Access:  R IJ catheter with BFR 400, AVF @ LUA with weak bruit  HD: T[facility]  4h 64kg 2/2.0 [facility] Heparin 6000 IJ Cath (maturing LUA AVF)  EPO 5800   Assessment/Plan: 1. Pelvic & sacral fxs - sec to fall; CT with sacral fx, nondisplaced fx of acetabulum & superior pubic ramus; per primary. 2. NSTEMI - Hx CABG, ICM with EF 20%, myoview negative for ischemia. 3. ESRD - HD on TTS @ [facility], K 3.9.  HD today. 4. HTN/Volume - BP 122/76 on Carvedilol; wt 60.5 kg, below EDW. 5. Anemia - Hgb 9.2. Aranesp 60 mcg today.  6. Sec HPT - Ca 8.5 (9.9 corrected), P 5; no Hectorol or binders. 7. Nutrition - Alb 2.3, renal diet, vitamin. 8. Access - using IJ catheter, AVF  maturing poorly, follow-up with Dr. Sondra [facility] in [facility] after dc. 9. DM - on insulin 10. EOL- pt w prgoressive physical and cognitive decline, she has requested DNR status.    LOS: 4 days   Gerome Apley 01/05/2014,8:21 AM  Pt seen, examined and agree w A/P as above. On HD , stable.  She says she has side effects from "cholesterol medication" , aching and pain in her legs and wants them stopped. I have stopped statins and she requests that they not be restarted.  Vinson Moselle MD pager [phone]    cell [phone] 01/05/2014, 9:25 AM

## 2014-01-05 NOTE — Procedures (Signed)
I was present at this dialysis session, have reviewed the session itself and made  appropriate changes  Vinson Moselle MD (pgr) [phone]    ([phone] 01/05/2014, 9:15 AM

## 2014-01-05 NOTE — Progress Notes (Signed)
Subjective:  NAEON. Resting well in HD. Continues to have sacral/pelivc pain.  Patient right calf tenderness.  Objective: Vital signs in last 24 hours: Filed Vitals:   01/04/14 1144 01/04/14 1353 01/04/14 2200 01/05/14 0609  BP:  103/49 109/53 135/65  Pulse:  88 83 89  Temp:  97.9 F (36.6 C) 97.9 F (36.6 C) 97.6 F (36.4 C)  TempSrc:  Oral Oral Oral  Resp:  18 18 18   Height:      Weight:    133 lb 13.1 oz (60.7 kg)  SpO2: 96% 97% 98% 100%   Weight change: 10.6 oz (0.3 kg)  Intake/Output Summary (Last 24 hours) at 01/05/14 0719 Last data filed at 01/05/14 0631  Gross per 24 hour  Intake   1200 ml  Output      0 ml  Net   1200 ml   Physical Exam  Constitutional: She is oriented to person, place, and time. She appears well-developed and well-nourished. No distress.  Cardiovascular: Normal rate, regular rhythm and intact distal pulses.   Murmur heard. Pulmonary/Chest: Effort normal and breath sounds normal. No respiratory distress. She has no wheezes. She has no rales.  Abdominal: Soft. Bowel sounds are normal. She exhibits no distension. There is no tenderness. There is no rebound and no guarding.  Neurological: She is alert and oriented to person, place, and time.  Skin: She is not diaphoretic.  Psychiatric: She has a normal mood and affect. Her behavior is normal.    Lab Results: Basic Metabolic Panel:  Recent Labs Lab 01/02/14 0055 01/03/14 1457  NA 135* 136*  K 3.8 3.9  CL 91* 95*  CO2 24 26  GLUCOSE 140* 118*  BUN 23 42*  CREATININE 2.79* 3.39*  CALCIUM 8.5 8.5  PHOS  --  5.0*   Liver Function Tests:  Recent Labs Lab 01/02/14 0055 01/03/14 1457  AST 61*  --   ALT 30  --   ALKPHOS 146*  --   BILITOT 1.0  --   PROT 8.1  --   ALBUMIN 2.9* 2.3*   CBC:  Recent Labs Lab 01/01/14 1247  01/03/14 1931 01/04/14 0500 01/05/14 0545  WBC 14.0*  < > 11.4* 8.0 7.9  NEUTROABS 12.0*  --  9.1*  --   --   HGB 10.4*  < > 9.3* 9.2* 9.2*  HCT 34.8*   < > 30.1* 30.8* 31.8*  MCV 88.5  < > 86.7 88.3 89.8  PLT 387  < > 398 367 359  < > = values in this interval not displayed. Cardiac Enzymes:  Recent Labs Lab 01/02/14 1902 01/03/14 0016 01/03/14 1100  TROPONINI 1.53* 1.64* 1.00*   CBG:  Recent Labs Lab 01/03/14 2057 01/04/14 0602 01/04/14 1129 01/04/14 1658 01/04/14 2306 01/05/14 0606  GLUCAP 109* 124* 180* 169* 132* 135*   Micro Results: Recent Results (from the past 240 hour(s))  MRSA PCR SCREENING     Status: None   Collection Time    01/01/14  9:22 PM      Result Value Ref Range Status   MRSA by PCR NEGATIVE  NEGATIVE Final   Comment:            The GeneXpert MRSA Assay (FDA     approved for NASAL specimens     only), is one component of a     comprehensive MRSA colonization     surveillance program. It is not     intended to diagnose MRSA  infection nor to guide or     monitor treatment for     MRSA infections.  URINE CULTURE     Status: None   Collection Time    01/02/14  8:15 PM      Result Value Ref Range Status   Specimen Description URINE, RANDOM   Final   Special Requests NONE   Final   Culture  Setup Time     Final   Value: 01/03/2014 08:00     Performed at [facility] Count     Final   Value: NO GROWTH     Performed at [facility]   Culture     Final   Value: NO GROWTH     Performed at [facility]   Report Status 01/04/2014 FINAL   Final  CULTURE, BLOOD (ROUTINE X 2)     Status: None   Collection Time    01/02/14 11:40 PM      Result Value Ref Range Status   Specimen Description BLOOD RIGHT ANTECUBITAL   Final   Special Requests BOTTLES DRAWN AEROBIC AND ANAEROBIC 10CC   Final   Culture  Setup Time     Final   Value: 01/03/2014 03:07     Performed at [facility]   Culture     Final   Value:        BLOOD CULTURE RECEIVED NO GROWTH TO DATE CULTURE WILL BE HELD FOR 5 DAYS BEFORE ISSUING A FINAL NEGATIVE REPORT     Performed at [facility]   Report Status PENDING   Incomplete  CULTURE, BLOOD (ROUTINE X 2)     Status: None   Collection Time    01/02/14 11:48 PM      Result Value Ref Range Status   Specimen Description BLOOD RIGHT HAND   Final   Special Requests BOTTLES DRAWN AEROBIC ONLY 10CC   Final   Culture  Setup Time     Final   Value: 01/03/2014 03:07     Performed at [facility]   Culture     Final   Value:        BLOOD CULTURE RECEIVED NO GROWTH TO DATE CULTURE WILL BE HELD FOR 5 DAYS BEFORE ISSUING A FINAL NEGATIVE REPORT     Performed at [facility]   Report Status PENDING   Incomplete   Studies/Results: No results found. Medications: I have reviewed the patient's current medications. Scheduled Meds: . ARIPiprazole  5 mg Oral QHS  . aspirin EC  81 mg Oral Daily  . atorvastatin  80 mg Oral q1800  . budesonide-formoterol  2 puff Inhalation BID  . carvedilol  6.25 mg Oral BID WC  . citalopram  10 mg Oral Daily  . feeding supplement (RESOURCE BREEZE)  1 Container Oral BID PC  . heparin subcutaneous  5,000 Units Subcutaneous 3 times per day  . heparin  6,000 Units Dialysis Once in dialysis  . insulin aspart  0-9 Units Subcutaneous TID WC  . pantoprazole  40 mg Oral Daily  . senna-docusate  1 tablet Oral BID  . sodium chloride  3 mL Intravenous Q12H   Continuous Infusions:  PRN Meds:.sodium chloride, sodium chloride, sodium chloride, sodium chloride, sodium chloride, sodium chloride, acetaminophen, acetaminophen, alteplase, bisacodyl, feeding supplement (NEPRO CARB STEADY), feeding supplement (NEPRO CARB STEADY), heparin, heparin, [START ON 01/06/2014] heparin, ipratropium-albuterol, lidocaine (PF), lidocaine (PF), lidocaine-prilocaine, lidocaine-prilocaine, oxyCODONE, oxyCODONE-acetaminophen, pentafluoroprop-tetrafluoroeth pentafluoroprop-tetrafluoroeth Assessment/Plan: Principal Problem:  NSTEMI (non-ST elevated myocardial infarction) Active Problems:   Diabetes   Acute on  chronic combined systolic and diastolic heart failure, NYHA class 4   Severe mitral regurgitation   End stage kidney disease   CCF (congestive cardiac failure)   Sacrum and coccyx fracture   Altered mental state   Chronic systolic heart failure  Left Calf Tenderness No swelling. Low likelihood of DVT, but will r/o with LE dopplers. - f/u LE dopplers  NSTEMI in setting of severe sCHF and severe MR  Resolved.  - Card rec medical management as nuclear stress was similar to previous and has signed off - ASA, BB (coreg 6.25 mg BID) - tele  Sacral and pelvic Fractures Pain is slightly better. Continues to wake at night. - Continue percocet 1-2 tablets q 6 hr prn , oxycodine IR for breaktrhough pain. - D/C 1 mg morphine IV q 4 hr prn. - Consulted Ortho, rec WBAT and PT - PT rec SNF - Senokot and dulcolax scheduled.  UTI Patients UA concerning for UTI on admission. Patient given ceftriaxone. Urine Culture negative. Discontinued abx.  ESRD  Patient appeared to have fluid overload that contributed to her hypoxic respiratory failure. HD per nephrology. Acute heart failure appears to have resolved with initiation of HD. - vascular surgery evaluated fistula and rec patient to follow up with home vasc sx. - duplex performed while patient inpatient.  COPD  Patient appears to have stable COPD. However, hypoxia is likely multifactorial and COPD may be contributing.  - duonebs (xopenex) q 6 h prn  - Continue home Symbicort   DM  On lantus 25 U BID  SSI   Diet: Heart carb mod  DVT: Heparin sq  Dispo: Anticipate discharge to SNF after HD on Monday.  The patient does have a current PCP (No Pcp Per Patient) and [facility] after discharge.  The patient does have transportation limitations that hinder transportation to clinic appointments.  .Services Needed at time of discharge: Y = Yes, Blank = No PT:   OT:   RN:   Equipment:   Other:      LOS: 4 days   Pleas Kochhristopher Lillyahna Hemberger, MD 01/05/2014, 7:19 AM

## 2014-01-06 DIAGNOSIS — M79609 Pain in unspecified limb: Secondary | ICD-10-CM

## 2014-01-06 LAB — GLUCOSE, CAPILLARY
GLUCOSE-CAPILLARY: 150 mg/dL — AB (ref 70–99)
Glucose-Capillary: 162 mg/dL — ABNORMAL HIGH (ref 70–99)
Glucose-Capillary: 199 mg/dL — ABNORMAL HIGH (ref 70–99)
Glucose-Capillary: 134 mg/dL — ABNORMAL HIGH (ref 70–99)
Glucose-Capillary: 168 mg/dL — ABNORMAL HIGH (ref 70–99)

## 2014-01-06 LAB — CBC
HEMATOCRIT: 38.6 % (ref 36.0–46.0)
HEMOGLOBIN: 11.6 g/dL — AB (ref 12.0–15.0)
MCH: 26.8 pg (ref 26.0–34.0)
MCHC: 30.1 g/dL (ref 30.0–36.0)
MCV: 89.1 fL (ref 78.0–100.0)
Platelets: 386 10*3/uL (ref 150–400)
RBC: 4.33 MIL/uL (ref 3.87–5.11)
RDW: 18.7 % — ABNORMAL HIGH (ref 11.5–15.5)
WBC: 7.7 10*3/uL (ref 4.0–10.5)

## 2014-01-06 NOTE — Progress Notes (Signed)
*  Preliminary Results* Left lower extremity venous duplex completed. Left lower extremity is negative for deep vein thrombosis. There is no evidence of left Baker's cyst.  01/06/2014 11:36 AM  Gertie Fey, RVT, RDCS, RDMS

## 2014-01-06 NOTE — Progress Notes (Signed)
Subjective:  Pain control improved. Patient complaining of intermittent dizziness. She states that it feels like the work is spinning around her. It seems to be related to her head position.  Objective: Vital signs in last 24 hours: Filed Vitals:   01/06/14 0340 01/06/14 0556 01/06/14 0842 01/06/14 0928  BP: 149/69 148/77  141/84  Pulse: 97 98    Temp: 98.1 F (36.7 C) 98.3 F (36.8 C)    TempSrc: Oral Oral    Resp: 18 19    Height:      Weight:      SpO2: 99% 99% 95%    Weight change:   Intake/Output Summary (Last 24 hours) at 01/06/14 1147 Last data filed at 01/06/14 0817  Gross per 24 hour  Intake   1200 ml  Output     50 ml  Net   1150 ml   Physical Exam  Constitutional: She is oriented to person, place, and time. She appears well-developed and well-nourished. No distress.  Cardiovascular: Normal rate, regular rhythm and intact distal pulses.   Murmur heard. Pulmonary/Chest: Effort normal and breath sounds normal. No respiratory distress. She has no wheezes. She has no rales.  Abdominal: Soft. Bowel sounds are normal. She exhibits no distension. There is no tenderness. There is no rebound and no guarding.  Neurological: She is alert and oriented to person, place, and time.  Skin: She is not diaphoretic.  Psychiatric: She has a normal mood and affect. Her behavior is normal.    Lab Results: Basic Metabolic Panel:  Recent Labs Lab 01/02/14 0055 01/03/14 1457  NA 135* 136*  K 3.8 3.9  CL 91* 95*  CO2 24 26  GLUCOSE 140* 118*  BUN 23 42*  CREATININE 2.79* 3.39*  CALCIUM 8.5 8.5  PHOS  --  5.0*   Liver Function Tests:  Recent Labs Lab 01/02/14 0055 01/03/14 1457  AST 61*  --   ALT 30  --   ALKPHOS 146*  --   BILITOT 1.0  --   PROT 8.1  --   ALBUMIN 2.9* 2.3*   CBC:  Recent Labs Lab 01/01/14 1247  01/03/14 1931  01/05/14 0545 01/06/14 0425  WBC 14.0*  < > 11.4*  < > 7.9 7.7  NEUTROABS 12.0*  --  9.1*  --   --   --   HGB 10.4*  < > 9.3*   < > 9.2* 11.6*  HCT 34.8*  < > 30.1*  < > 31.8* 38.6  MCV 88.5  < > 86.7  < > 89.8 89.1  PLT 387  < > 398  < > 359 386  < > = values in this interval not displayed. Cardiac Enzymes:  Recent Labs Lab 01/02/14 1902 01/03/14 0016 01/03/14 1100  TROPONINI 1.53* 1.64* 1.00*   CBG:  Recent Labs Lab 01/05/14 0606 01/05/14 1132 01/05/14 1559 01/05/14 2056 01/06/14 0332 01/06/14 0638  GLUCAP 135* 152* 148* 197* 150* 162*   Micro Results: Recent Results (from the past 240 hour(s))  MRSA PCR SCREENING     Status: None   Collection Time    01/01/14  9:22 PM      Result Value Ref Range Status   MRSA by PCR NEGATIVE  NEGATIVE Final   Comment:            The GeneXpert MRSA Assay (FDA     approved for NASAL specimens     only), is one component of a     comprehensive MRSA  colonization     surveillance program. It is not     intended to diagnose MRSA     infection nor to guide or     monitor treatment for     MRSA infections.  URINE CULTURE     Status: None   Collection Time    01/02/14  8:15 PM      Result Value Ref Range Status   Specimen Description URINE, RANDOM   Final   Special Requests NONE   Final   Culture  Setup Time     Final   Value: 01/03/2014 08:00     Performed at [facility] Count     Final   Value: NO GROWTH     Performed at [facility]   Culture     Final   Value: NO GROWTH     Performed at [facility]   Report Status 01/04/2014 FINAL   Final  CULTURE, BLOOD (ROUTINE X 2)     Status: None   Collection Time    01/02/14 11:40 PM      Result Value Ref Range Status   Specimen Description BLOOD RIGHT ANTECUBITAL   Final   Special Requests BOTTLES DRAWN AEROBIC AND ANAEROBIC 10CC   Final   Culture  Setup Time     Final   Value: 01/03/2014 03:07     Performed at [facility]   Culture     Final   Value:        BLOOD CULTURE RECEIVED NO GROWTH TO DATE CULTURE WILL BE HELD FOR 5 DAYS BEFORE ISSUING A FINAL  NEGATIVE REPORT     Performed at [facility]   Report Status PENDING   Incomplete  CULTURE, BLOOD (ROUTINE X 2)     Status: None   Collection Time    01/02/14 11:48 PM      Result Value Ref Range Status   Specimen Description BLOOD RIGHT HAND   Final   Special Requests BOTTLES DRAWN AEROBIC ONLY 10CC   Final   Culture  Setup Time     Final   Value: 01/03/2014 03:07     Performed at [facility]   Culture     Final   Value:        BLOOD CULTURE RECEIVED NO GROWTH TO DATE CULTURE WILL BE HELD FOR 5 DAYS BEFORE ISSUING A FINAL NEGATIVE REPORT     Performed at [facility]   Report Status PENDING   Incomplete   Studies/Results: No results found. Medications: I have reviewed the patient's current medications. Scheduled Meds: . ARIPiprazole  5 mg Oral QHS  . aspirin EC  81 mg Oral Daily  . bisacodyl  5 mg Oral Daily  . budesonide-formoterol  2 puff Inhalation BID  . carvedilol  6.25 mg Oral BID WC  . citalopram  10 mg Oral Daily  . darbepoetin (ARANESP) injection - DIALYSIS  60 mcg Intravenous Q Sat-HD  . feeding supplement (RESOURCE BREEZE)  1 Container Oral BID PC  . heparin subcutaneous  5,000 Units Subcutaneous 3 times per day  . insulin aspart  0-9 Units Subcutaneous TID WC  . pantoprazole  40 mg Oral Daily  . senna-docusate  1 tablet Oral BID  . sodium chloride  3 mL Intravenous Q12H   Continuous Infusions:  PRN Meds:.acetaminophen, acetaminophen, ipratropium-albuterol, oxyCODONE, oxyCODONE-acetaminophen Assessment/Plan: Principal Problem:   NSTEMI (non-ST elevated myocardial infarction) Active Problems:   Diabetes  Acute on chronic combined systolic and diastolic heart failure, NYHA class 4   Severe mitral regurgitation   End stage kidney disease   CCF (congestive cardiac failure)   Sacrum and coccyx fracture   Altered mental state   Chronic systolic heart failure  Dizziness likely due to BPPV possible orthostasis. Will check orthostatic  vital signs to ensure no orthostatic hypotension. Appear to be 2/2 inner ear (BPPV). No other lateralizing neurologic findings on physical exam. - PT vestibular eval and treat.  Left Calf Tenderness-resolved No swelling. Low likelihood of DVT, but will r/o with LE dopplers. - LE dopplers negative for DVT.- D/C statin as she has had myalgias previously.  NSTEMI in setting of severe sCHF and severe MR  Resolved.  - Card rec medical management as nuclear stress was similar to previous and has signed off - ASA, BB (coreg 6.25 mg BID) - tele - Patient did not tolerate statin due to myalgia.s  Sacral and pelvic Fractures Pain is slightly better. Continues to wake at night. - Continue percocet 1-2 tablets q 6 hr prn , oxycodine IR for breaktrhough pain. - D/C 1 mg morphine IV q 4 hr prn. - Consulted Ortho, rec WBAT and PT - PT rec SNF - Senokot and dulcolax scheduled.  UTI Patients UA concerning for UTI on admission. Patient given ceftriaxone. Urine Culture negative. Discontinued abx.  ESRD  Patient appeared to have fluid overload that contributed to her hypoxic respiratory failure. HD per nephrology. Acute heart failure appears to have resolved with initiation of HD. - vascular surgery evaluated fistula and rec patient to follow up with home vasc sx. - duplex performed while patient inpatient.  COPD  Patient appears to have stable COPD. However, hypoxia is likely multifactorial and COPD may be contributing.  - duonebs (xopenex) q 6 h prn  - Continue home Symbicort   DM  On lantus 25 U BID  SSI   Diet: Heart carb mod  DVT: Heparin sq  Dispo: Anticipate discharge to SNF after HD on Monday.  The patient does have a current PCP (No Pcp Per Patient) and does not need an [facility] hospital follow-up appointment after discharge.  The patient does have transportation limitations that hinder transportation to clinic appointments.  .Services Needed at time of discharge: Y = Yes, Blank =  No PT:   OT:   RN:   Equipment:   Other:     LOS: 5 days   Pleas Koch, MD 01/06/2014, 11:47 AM

## 2014-01-06 NOTE — Progress Notes (Signed)
Pt refused othostatic VS

## 2014-01-06 NOTE — Progress Notes (Signed)
Subjective: 2K off at HD yesterday, no complaints today .  Pain under good control with pain meds, without the pain meds, "I'm miserable".  My memory "will never be all that great".    Objective: Vital signs in last 24 hours: Temp:  [97.9 F (36.6 C)-98.3 F (36.8 C)] 98.3 F (36.8 C) (05/10 0556) Pulse Rate:  [84-98] 98 (05/10 0556) Resp:  [18-19] 19 (05/10 0556) BP: (108-149)/(52-84) 141/84 mmHg (05/10 0928) SpO2:  [95 %-100 %] 95 % (05/10 0842) Weight change:   Intake/Output from previous day: 05/09 0701 - 05/10 0700 In: 720 [P.O.:720] Out: 2000  Total I/O In: 480 [P.O.:480] Out: 50 [Urine:50] Lab Results:  Recent Labs  01/05/14 0545 01/06/14 0425  WBC 7.9 7.7  HGB 9.2* 11.6*  HCT 31.8* 38.6  PLT 359 386   BMET:   Recent Labs  01/03/14 1457  NA 136*  K 3.9  CL 95*  CO2 26  GLUCOSE 118*  BUN 42*  CREATININE 3.39*  CALCIUM 8.5  ALBUMIN 2.3*   No results found for this basename: PTH,  in the last 72 hours Iron Studies: No results found for this basename: IRON, TIBC, TRANSFERRIN, FERRITIN,  in the last 72 hours  Studies/Results: No results found.  EXAM: Gen:  Alert, in no apparent distress Resp:  CTA without rales, rhonchi, or wheezes Cardio:  RRR with Gr II/VI systolic murmur, no rub GI:  + BS, soft and nontender Extremities:  No edema Access:  R IJ catheter with BFR 400, AVF @ LUA with weak bruit  HD: [facility]  4h 64kg 2/2.0 [facility] Heparin 6000 IJ Cath (maturing LUA AVF)  EPO 5800   Assessment/Plan: 1. Pelvic & sacral fxs - sec to fall; CT with sacral fx, nondisplaced fx of acetabulum & superior pubic ramus; per primary. 2. NSTEMI - Hx CABG, ICM with EF 20%, myoview negative for ischemia. 3. ESRD - HD on TTS @ [facility], K 3.9.  HD today. 4. HTN/Volume - BP 122/76 on Carvedilol; wt 60.5 kg, below EDW. 5. Anemia - Hgb 9.2. Aranesp 60 mcg today.  6. Sec HPT - Ca 8.5 (9.9 corrected), P 5; no Hectorol or binders. 7. Nutrition - Alb 2.3, renal diet,  vitamin. 8. Access - using IJ catheter, AVF maturing poorly, follow-up with Dr. Sondra Come in [facility] after dc. 9. DM - on insulin 10. EOL- pt w prgoressive physical and cognitive decline, she has requested DNR status 11. Memory loss- mild to moderate  Kathleen Moselle MD (pgr) [phone]    (c) [phone] 01/06/2014, 2:01 PM

## 2014-01-06 NOTE — Progress Notes (Signed)
Patient called out wanting to be pulled up in bed.  When helping patient, she felt cool and clammy to touch. VSS. CBG 150. Patient knew who she was and that she was in the hospital but didn't know the time and was asking where her husband went.  Re-oriented patient to time and situation and will continue to monitor patient.

## 2014-01-07 DIAGNOSIS — R42 Dizziness and giddiness: Secondary | ICD-10-CM

## 2014-01-07 DIAGNOSIS — M79609 Pain in unspecified limb: Secondary | ICD-10-CM

## 2014-01-07 LAB — CBC
HCT: 38.7 % (ref 36.0–46.0)
Hemoglobin: 11.5 g/dL — ABNORMAL LOW (ref 12.0–15.0)
MCH: 26.3 pg (ref 26.0–34.0)
MCHC: 29.7 g/dL — ABNORMAL LOW (ref 30.0–36.0)
MCV: 88.4 fL (ref 78.0–100.0)
Platelets: 453 10*3/uL — ABNORMAL HIGH (ref 150–400)
RBC: 4.38 MIL/uL (ref 3.87–5.11)
RDW: 18.8 % — AB (ref 11.5–15.5)
WBC: 10.8 10*3/uL — AB (ref 4.0–10.5)

## 2014-01-07 LAB — GLUCOSE, CAPILLARY
GLUCOSE-CAPILLARY: 167 mg/dL — AB (ref 70–99)
GLUCOSE-CAPILLARY: 205 mg/dL — AB (ref 70–99)

## 2014-01-07 MED ORDER — OXYCODONE HCL 5 MG PO TABS: 5.0000 mg | ORAL_TABLET | Freq: Four times a day (QID) | ORAL | Status: DC | PRN

## 2014-01-07 MED ORDER — OXYCODONE-ACETAMINOPHEN 5-325 MG PO TABS: 1.0000 | ORAL_TABLET | Freq: Four times a day (QID) | ORAL | Status: DC | PRN

## 2014-01-07 MED ORDER — LORAZEPAM 0.5 MG PO TABS
0.5000 mg | ORAL_TABLET | Freq: Two times a day (BID) | ORAL | Status: DC | PRN
  Administered 2014-01-07: 0.5 mg via ORAL
  Filled 2014-01-07: qty 1

## 2014-01-07 MED ORDER — BISACODYL 5 MG PO TBEC: 5.0000 mg | DELAYED_RELEASE_TABLET | Freq: Every day | ORAL | Status: DC | PRN

## 2014-01-07 NOTE — Progress Notes (Signed)
Patient seen and examined with Dr. Glendell Docker. Feels well today.  No CP, no sob, no abd pain. Still with back pain but better controlled. C/o Mild L calf pain  Cardiovascular- RRR, systolic murmur + Pulm- CTA b/l Abd- soft, non tender bowel sounds + Ext- No pedal edema Gen- AAO*3  Assessment and Plan: I have reviewed Dr. Louann Liv note and agree with the documentation as outlined with the following additions: - LE dopplers negative for DVT.  - Continue with cardiac meds. Outpatient cardio follow up. No evidence of active ischemia on myoview - c/w pain control for sacral fractures - Will need HD on Wed. No need to dialyse today per renal - Pt stable for d/c to SNF today - Case d/w patient and Dr. Glendell Docker in detail

## 2014-01-07 NOTE — Progress Notes (Signed)
Subjective:  Pain control improved. Dizziness has resolved.  Objective: Vital signs in last 24 hours: Filed Vitals:   01/06/14 0928 01/06/14 1414 01/06/14 2019 01/07/14 0606  BP: 141/84 120/61 123/61 135/61  Pulse:  84 84 109  Temp:  97 F (36.1 C) 97.8 F (36.6 C) 97.5 F (36.4 C)  TempSrc:  Oral Oral Oral  Resp:  18 18 17   Height:      Weight:    127 lb 3.3 oz (57.7 kg)  SpO2:  98% 96% 96%   Weight change:   Intake/Output Summary (Last 24 hours) at 01/07/14 1115 Last data filed at 01/07/14 [id number]  Gross per 24 hour  Intake    720 ml  Output      0 ml  Net    720 ml   Physical Exam  Constitutional: She is oriented to person, place, and time. She appears well-developed and well-nourished. No distress.  Cardiovascular: Normal rate, regular rhythm and intact distal pulses.   Murmur heard. Pulmonary/Chest: Effort normal and breath sounds normal. No respiratory distress. She has no wheezes. She has no rales.  Abdominal: Soft. Bowel sounds are normal. She exhibits no distension. There is no tenderness. There is no rebound and no guarding.  Neurological: She is alert and oriented to person, place, and time.  Skin: She is not diaphoretic.  Psychiatric: She has a normal mood and affect. Her behavior is normal.    Lab Results: Basic Metabolic Panel:  Recent Labs Lab 01/02/14 0055 01/03/14 1457  NA 135* 136*  K 3.8 3.9  CL 91* 95*  CO2 24 26  GLUCOSE 140* 118*  BUN 23 42*  CREATININE 2.79* 3.39*  CALCIUM 8.5 8.5  PHOS  --  5.0*   Liver Function Tests:  Recent Labs Lab 01/02/14 0055 01/03/14 1457  AST 61*  --   ALT 30  --   ALKPHOS 146*  --   BILITOT 1.0  --   PROT 8.1  --   ALBUMIN 2.9* 2.3*   CBC:  Recent Labs Lab 01/01/14 1247  01/03/14 1931  01/06/14 0425 01/07/14 0435  WBC 14.0*  < > 11.4*  < > 7.7 10.8*  NEUTROABS 12.0*  --  9.1*  --   --   --   HGB 10.4*  < > 9.3*  < > 11.6* 11.5*  HCT 34.8*  < > 30.1*  < > 38.6 38.7  MCV 88.5  < > 86.7   < > 89.1 88.4  PLT 387  < > 398  < > 386 453*  < > = values in this interval not displayed. Cardiac Enzymes:  Recent Labs Lab 01/02/14 1902 01/03/14 0016 01/03/14 1100  TROPONINI 1.53* 1.64* 1.00*   CBG:  Recent Labs Lab 01/06/14 0332 01/06/14 [id number] 01/06/14 1128 01/06/14 1632 01/06/14 2140 01/07/14 0742  GLUCAP 150* 162* 199* 134* 168* 167*   Micro Results: Recent Results (from the past 240 hour(s))  MRSA PCR SCREENING     Status: None   Collection Time    01/01/14  9:22 PM      Result Value Ref Range Status   MRSA by PCR NEGATIVE  NEGATIVE Final   Comment:            The GeneXpert MRSA Assay (FDA     approved for NASAL specimens     only), is one component of a     comprehensive MRSA colonization     surveillance program. It is not  intended to diagnose MRSA     infection nor to guide or     monitor treatment for     MRSA infections.  URINE CULTURE     Status: None   Collection Time    01/02/14  8:15 PM      Result Value Ref Range Status   Specimen Description URINE, RANDOM   Final   Special Requests NONE   Final   Culture  Setup Time     Final   Value: 01/03/2014 08:00     Performed at [facility] Count     Final   Value: NO GROWTH     Performed at [facility]   Culture     Final   Value: NO GROWTH     Performed at [facility]   Report Status 01/04/2014 FINAL   Final  CULTURE, BLOOD (ROUTINE X 2)     Status: None   Collection Time    01/02/14 11:40 PM      Result Value Ref Range Status   Specimen Description BLOOD RIGHT ANTECUBITAL   Final   Special Requests BOTTLES DRAWN AEROBIC AND ANAEROBIC 10CC   Final   Culture  Setup Time     Final   Value: 01/03/2014 03:07     Performed at [facility]   Culture     Final   Value:        BLOOD CULTURE RECEIVED NO GROWTH TO DATE CULTURE WILL BE HELD FOR 5 DAYS BEFORE ISSUING A FINAL NEGATIVE REPORT     Performed at [facility]   Report Status PENDING    Incomplete  CULTURE, BLOOD (ROUTINE X 2)     Status: None   Collection Time    01/02/14 11:48 PM      Result Value Ref Range Status   Specimen Description BLOOD RIGHT HAND   Final   Special Requests BOTTLES DRAWN AEROBIC ONLY 10CC   Final   Culture  Setup Time     Final   Value: 01/03/2014 03:07     Performed at [facility]   Culture     Final   Value:        BLOOD CULTURE RECEIVED NO GROWTH TO DATE CULTURE WILL BE HELD FOR 5 DAYS BEFORE ISSUING A FINAL NEGATIVE REPORT     Performed at [facility]   Report Status PENDING   Incomplete   Studies/Results: No results found. Medications: I have reviewed the patient's current medications. Scheduled Meds: . ARIPiprazole  5 mg Oral QHS  . aspirin EC  81 mg Oral Daily  . bisacodyl  5 mg Oral Daily  . budesonide-formoterol  2 puff Inhalation BID  . carvedilol  6.25 mg Oral BID WC  . citalopram  10 mg Oral Daily  . darbepoetin (ARANESP) injection - DIALYSIS  60 mcg Intravenous Q Sat-HD  . feeding supplement (RESOURCE BREEZE)  1 Container Oral BID PC  . heparin subcutaneous  5,000 Units Subcutaneous 3 times per day  . insulin aspart  0-9 Units Subcutaneous TID WC  . pantoprazole  40 mg Oral Daily  . senna-docusate  1 tablet Oral BID  . sodium chloride  3 mL Intravenous Q12H   Continuous Infusions:  PRN Meds:.acetaminophen, acetaminophen, ipratropium-albuterol, oxyCODONE, oxyCODONE-acetaminophen Assessment/Plan: Principal Problem:   NSTEMI (non-ST elevated myocardial infarction) Active Problems:   Diabetes   Acute on chronic combined systolic and diastolic heart failure, NYHA class 4  Severe mitral regurgitation   End stage kidney disease   CCF (congestive cardiac failure)   Sacrum and coccyx fracture   Altered mental state   Chronic systolic heart failure  Dizziness-resolved. Patient may have been mildy hypovolemic. She refused orthostatic VS.  Left Calf Tenderness-resolved No swelling. Low likelihood of DVT,  but will r/o with LE dopplers. - LE dopplers negative for DVT.- D/C statin as she has had myalgias previously.  NSTEMI in setting of severe sCHF and severe MR  Resolved.  - Card rec medical management as nuclear stress was similar to previous and has signed off - ASA, BB (coreg 6.25 mg BID) - tele - Patient did not tolerate statin due to myalgia.s  Sacral and pelvic Fractures Pain is slightly better. Continues to wake at night. - Continue percocet 1-2 tablets q 6 hr prn , oxycodine IR for breaktrhough pain. - D/C 1 mg morphine IV q 4 hr prn. - Consulted Ortho, rec WBAT and PT - PT rec SNF - Senokot and dulcolax scheduled.  UTI Patients UA concerning for UTI on admission. Patient given ceftriaxone. Urine Culture negative. Discontinued abx.  ESRD  Patient appeared to have fluid overload that contributed to her hypoxic respiratory failure. HD per nephrology. Acute heart failure appears to have resolved with initiation of HD. - vascular surgery evaluated fistula and rec patient to follow up with home vasc sx. - duplex performed while patient inpatient.  COPD  Patient appears to have stable COPD. However, hypoxia is likely multifactorial and COPD may be contributing.  - duonebs (xopenex) q 6 h prn  - Continue home Symbicort   DM  On lantus 25 U BID  SSI   Diet: Heart carb mod  DVT: Heparin sq  Dispo: Anticipate discharge to SNF after HD on Monday.  The patient does have a current PCP (No Pcp Per Patient) and does not need an [facility] hospital follow-up appointment after discharge.  The patient does have transportation limitations that hinder transportation to clinic appointments.  .Services Needed at time of discharge: Y = Yes, Blank = No PT:   OT:   RN:   Equipment:   Other:     LOS: 6 days   Pleas Koch, MD 01/07/2014, 11:15 AM

## 2014-01-07 NOTE — Progress Notes (Signed)
Physical Therapy Treatment Patient Details Name: Kathleen [name] MRN: [id number] DOB: 1951-08-22 Today's Date: 01/07/2014    History of Present Illness Adm 01/01/14 s/p fall at home (walking with her walker). Pt with multiple Rt pelvic fractures (inferior pubic ramus, at junction of superior pubic ramus and acetabulum, sacral ala) and S2 vertebral body fracture. ?NSTEMI (undergoing cardiac workup). Pt developed AMS with likely UTI. PMHx- DM, ESRD on HD, HTN, COPD, cardiomyopathy, CABG, Rt hip ORIF     PT Comments    Pt admitted with above. Pt currently with functional limitations due to balance and endurance deficits.  Pt will benefit from skilled PT to increase their independence and safety with mobility to allow discharge to the venue listed below.   Follow Up Recommendations  SNF;Supervision/Assistance - 24 hour     Equipment Recommendations  None recommended by PT    Recommendations for Other Services       Precautions / Restrictions Precautions Precautions: Fall Restrictions Weight Bearing Restrictions: No    Mobility  Bed Mobility Overal bed mobility: Needs Assistance;+2 for physical assistance Bed Mobility: Supine to Sit     Supine to sit: Mod assist;HOB elevated;+2 for physical assistance     General bed mobility comments: Assist for pt to scoot laterally to EOB and then to pull her trunk OOB in order to obtain upright sitting position EOB.  Requires incr  time and step by step cues.  Transfers Overall transfer level: Needs assistance Equipment used: Rolling walker (2 wheeled) Transfers: Sit to/from [employer] Sit to Stand: Mod assist;+2 physical assistance Stand pivot transfers: Mod assist;+2 physical assistance       General transfer comment: Pt pulled up on RW in order to stand (anxious and allowed; anchored RW). Cues to slow down. Pt with very narrow base of support and very small steps to pivot.  Ambulation/Gait                  Stairs            Wheelchair Mobility    Modified Rankin (Stroke Patients Only)       Balance Overall balance assessment: Needs assistance;History of Falls Sitting-balance support: Bilateral upper extremity supported;Feet supported Sitting balance-Leahy Scale: Poor Sitting balance - Comments: bil UE assist also due to pain   Standing balance support: Bilateral upper extremity supported;During functional activity Standing balance-Leahy Scale: Poor Standing balance comment: using RW.  Pt needing Rw and therapist assit for stability.                    Cognition Arousal/Alertness: Awake/alert Behavior During Therapy: Anxious;Impulsive Overall Cognitive Status: No family/caregiver present to determine baseline cognitive functioning Area of Impairment: Following commands;Problem solving;Safety/judgement;Memory     Memory: Decreased short-term memory Following Commands: Follows multi-step commands inconsistently Safety/Judgement: Decreased awareness of safety   Problem Solving: Slow processing;Decreased initiation;Requires verbal cues;Requires tactile cues General Comments: Pt oriented x4 but gets easily overwhelmed with further questions (such as PLOF). Pt very anxious (and even tearful) throughout session and becomes impulsive with mobility.    Exercises General Exercises - Lower Extremity Ankle Circles/Pumps: AROM;AAROM;Both;10 reps;Supine Quad Sets: AROM;Both;10 reps;Supine Heel Slides: AROM;AAROM;Both;10 reps;Supine Hip ABduction/ADduction: AAROM;Right;5 reps;Supine    General Comments General comments (skin integrity, edema, etc.): Assessed for BPPV with positive test for left BPPV.  Treated wtih Epley maneuver with symptoms after per pt better.        Pertinent Vitals/Pain VSS, pain bil hips per pt but not rated.  Home Living                      Prior Function            PT Goals (current goals can now be found in the care plan  section) Progress towards PT goals: Progressing toward goals    Frequency  Min 3X/week    PT Plan Current plan remains appropriate    Co-evaluation             End of Session Equipment Utilized During Treatment: Gait belt;Oxygen Activity Tolerance:  (limited by anxiety) Patient left: in chair;with call bell/phone within reach;with chair alarm set     Time: [id number] PT Time Calculation (min): 28 min  Charges:  $Therapeutic Activity: 8-22 mins $Canalith Rep Proc: 8-22 mins                    G Codes:      Barb MerinoDawn Ingold 01/07/2014, 1:38 PM [facility]Dawn Ingold,PT Acute Rehabilitation [phone] (pager)

## 2014-01-07 NOTE — Progress Notes (Signed)
[facility] ROUNDING NOTE   Subjective:   Interval History: no complaints this morning  Objective:  Vital signs in last 24 hours:  Temp:  [97 F (36.1 C)-97.8 F (36.6 C)] 97.5 F (36.4 C) (05/11 0606) Pulse Rate:  [84-109] 109 (05/11 0606) Resp:  [17-18] 17 (05/11 0606) BP: (120-141)/(61-84) 135/61 mmHg (05/11 0606) SpO2:  [96 %-98 %] 96 % (05/11 0606) Weight:  [57.7 kg (127 lb 3.3 oz)] 57.7 kg (127 lb 3.3 oz) (05/11 0606)  Weight change:  Filed Weights   01/05/14 0609 01/05/14 0655 01/07/14 0606  Weight: 60.7 kg (133 lb 13.1 oz) 60.5 kg (133 lb 6.1 oz) 57.7 kg (127 lb 3.3 oz)    Intake/Output: I/O last 3 completed shifts: In: 960 [P.O.:960] Out: 50 [Urine:50]   Intake/Output this shift:  Total I/O In: 240 [P.O.:240] Out: -   Gen: Alert, in no apparent distress  Resp: CTA without rales, rhonchi, or wheezes  Cardio: RRR with Gr II/VI systolic murmur, no rub  GI: + BS, soft and nontender  Extremities: No edema  Access: R IJ catheter with BFR 400, AVF @ LUA with weak bruit    Basic Metabolic Panel:  Recent Labs Lab 01/01/14 1247 01/01/14 1333 01/02/14 0055 01/03/14 1457  NA 135* 135* 135* 136*  K 5.0 4.7 3.8 3.9  CL 93* 99 91* 95*  CO2 20  --  24 26  GLUCOSE 206* 202* 140* 118*  BUN 38* 37* 23 42*  CREATININE 4.38* 4.70* 2.79* 3.39*  CALCIUM 9.2  --  8.5 8.5  PHOS  --   --   --  5.0*    Liver Function Tests:  Recent Labs Lab 01/02/14 0055 01/03/14 1457  AST 61*  --   ALT 30  --   ALKPHOS 146*  --   BILITOT 1.0  --   PROT 8.1  --   ALBUMIN 2.9* 2.3*   No results found for this basename: LIPASE, AMYLASE,  in the last 168 hours No results found for this basename: AMMONIA,  in the last 168 hours  CBC:  Recent Labs Lab 01/01/14 1247  01/03/14 1931 01/04/14 0500 01/05/14 0545 01/06/14 0425 01/07/14 0435  WBC 14.0*  < > 11.4* 8.0 7.9 7.7 10.8*  NEUTROABS 12.0*  --  9.1*  --   --   --   --   HGB 10.4*  < > 9.3* 9.2* 9.2*  11.6* 11.5*  HCT 34.8*  < > 30.1* 30.8* 31.8* 38.6 38.7  MCV 88.5  < > 86.7 88.3 89.8 89.1 88.4  PLT 387  < > 398 367 359 386 453*  < > = values in this interval not displayed.  Cardiac Enzymes:  Recent Labs Lab 01/02/14 0100 01/02/14 0630 01/02/14 1902 01/03/14 0016 01/03/14 1100  TROPONINI 2.30* 1.78* 1.53* 1.64* 1.00*    BNP: No components found with this basename: POCBNP,   CBG:  Recent Labs Lab 01/06/14 0638 01/06/14 1128 01/06/14 1632 01/06/14 2140 01/07/14 0742  GLUCAP 162* 199* 134* 168* 167*    Microbiology: Results for orders placed during the hospital encounter of 01/01/14  MRSA PCR SCREENING     Status: None   Collection Time    01/01/14  9:22 PM      Result Value Ref Range Status   MRSA by PCR NEGATIVE  NEGATIVE Final   Comment:            The GeneXpert MRSA Assay (FDA     approved for NASAL  specimens     only), is one component of a     comprehensive MRSA colonization     surveillance program. It is not     intended to diagnose MRSA     infection nor to guide or     monitor treatment for     MRSA infections.  URINE CULTURE     Status: None   Collection Time    01/02/14  8:15 PM      Result Value Ref Range Status   Specimen Description URINE, RANDOM   Final   Special Requests NONE   Final   Culture  Setup Time     Final   Value: 01/03/2014 08:00     Performed at [facility] Count     Final   Value: NO GROWTH     Performed at [facility]   Culture     Final   Value: NO GROWTH     Performed at [facility]   Report Status 01/04/2014 FINAL   Final  CULTURE, BLOOD (ROUTINE X 2)     Status: None   Collection Time    01/02/14 11:40 PM      Result Value Ref Range Status   Specimen Description BLOOD RIGHT ANTECUBITAL   Final   Special Requests BOTTLES DRAWN AEROBIC AND ANAEROBIC 10CC   Final   Culture  Setup Time     Final   Value: 01/03/2014 03:07     Performed at [facility]   Culture      Final   Value:        BLOOD CULTURE RECEIVED NO GROWTH TO DATE CULTURE WILL BE HELD FOR 5 DAYS BEFORE ISSUING A FINAL NEGATIVE REPORT     Performed at [facility]   Report Status PENDING   Incomplete  CULTURE, BLOOD (ROUTINE X 2)     Status: None   Collection Time    01/02/14 11:48 PM      Result Value Ref Range Status   Specimen Description BLOOD RIGHT HAND   Final   Special Requests BOTTLES DRAWN AEROBIC ONLY 10CC   Final   Culture  Setup Time     Final   Value: 01/03/2014 03:07     Performed at [facility]   Culture     Final   Value:        BLOOD CULTURE RECEIVED NO GROWTH TO DATE CULTURE WILL BE HELD FOR 5 DAYS BEFORE ISSUING A FINAL NEGATIVE REPORT     Performed at [facility]   Report Status PENDING   Incomplete    Coagulation Studies: No results found for this basename: LABPROT, INR,  in the last 72 hours  Urinalysis: No results found for this basename: COLORURINE, APPERANCEUR, LABSPEC, PHURINE, GLUCOSEU, HGBUR, BILIRUBINUR, KETONESUR, PROTEINUR, UROBILINOGEN, NITRITE, LEUKOCYTESUR,  in the last 72 hours    Imaging: No results found.   Medications:     . ARIPiprazole  5 mg Oral QHS  . aspirin EC  81 mg Oral Daily  . bisacodyl  5 mg Oral Daily  . budesonide-formoterol  2 puff Inhalation BID  . carvedilol  6.25 mg Oral BID WC  . citalopram  10 mg Oral Daily  . darbepoetin (ARANESP) injection - DIALYSIS  60 mcg Intravenous Q Sat-HD  . feeding supplement (RESOURCE BREEZE)  1 Container Oral BID PC  . heparin subcutaneous  5,000 Units Subcutaneous 3 times per day  .  insulin aspart  0-9 Units Subcutaneous TID WC  . pantoprazole  40 mg Oral Daily  . senna-docusate  1 tablet Oral BID  . sodium chloride  3 mL Intravenous Q12H   acetaminophen, acetaminophen, ipratropium-albuterol, oxyCODONE, oxyCODONE-acetaminophen  Assessment/ Plan:   1. Pelvic & sacral fxs - sec to fall; CT with sacral fx, nondisplaced fx of acetabulum & superior pubic  ramus; plan to discharge to nursing home in [place] 2. NSTEMI - Hx CABG, ICM with EF 20%, myoview negative for ischemia. 3. ESRD - HD on TTS @ [facility], K 3.9.  4. HTN/Volume - BP 122/76 on Carvedilol; wt 60.5 kg, below EDW. 5. Anemia - Hgb 11.5 6. Sec HPT - Ca 8.5 (9.9 corrected), P 5; no Hectorol or binders. 7. Nutrition - Alb 2.3, renal diet, vitamin. 8. Access - using IJ catheter, AVF maturing poorly, [facility]. 9. DM - on insulin 10. EOL- pt w prgoressive physical and cognitive decline, she has requested DNR status 11. Memory loss- mild to moderate  Discharging patient to Rehab  Next dialysis planned for Wednesday    LOS: 6 Garnetta BuddyMartin W Abdulrahman Bracey @TODAY @8 :56 AM

## 2014-01-07 NOTE — Progress Notes (Signed)
Report given to Dewayne Hatch at [facility].

## 2014-01-07 NOTE — Care Management Note (Signed)
Patient is active with [facility] for [facility].  Resumption of care orders requested upon discharge.

## 2014-01-07 NOTE — Clinical Social Work Placement (Signed)
     Clinical Social Work Department CLINICAL SOCIAL WORK PLACEMENT NOTE 01/07/2014  Patient:  ZOEJANE, CZECHOWSKI  Account Number:  [id number] Admit date:  01/01/2014  Clinical Social Worker:  Maryclare Labrador, Theresia Majors  Date/time:  01/03/2014 04:12 PM  Clinical Social Work is seeking post-discharge placement for this patient at the following level of care:   SKILLED NURSING   (*CSW will update this form in Epic as items are completed)     Patient/family provided with [facility] Department of Clinical Social Works list of facilities offering this level of care within the geographic area requested by the patient (or if unable, by the patients family).  01/03/2014  Patient/family informed of their freedom to choose among providers that offer the needed level of care, that participate in Medicare, Medicaid or managed care program needed by the patient, have an available bed and are willing to accept the patient.    Patient/family informed of [facility] ownership interest in [facility], as well as of the fact that they are under no obligation to receive care at this facility.  PASARR submitted to EDS on 01/03/2014 PASARR number received from EDS on 01/03/2014  FL2 transmitted to all facilities in geographic area requested by pt/family on  01/03/2014 FL2 transmitted to all facilities within larger geographic area on   Patient informed that his/her managed care company has contracts with or will negotiate with  certain facilities, including the following:   NA     Patient/family informed of bed offers received:  01/07/2014 Patient chooses bed at [facility] Physician recommends and patient chooses bed at    Patient to be transferred to [facility] on  01/07/2014 Patient to be transferred to facility by Ambulance  Sharin Mons)  The following physician request were entered in Epic:   Additional Comments: 01/07/14 OK per MD for d/c today to SNF. Patient's  husband notified and stated that he was very pelased wtih bed availability at [facility].  Patient was also pelased wtih dc. Nursing notified to call report.  No further CSW needs identified. CSW signing off. Lorri Frederick. Khanh Cordner, LCSWA [phone]

## 2014-01-07 NOTE — Discharge Summary (Signed)
Name: Kathleen [name] MRN: [id number] DOB: 11-Jun-1951 63 y.o. PCP: No Pcp Per Patient  Date of Admission: 01/01/2014 11:44 AM Date of Discharge: 01/07/2014 Attending Physician: Earl Lagos, MD  Discharge Diagnosis:  Principal Problem:   NSTEMI (non-ST elevated myocardial infarction) Active Problems:   Diabetes   Acute on chronic combined systolic and diastolic heart failure, NYHA class 4   Severe mitral regurgitation   End stage kidney disease   CCF (congestive cardiac failure)   Sacrum and coccyx fracture   Altered mental state   Chronic systolic heart failure  Discharge Medications:   Medication List    STOP taking these medications       cephALEXin 500 MG capsule  Commonly known as:  KEFLEX      TAKE these medications       ARIPiprazole 5 MG tablet  Commonly known as:  ABILIFY  Take 5 mg by mouth daily.     aspirin 81 MG tablet  Take 81 mg by mouth daily.     bisacodyl 5 MG EC tablet  Commonly known as:  DULCOLAX  Take 1 tablet (5 mg total) by mouth daily as needed for moderate constipation.     budesonide-formoterol 160-4.5 MCG/ACT inhaler  Commonly known as:  SYMBICORT  Inhale 2 puffs into the lungs 2 (two) times daily.     carvedilol 6.25 MG tablet  Commonly known as:  COREG  Take 1 tablet (6.25 mg total) by mouth 2 (two) times daily with a meal.     citalopram 10 MG tablet  Commonly known as:  CELEXA  Take 1 tablet (10 mg total) by mouth daily.     docusate sodium 100 MG capsule  Commonly known as:  COLACE  Take 100 mg by mouth daily.     insulin glargine 100 UNIT/ML injection  Commonly known as:  LANTUS  Inject 0.25 mLs (25 Units total) into the skin 2 (two) times daily.     ipratropium-albuterol 0.5-2.5 (3) MG/3ML Soln  Commonly known as:  DUONEB  Take 3 mLs by nebulization every 6 (six) hours as needed (shortness of breath).     LORazepam 0.5 MG tablet  Commonly known as:  ATIVAN  Take 0.5 mg by mouth 2 (two) times daily as needed  for anxiety.     nitroGLYCERIN 0.4 MG SL tablet  Commonly known as:  NITROSTAT  Place 0.4 mg under the tongue every 5 (five) minutes as needed for chest pain.     omeprazole 40 MG capsule  Commonly known as:  PRILOSEC  Take 40 mg by mouth daily.     oxyCODONE 5 MG immediate release tablet  Commonly known as:  Oxy IR/ROXICODONE  Take 1 tablet (5 mg total) by mouth every 6 (six) hours as needed for breakthrough pain.     oxyCODONE-acetaminophen 5-325 MG per tablet  Commonly known as:  PERCOCET/ROXICET  Take 1 tablet by mouth every 6 (six) hours as needed for moderate pain or severe pain.     senna 8.6 MG tablet  Commonly known as:  SENOKOT  Take 1 tablet by mouth daily.        Disposition and follow-up:   Ms.Kathleen [name] was discharged from [facility] in Stable condition.  At the hospital follow up visit please address:  1.  Pelvic Fracture, Pain Control, NSTEMI, ESRD  2.  Labs / imaging needed at time of follow-up: BMP  3.  Pending labs/ test needing follow-up: None  Follow-up Appointments:     Follow-up Information   Follow up with GRAVES,JOHN L, MD In 2 weeks.   Specialty:  Orthopedic Surgery   Contact information:   [address] [place] [address] [phone]       Follow up with Hal Morales, NP On 01/16/2014. (2 pm)    Specialty:  Nurse Practitioner   Contact information:   [address] [place] [address] [phone]       Discharge Instructions: Discharge Orders   Future Orders Complete By Expires   Call MD for:  temperature >100.4  As directed    Call MD for:  As directed    Diet - low sodium heart healthy  As directed    Discharge instructions  As directed    Increase activity slowly  As directed       Consultations: Treatment Team:  [name], MD Pryor Ochoa, MD Garnetta Buddy, MD  Procedures Performed:  Dg Sacrum/coccyx  01/01/2014   CLINICAL DATA:  Larey Seat, injury, pain  EXAM: SACRUM AND COCCYX  - 2+ VIEW  COMPARISON:  None.  FINDINGS: Difficult to exclude a mid sacral fracture, as there is slight angulation with suspected lucency through the S2 segment. Noncontrast CT scan of the pelvis recommended for further evaluation. Severe osteopenia limits evaluation. Furthermore moderate stool burden obscures much of the sacrum on the AP view. Previous ORIF for right hip fracture. Degenerative disc disease lumbar spine. Vascular calcification.  IMPRESSION: Cannot exclude mid S2 fracture. CT pelvis without contrast recommended.   Electronically Signed   By: Davonna Belling M.D.   On: 01/01/2014 14:07   Ct Pelvis Wo Contrast  01/01/2014   CLINICAL DATA:  Do a sacral fracture  EXAM: CT PELVIS WITHOUT CONTRAST  TECHNIQUE: Multidetector CT imaging of the pelvis was performed following the standard protocol without intravenous contrast.  COMPARISON:  None.  FINDINGS: There is no hip fracture or dislocation. There is orthopedic hardware in the right hip transfixing a healed intertrochanteric fracture. There is a nondisplaced fracture of the right inferior pubic ramus. There is a nondisplaced fracture at the junction of the right superior acetabulum and superior pubic ramus.  There is a fracture of the S2 vertebral body with mild focal kyphosis. The fracture extends into the right sacral ala. There is a small amount of hemorrhagic fluid anterior to the right piriformis muscle.  There is severe degenerative disc disease with broad-based disc bulge at L4-5.  There is a small fat containing umbilical hernia. There is a small amount of pelvic free fluid. There is abdominal aortic atherosclerosis. There is a Foley catheter within the bladder.  IMPRESSION: 1. S2 vertebral body fracture with focal kyphosis centered at S2. The fracture extends into the right sacral ala. 2. Nondisplaced fracture of the right inferior pubic ramus. 3. Nondisplaced fracture at the junction of the right superior acetabulum and superior pubic ramus.    Electronically Signed   By: Elige Ko   On: 01/01/2014 23:47   Nm Myocar Multi W/spect W/wall Motion / Ef  01/02/2014   CLINICAL DATA:  Chest pain  EXAM: Lexiscan Myovue  TECHNIQUE: The patient received IV Lexiscan .4mg  over 15 seconds. 33.0 mCi of Technetium 13m Sestamibi injected at 30 seconds. Quantitative SPECT images were obtained in the vertical, horizontal and short axis planes after a 45 minute delay. Rest images were obtained with similar planes and delay using 10.2 mCi of Technetium 28m Sestamibi.  FINDINGS: ECG:  No  ischemic changes with Lexiscan infusion.  Symptoms:  No chest pain  Quantitiative Gated SPECT EF: Calcuilated 29% with inferior akinesis. Suspect EF is not that depressed.  Perfusion Images: Large, severe basal to mid inferior perfusion defect seen at both rest and with Lexiscan stress.  IMPRESSION: 1. Fixed basal to mid inferior defect suggestive of prior myocardial infarction. No significant ischemia.  2. EF calculated at 29%, visually does not appear this low. Inferior akinesis. Would suggest echo correlation.  3.  Intermediate risk stress test.  Marca Anconaalton Mclean   Electronically Signed   By: Marca Anconaalton  Mclean M.D.   On: 01/02/2014 17:17   Dg Chest Port 1 View  01/01/2014   CLINICAL DATA:  Shortness of breath and dialysis patient. History of CHF.  EXAM: PORTABLE CHEST - 1 VIEW  COMPARISON:  Chest radiograph 11/13/2013 and 09/04/2013  FINDINGS: Stable cardiomegaly in this patient status post median sternotomy and CABG. The superior most sternotomy wire is fractured, unchanged from chest radiograph dated 11/13/2013. There is mild diffuse pulmonary vascular congestion. Negative for discrete pulmonary edema. Surgical suture is seen in the region of the left upper lung, laterally.  There is irregularity and probable callus formation involving 3 left-sided ribs, suggesting subacute or remote rib fractures. Negative for pneumothorax or pleural effusion.  IMPRESSION: Cardiomegaly and pulmonary  vascular congestion.  Prior CABG.  Satisfactory position of right IJ dialysis catheter.  Approximately 3 left-sided rib fractures suspected, subacute to remote.   Electronically Signed   By: Britta MccreedySusan  Turner M.D.   On: 01/01/2014 13:27    Stress Test: Nuclear 1. Fixed basal to mid inferior defect suggestive of prior myocardial infarction. No significant ischemia.  2. EF calculated at 29%, visually does not appear this low. Inferior akinesis. Would suggest echo correlation.  3. Intermediate risk stress test.  Admission HPI: Felipa EthJoyce K Tonne is a 63 y.o. woman with a pmhx of CAD s/p CABG ESRD on HD, DM on lantus BID who presented to the ED yesterday with a cc of low back/ buttock pain. Lumbar spine films were negative. Patient was d/c with pain meds. This morning she continued to have sever pain. History is limited due to AMS, which occurred after 4 mg IV morphine in the ED. History is augmented in discussion with the patients husband. The patient's husband reports that she developed AMS after returinng home last night and taking the pain medicines (muscle relaxers?). This morning the patient had increased WOB with audible sounds that the husband associated with fluid overload. The husband was unable to get the patient up to go to HD due to pain. He [facility]  EMS arrived and found patient to have desat to 80's on RA. She responded nasal canula and was brought to ED. In ED troponin found to be elevated at 2.3. Cardiology and nephrology were consulted from NSTEMI and HD respectively. IMTS was consulted for admission and maintenance of chronic medical problems.   Hospital Course by problem list: Principal Problem:   NSTEMI (non-ST elevated myocardial infarction) Active Problems:   Diabetes   Acute on chronic combined systolic and diastolic heart failure, NYHA class 4   Severe mitral regurgitation   End stage kidney disease   CCF (congestive cardiac failure)   Sacrum and coccyx  fracture   Altered mental state   Chronic systolic heart failure   NSTEMI in setting of severe sCHF and severe MR  The patients NSTEMI is likely due to known CAD and volume  overload causing hypoxia induced demand ischemia. She has a complicated cardiac history with severe sCHF (EF 20% by echo in march 2015). She had CABG previously. She also has structural heart disease of severe MR and mild to moderate AR. Troponin was elevated at 2.30, remained at 2.3 after HD and downtrended. Nuclear stress revealed Fixed basal to mid inferior defect suggestive of prior myocardial infarction. No significant ischemia. EF calculated at 29%, visually does not appear this low. Inferior akinesis. Recent echo had 20-25% EF. Card rec medical management as nuclear stress was similar to expected baseline. Plan for medical management with ASA, BB (coreg 6.25 mg BID).  Dizziness-resolved. Patient was likely mildly hypovolemic after HD on 5/9. Symptoms have resolved without intervention.  Left Calf Tenderness-resolved  No swelling. Low likelihood of DVT. LE dopplers negative for DVT. D/C statin as she has had myalgias previously.   Sacral and pelvic Fractures  Patient presented 1 day after falling. Sacral and pelvic fractures identified. Pain is much more controlled on percocet q 6 hr prn , oxycodine 5 mg IR for breaktrhough pain. Consulted Ortho who rec WBAT and PT. PT rec SNF. Senokot and dulcolax for bowel regimen.Marland Kitchen   UTI  Patients UA concerning for UTI on admission. Patient given ceftriaxone for two days. Urine Culture negative. Discontinued abx.   ESRD  Patient appeared to have fluid overload that contributed to her hypoxic respiratory failure. HD per nephrology. Acute heart failure appears to have resolved with initiation of HD. Vascular surgery evaluated fistula and rec patient to follow up with home vasc sx. Duplex performed while patient inpatient.   COPD  Patient appears to have stable COPD. However, hypoxia  is likely multifactorial and COPD may be contributing.   DM  Stable on admission.  Discharge Vitals:   BP 135/61  Pulse 109  Temp(Src) 97.5 F (36.4 C) (Oral)  Resp 17  Ht 5' 5.5" (1.664 m)  Wt 127 lb 3.3 oz (57.7 kg)  BMI 20.84 kg/m2  SpO2 96%  Discharge Labs:  Results for orders placed during the hospital encounter of 01/01/14 (from the past 24 hour(s))  GLUCOSE, CAPILLARY     Status: Abnormal   Collection Time    01/06/14  4:32 PM      Result Value Ref Range   Glucose-Capillary 134 (*) 70 - 99 mg/dL   Comment 1 Notify RN    GLUCOSE, CAPILLARY     Status: Abnormal   Collection Time    01/06/14  9:40 PM      Result Value Ref Range   Glucose-Capillary 168 (*) 70 - 99 mg/dL   Comment 1 Notify RN    CBC     Status: Abnormal   Collection Time    01/07/14  4:35 AM      Result Value Ref Range   WBC 10.8 (*) 4.0 - 10.5 K/uL   RBC 4.38  3.87 - 5.11 MIL/uL   Hemoglobin 11.5 (*) 12.0 - 15.0 g/dL   HCT 08.6  76.1 - 95.0 %   MCV 88.4  78.0 - 100.0 fL   MCH 26.3  26.0 - 34.0 pg   MCHC 29.7 (*) 30.0 - 36.0 g/dL   RDW 93.2 (*) 67.1 - 24.5 %   Platelets 453 (*) 150 - 400 K/uL  GLUCOSE, CAPILLARY     Status: Abnormal   Collection Time    01/07/14  7:42 AM      Result Value Ref Range   Glucose-Capillary 167 (*) 70 - 99  mg/dL  GLUCOSE, CAPILLARY     Status: Abnormal   Collection Time    01/07/14 11:09 AM      Result Value Ref Range   Glucose-Capillary 205 (*) 70 - 99 mg/dL   Comment 1 Notify RN      Signed: Pleas Koch, MD 01/07/2014, 1:19 PM   Time Spent on Discharge: 35 minutes Services Ordered on Discharge: None Equipment Ordered on Discharge: None

## 2014-01-08 NOTE — Discharge Summary (Signed)
I have reviewed Dr. Louann Liv note and agree with documentation as outlined

## 2014-01-09 LAB — CULTURE, BLOOD (ROUTINE X 2)
Culture: NO GROWTH
Culture: NO GROWTH

## 2014-01-22 ENCOUNTER — Observation Stay (HOSPITAL_COMMUNITY)
Admission: AD | Admit: 2014-01-22 | Discharge: 2014-01-24 | Disposition: A | Discharge status: Skilled Nursing Facility | Payer: Medicare Other | Source: Other Acute Inpatient Hospital | Attending: Internal Medicine | Admitting: Internal Medicine

## 2014-01-22 DIAGNOSIS — Z9115 Patient's noncompliance with renal dialysis: Secondary | ICD-10-CM | POA: Insufficient documentation

## 2014-01-22 DIAGNOSIS — I120 Hypertensive chronic kidney disease with stage 5 chronic kidney disease or end stage renal disease: Secondary | ICD-10-CM | POA: Diagnosis not present

## 2014-01-22 DIAGNOSIS — I340 Nonrheumatic mitral (valve) insufficiency: Secondary | ICD-10-CM | POA: Diagnosis present

## 2014-01-22 DIAGNOSIS — J9601 Acute respiratory failure with hypoxia: Secondary | ICD-10-CM

## 2014-01-22 DIAGNOSIS — I2589 Other forms of chronic ischemic heart disease: Secondary | ICD-10-CM | POA: Insufficient documentation

## 2014-01-22 DIAGNOSIS — I5022 Chronic systolic (congestive) heart failure: Secondary | ICD-10-CM | POA: Diagnosis present

## 2014-01-22 DIAGNOSIS — Z7982 Long term (current) use of aspirin: Secondary | ICD-10-CM | POA: Insufficient documentation

## 2014-01-22 DIAGNOSIS — J449 Chronic obstructive pulmonary disease, unspecified: Secondary | ICD-10-CM | POA: Insufficient documentation

## 2014-01-22 DIAGNOSIS — Z91158 Patient's noncompliance with renal dialysis for other reason: Secondary | ICD-10-CM | POA: Insufficient documentation

## 2014-01-22 DIAGNOSIS — Z992 Dependence on renal dialysis: Secondary | ICD-10-CM | POA: Insufficient documentation

## 2014-01-22 DIAGNOSIS — J4489 Other specified chronic obstructive pulmonary disease: Secondary | ICD-10-CM | POA: Insufficient documentation

## 2014-01-22 DIAGNOSIS — Z87891 Personal history of nicotine dependence: Secondary | ICD-10-CM | POA: Insufficient documentation

## 2014-01-22 DIAGNOSIS — R4182 Altered mental status, unspecified: Secondary | ICD-10-CM | POA: Insufficient documentation

## 2014-01-22 DIAGNOSIS — R031 Nonspecific low blood-pressure reading: Secondary | ICD-10-CM | POA: Insufficient documentation

## 2014-01-22 DIAGNOSIS — J9600 Acute respiratory failure, unspecified whether with hypoxia or hypercapnia: Secondary | ICD-10-CM

## 2014-01-22 DIAGNOSIS — Z951 Presence of aortocoronary bypass graft: Secondary | ICD-10-CM | POA: Insufficient documentation

## 2014-01-22 DIAGNOSIS — S322XXA Fracture of coccyx, initial encounter for closed fracture: Secondary | ICD-10-CM

## 2014-01-22 DIAGNOSIS — N186 End stage renal disease: Secondary | ICD-10-CM | POA: Insufficient documentation

## 2014-01-22 DIAGNOSIS — Z794 Long term (current) use of insulin: Secondary | ICD-10-CM | POA: Insufficient documentation

## 2014-01-22 DIAGNOSIS — S3210XA Unspecified fracture of sacrum, initial encounter for closed fracture: Secondary | ICD-10-CM

## 2014-01-22 DIAGNOSIS — I953 Hypotension of hemodialysis: Secondary | ICD-10-CM | POA: Insufficient documentation

## 2014-01-22 DIAGNOSIS — E119 Type 2 diabetes mellitus without complications: Secondary | ICD-10-CM | POA: Diagnosis not present

## 2014-01-22 DIAGNOSIS — I509 Heart failure, unspecified: Secondary | ICD-10-CM | POA: Diagnosis not present

## 2014-01-22 DIAGNOSIS — I5023 Acute on chronic systolic (congestive) heart failure: Secondary | ICD-10-CM | POA: Diagnosis not present

## 2014-01-22 DIAGNOSIS — L8991 Pressure ulcer of unspecified site, stage 1: Secondary | ICD-10-CM | POA: Diagnosis not present

## 2014-01-22 DIAGNOSIS — M899 Disorder of bone, unspecified: Secondary | ICD-10-CM | POA: Diagnosis not present

## 2014-01-22 DIAGNOSIS — D638 Anemia in other chronic diseases classified elsewhere: Secondary | ICD-10-CM | POA: Insufficient documentation

## 2014-01-22 DIAGNOSIS — I059 Rheumatic mitral valve disease, unspecified: Secondary | ICD-10-CM

## 2014-01-22 DIAGNOSIS — N2581 Secondary hyperparathyroidism of renal origin: Secondary | ICD-10-CM | POA: Diagnosis not present

## 2014-01-22 DIAGNOSIS — IMO0001 Reserved for inherently not codable concepts without codable children: Secondary | ICD-10-CM | POA: Diagnosis not present

## 2014-01-22 DIAGNOSIS — I5043 Acute on chronic combined systolic (congestive) and diastolic (congestive) heart failure: Secondary | ICD-10-CM

## 2014-01-22 DIAGNOSIS — J810 Acute pulmonary edema: Secondary | ICD-10-CM | POA: Diagnosis present

## 2014-01-22 DIAGNOSIS — L89609 Pressure ulcer of unspecified heel, unspecified stage: Secondary | ICD-10-CM | POA: Insufficient documentation

## 2014-01-22 DIAGNOSIS — R0602 Shortness of breath: Secondary | ICD-10-CM | POA: Diagnosis present

## 2014-01-22 DIAGNOSIS — M949 Disorder of cartilage, unspecified: Secondary | ICD-10-CM

## 2014-01-22 DIAGNOSIS — J811 Chronic pulmonary edema: Secondary | ICD-10-CM

## 2014-01-22 HISTORY — DX: Shortness of breath: R06.02

## 2014-01-22 HISTORY — DX: Heart failure, unspecified: I50.9

## 2014-01-22 HISTORY — DX: Family history of other specified conditions: Z84.89

## 2014-01-22 LAB — GLUCOSE, CAPILLARY
GLUCOSE-CAPILLARY: 107 mg/dL — AB (ref 70–99)
Glucose-Capillary: 107 mg/dL — ABNORMAL HIGH (ref 70–99)
Glucose-Capillary: 111 mg/dL — ABNORMAL HIGH (ref 70–99)
Glucose-Capillary: 99 mg/dL (ref 70–99)
Glucose-Capillary: 97 mg/dL (ref 70–99)

## 2014-01-22 LAB — CBC
HCT: 35.9 % — ABNORMAL LOW (ref 36.0–46.0)
Hemoglobin: 10.9 g/dL — ABNORMAL LOW (ref 12.0–15.0)
MCH: 26.8 pg (ref 26.0–34.0)
MCHC: 30.4 g/dL (ref 30.0–36.0)
MCV: 88.4 fL (ref 78.0–100.0)
PLATELETS: 346 10*3/uL (ref 150–400)
RBC: 4.06 MIL/uL (ref 3.87–5.11)
RDW: 18.7 % — ABNORMAL HIGH (ref 11.5–15.5)
WBC: 10.5 10*3/uL (ref 4.0–10.5)

## 2014-01-22 LAB — TROPONIN I
Troponin I: <0.3 ng/mL (ref <0.30)
Troponin I: <0.3 ng/mL (ref <0.30)

## 2014-01-22 LAB — BASIC METABOLIC PANEL
BUN: 63 mg/dL — ABNORMAL HIGH (ref 6–23)
CALCIUM: 10.6 mg/dL — AB (ref 8.4–10.5)
CO2: 23 mEq/L (ref 19–32)
Chloride: 99 mEq/L (ref 96–112)
Creatinine, Ser: 3.75 mg/dL — ABNORMAL HIGH (ref 0.50–1.10)
GFR, EST AFRICAN AMERICAN: 14 mL/min — AB (ref >90)
GFR, EST NON AFRICAN AMERICAN: 12 mL/min — AB (ref >90)
GLUCOSE: 114 mg/dL — AB (ref 70–99)
Potassium: 4.3 mEq/L (ref 3.7–5.3)
Sodium: 138 mEq/L (ref 137–147)

## 2014-01-22 LAB — MRSA PCR SCREENING: MRSA by PCR: NEGATIVE

## 2014-01-22 LAB — HEPATITIS B SURFACE ANTIGEN: Hepatitis B Surface Ag: NEGATIVE

## 2014-01-22 MED ORDER — ALBUMIN HUMAN 25 % IV SOLN
25.0000 g | Freq: Once | INTRAVENOUS | Status: AC
  Administered 2014-01-22: 25 g via INTRAVENOUS

## 2014-01-22 MED ORDER — INSULIN ASPART 100 UNIT/ML ~~LOC~~ SOLN
0.0000 [IU] | Freq: Three times a day (TID) | SUBCUTANEOUS | Status: DC
Start: 2014-01-22 — End: 2014-01-24
  Administered 2014-01-23: 1 [IU] via SUBCUTANEOUS

## 2014-01-22 MED ORDER — BUDESONIDE-FORMOTEROL FUMARATE 160-4.5 MCG/ACT IN AERO
2.0000 | INHALATION_SPRAY | Freq: Two times a day (BID) | RESPIRATORY_TRACT | Status: DC
  Administered 2014-01-23 – 2014-01-24 (×3): 2 via RESPIRATORY_TRACT
  Filled 2014-01-22 (×3): qty 6

## 2014-01-22 MED ORDER — ASPIRIN 81 MG PO CHEW
81.0000 mg | CHEWABLE_TABLET | Freq: Every day | ORAL | Status: DC
  Administered 2014-01-22 – 2014-01-24 (×3): 81 mg via ORAL
  Filled 2014-01-22 (×4): qty 1

## 2014-01-22 MED ORDER — INSULIN GLARGINE 100 UNIT/ML ~~LOC~~ SOLN
10.0000 [IU] | Freq: Every day | SUBCUTANEOUS | Status: DC
  Administered 2014-01-23 – 2014-01-24 (×2): 10 [IU] via SUBCUTANEOUS
  Filled 2014-01-22 (×3): qty 0.1

## 2014-01-22 MED ORDER — OXYCODONE-ACETAMINOPHEN 5-325 MG PO TABS
ORAL_TABLET | ORAL | Status: AC
  Filled 2014-01-22: qty 1

## 2014-01-22 MED ORDER — SODIUM CHLORIDE 0.9 % IJ SOLN: 3.0000 mL | INTRAMUSCULAR | Status: DC | PRN

## 2014-01-22 MED ORDER — OXYCODONE HCL 5 MG PO TABS
5.0000 mg | ORAL_TABLET | Freq: Four times a day (QID) | ORAL | Status: DC | PRN
  Administered 2014-01-22: 5 mg via ORAL
  Filled 2014-01-22: qty 1

## 2014-01-22 MED ORDER — BOOST / RESOURCE BREEZE PO LIQD
1.0000 | Freq: Two times a day (BID) | ORAL | Status: DC
  Administered 2014-01-24: 1 via ORAL

## 2014-01-22 MED ORDER — CARVEDILOL 6.25 MG PO TABS
6.2500 mg | ORAL_TABLET | Freq: Two times a day (BID) | ORAL | Status: DC
  Administered 2014-01-23: 6.25 mg via ORAL
  Filled 2014-01-22 (×5): qty 1

## 2014-01-22 MED ORDER — INSULIN GLARGINE 100 UNIT/ML ~~LOC~~ SOLN
25.0000 [IU] | Freq: Two times a day (BID) | SUBCUTANEOUS | Status: DC
  Administered 2014-01-22: 25 [IU] via SUBCUTANEOUS
  Filled 2014-01-22 (×2): qty 0.25

## 2014-01-22 MED ORDER — CITALOPRAM HYDROBROMIDE 10 MG PO TABS
10.0000 mg | ORAL_TABLET | Freq: Every day | ORAL | Status: DC
  Administered 2014-01-22 – 2014-01-24 (×3): 10 mg via ORAL
  Filled 2014-01-22 (×3): qty 1

## 2014-01-22 MED ORDER — HEPARIN SODIUM (PORCINE) 5000 UNIT/ML IJ SOLN
5000.0000 [IU] | Freq: Three times a day (TID) | INTRAMUSCULAR | Status: DC
  Administered 2014-01-22 – 2014-01-24 (×5): 5000 [IU] via SUBCUTANEOUS
  Filled 2014-01-22 (×9): qty 1

## 2014-01-22 MED ORDER — DOCUSATE SODIUM 100 MG PO CAPS
100.0000 mg | ORAL_CAPSULE | Freq: Every day | ORAL | Status: DC
  Administered 2014-01-22 – 2014-01-24 (×3): 100 mg via ORAL
  Filled 2014-01-22 (×4): qty 1

## 2014-01-22 MED ORDER — IPRATROPIUM-ALBUTEROL 0.5-2.5 (3) MG/3ML IN SOLN: 3.0000 mL | Freq: Four times a day (QID) | RESPIRATORY_TRACT | Status: DC | PRN

## 2014-01-22 MED ORDER — LORAZEPAM 0.5 MG PO TABS
0.5000 mg | ORAL_TABLET | Freq: Two times a day (BID) | ORAL | Status: DC | PRN
  Administered 2014-01-23 – 2014-01-24 (×2): 0.5 mg via ORAL
  Filled 2014-01-22 (×2): qty 1

## 2014-01-22 MED ORDER — SODIUM CHLORIDE 0.9 % IJ SOLN
3.0000 mL | Freq: Two times a day (BID) | INTRAMUSCULAR | Status: DC
  Administered 2014-01-22 – 2014-01-24 (×5): 3 mL via INTRAVENOUS

## 2014-01-22 MED ORDER — INSULIN GLARGINE 100 UNIT/ML ~~LOC~~ SOLN: 10.0000 [IU] | Freq: Every day | SUBCUTANEOUS | Status: DC

## 2014-01-22 MED ORDER — NITROGLYCERIN 0.4 MG SL SUBL: 0.4000 mg | SUBLINGUAL_TABLET | SUBLINGUAL | Status: DC | PRN

## 2014-01-22 MED ORDER — SODIUM CHLORIDE 0.9 % IJ SOLN
3.0000 mL | Freq: Two times a day (BID) | INTRAMUSCULAR | Status: DC
  Administered 2014-01-23 (×2): 3 mL via INTRAVENOUS

## 2014-01-22 MED ORDER — ALBUMIN HUMAN 25 % IV SOLN
INTRAVENOUS | Status: AC
  Filled 2014-01-22: qty 100

## 2014-01-22 MED ORDER — ARIPIPRAZOLE 5 MG PO TABS
5.0000 mg | ORAL_TABLET | Freq: Every day | ORAL | Status: DC
  Administered 2014-01-22 – 2014-01-24 (×3): 5 mg via ORAL
  Filled 2014-01-22 (×4): qty 1

## 2014-01-22 MED ORDER — OXYCODONE-ACETAMINOPHEN 5-325 MG PO TABS
1.0000 | ORAL_TABLET | Freq: Four times a day (QID) | ORAL | Status: DC | PRN
  Administered 2014-01-22 – 2014-01-23 (×2): 1 via ORAL
  Filled 2014-01-22: qty 1

## 2014-01-22 MED ORDER — BISACODYL 5 MG PO TBEC: 5.0000 mg | DELAYED_RELEASE_TABLET | Freq: Every day | ORAL | Status: DC | PRN

## 2014-01-22 MED ORDER — ALBUMIN HUMAN 25 % IV SOLN
INTRAVENOUS | Status: AC
  Administered 2014-01-22: 25 g
  Filled 2014-01-22: qty 100

## 2014-01-22 MED ORDER — SODIUM CHLORIDE 0.9 % IV SOLN: 250.0000 mL | INTRAVENOUS | Status: DC | PRN

## 2014-01-22 NOTE — Care Management Note (Addendum)
    Page 1 of 1   01/24/2014     4:16:23 PM CARE MANAGEMENT NOTE 01/24/2014  Patient:  Kathleen [name], Kathleen [name]   Account Number:  [id number]  Date Initiated:  01/22/2014  Documentation initiated by:  Letha Cape  Subjective/Objective Assessment:   dx renal failure  admit- lives with spouse.     Action/Plan:   Anticipated DC Date:  01/24/2014   Anticipated DC Plan:  LONG TERM ACUTE CARE (LTAC)      DC Planning Services  CM consult      PAC Choice  LONG TERM ACUTE CARE   Choice offered to / List presented to:  C-1 Patient           Status of service:  In process, will continue to follow Medicare Important Message given?  YES (If response is "NO", the following Medicare IM given date fields will be blank) Date Medicare IM given:  01/23/2014 Date Additional Medicare IM given:    Discharge Disposition:  LONG TERM ACUTE CARE (LTAC)  Per UR Regulation:  Reviewed for med. necessity/level of care/duration of stay  If discussed at Long Length of Stay Meetings, dates discussed:    Comments:  01/24/14 1614 Letha Cape RN, BSN [phone] patient is for dc to [facility] today after HD.  01/23/14 1920 Letha Cape RN ,BSN [phone] patient  has decided to [facility] to [facility] the rep came to speak with patient and her spouse.  Patient will receive HD tomorrow and if stable will plan to go to [facility].  01/22/14 1646 Letha Cape RN, BSN [phone] CSW informed NCM that family was interested in patient going to ltac, NCM asked Boneta Lucks with [facility] to take a look at patient and [facility].  Per Boneta Lucks with [facility] , patient is not an appropriate candidate.

## 2014-01-22 NOTE — Consult Note (Signed)
I have seen and examined this patient and agree with plan as outlined by D. Zeyfang, PA-C.  Discussed case with pt and she is in too much discomfort to ride in wheelchair to outpt HD and sit in recliner for HD.  She will need Long term care facility placement that provides HD until her fracture and pain tolerance can facilitate outpt HD.  Please have Case manager initiate the process for LTC placement. Julien Nordmann Zayna Toste,MD 01/22/2014 2:53 PM

## 2014-01-22 NOTE — Clinical Social Work Psychosocial (Signed)
Clinical Social Work Department BRIEF PSYCHOSOCIAL ASSESSMENT 01/22/2014  Patient:  Kathleen [name], Kathleen [name]     Account Number:  [id number]     Admit date:  01/22/2014  Clinical Social Worker:  Lovey Newcomer  Date/Time:  01/22/2014 01:30 PM  Referred by:  Physician  Date Referred:  01/22/2014 Referred for  SNF Placement   Other Referral:   Interview type:  Family Other interview type:   Patient's family (husband and daughter) interviewed.    PSYCHOSOCIAL DATA Living Status:  HUSBAND Admitted from facility:  [facility] Level of care:  [place] Primary support name:  Kathleen [name] Primary support relationship to patient:  SPOUSE Degree of support available:   Support is good.    CURRENT CONCERNS Current Concerns  Post-Acute Placement   Other Concerns:    SOCIAL WORK ASSESSMENT / PLAN CSW met with patient's daughter and husband per MD's request. MD stated that patient's family is concerned about patient's pain during HD. Patient's family states that patient has been at [facility] for almost 100 days. Husband continued to ask CSW if patient could remain in hospital until patient was "healed" and receive her dialysis at the hospital. CSW explained to patient's husband that the patient would discharge once the doctor determined that she is medically stable. CSW explained that the family needs to be prepared to have patient taken back to SNF or will need to be prepared to take her home at discharge. Husband states that he will not be sending her back to a SNF and would take her home before doing that. Husband inquired about a LTACH setting for patient. CSW explained that RNCM can look into this for patient but the LTACH will have to determine the patient's appropriateness for LTACH. CSW strongly encouraged Kathleen [name] to prepare himself to take the patient home at discharge as this is likely what will happen. MD inquired about whether or  not it would be possible to get a stretcher for patient at HD so that she can lay down during HD instead of sitting upright which causes her a lot of pain. CSW will investigate potential options.   Assessment/plan status:  Psychosocial Support/Ongoing Assessment of Needs Other assessment/ plan:   CSW will investigate options for lessening patient's discomfort for HD and will request RNCM's assistance in determining if patient is LTACH appropriate.    update: RNCM states that patient is not appropriate for LTACH.   Information/referral to community resources:   CSW contact information given to family    PATIENT'S/FAMILY'S RESPONSE TO PLAN OF CARE: Patient's family would like to see patient remain in a hospital setting until she has recovered completely, but they understand that this is very unlikely to happen. Patient's husband Kathleen [name] states that he is prepared to take the patient home at discharge if necessary. Family was pleasant, appropriate, and appreciative of CSW contact.       Kathleen [name] MSW, [place], [place], [phone]

## 2014-01-22 NOTE — Consult Note (Signed)
[facility] Renal Consultation Note  Indication for Consultation:  Management of ESRD/hemodialysis; anemia, hypertension/volume and secondary hyperparathyroidism  HPI: Kathleen [name] is a 63 y.o. female has not been able to go to dialysis past Friday and Monday secondary to pain after recent fall last week and broke her coccyx bone(she lives in [place]).Pt was sent to [facility] ED for confusion and  transferred here for dialysis secondary to  edema on her cxr. Son in room ,he and pt.report not able to particpate in Rehab at [facility] sec.to pain and 2 weeks time is up this week in [place]. She wishes to continue HD ,but reports severe pain sitting in Recliner at op center despite current pain meds.. Denies sob, chest pain , cough , fevers, chills , tells me still making urine  using perm cath and is followed by Dr. Maryjean Morn ([facility])  With  L UAAVF that needs a procedure/?revsion has Apt. June 11 they think.      Past Medical History  Diagnosis Date  . Chronic kidney disease   . Diabetes mellitus without complication   . Hypertension   . COPD (chronic obstructive pulmonary disease)   . Ischemic cardiomyopathy   . Anemia     Past Surgical History  Procedure Laterality Date  . Knee cartilage surgery Left 2007  . Video assisted thoracoscopy (vats)/ lymph node sampling Left 2007  . Incontinence surgery  ?2006  . Coronary artery bypass graft  2010    x 1     No family history on file.    reports that she quit smoking about 5 years ago. She has never used smokeless tobacco. She reports that she does not drink alcohol or use illicit drugs.   Allergies  Allergen Reactions  . Statins     Side effects of leg pain and aching with "cholesterol medications", pt requested that no statins be given to her from this time forward  . Levaquin [Levofloxacin In D5w] Other (See Comments)    Unknown   . Metformin And Related Other (See Comments)    Unknown   . Other     Mycins  allergy   . Sulfa Antibiotics Other (See Comments)    Unknown   . Tobradex [Tobramycin-Dexamethasone] Other (See Comments)    Unknown     Prior to Admission medications   Medication Sig Start Date End Date Taking? Authorizing Provider  aspirin 81 MG tablet Take 81 mg by mouth daily.   Yes Historical Provider, MD  b complex-vitamin c-folic acid (NEPHRO-VITE) 0.8 MG TABS tablet Take 1 tablet by mouth at bedtime.   Yes Historical Provider, MD  budesonide-formoterol (SYMBICORT) 160-4.5 MCG/ACT inhaler Inhale 2 puffs into the lungs 2 (two) times daily.   Yes Historical Provider, MD  carvedilol (COREG) 6.25 MG tablet Take 1 tablet (6.25 mg total) by mouth 2 (two) times daily with a meal. 11/16/13  Yes Velvet Bathe, MD  citalopram (CELEXA) 20 MG tablet Take 20 mg by mouth daily.   Yes Historical Provider, MD  docusate sodium (COLACE) 100 MG capsule Take 100 mg by mouth 2 (two) times daily.    Yes Historical Provider, MD  glycerin adult (GLYCERIN ADULT) 2 G SUPP Place 1 suppository rectally daily as needed for moderate constipation.   Yes Historical Provider, MD  insulin glargine (LANTUS) 100 UNIT/ML injection Inject 0.25 mLs (25 Units total) into the skin 2 (two) times daily. 11/16/13  Yes Velvet Bathe, MD  LORazepam (ATIVAN) 0.5 MG tablet  Take 0.5 mg by mouth 2 (two) times daily as needed for anxiety.   Yes Historical Provider, MD  morphine (MSIR) 15 MG tablet Take 15 mg by mouth every 6 (six) hours as needed for severe pain.   Yes Historical Provider, MD  ARIPiprazole (ABILIFY) 5 MG tablet Take 5 mg by mouth daily.    Historical Provider, MD  ipratropium-albuterol (DUONEB) 0.5-2.5 (3) MG/3ML SOLN Take 3 mLs by nebulization every 6 (six) hours as needed (shortness of breath).    Historical Provider, MD  nitroGLYCERIN (NITROSTAT) 0.4 MG SL tablet Place 0.4 mg under the tongue every 5 (five) minutes as needed for chest pain.    Historical Provider, MD  omeprazole (PRILOSEC) 40 MG capsule Take 40 mg by  mouth daily.    Historical Provider, MD  senna (SENOKOT) 8.6 MG tablet Take 1 tablet by mouth daily.    Historical Provider, MD    WUJ:WJXBJY chloride, bisacodyl, ipratropium-albuterol, LORazepam, nitroGLYCERIN, oxyCODONE, oxyCODONE-acetaminophen, sodium chloride  Results for orders placed during the hospital encounter of 01/22/14 (from the past 48 hour(s))  GLUCOSE, CAPILLARY     Status: Abnormal   Collection Time    01/22/14  1:34 AM      Result Value Ref Range   Glucose-Capillary 107 (*) 70 - 99 mg/dL   Comment 1 Notify RN    MRSA PCR SCREENING     Status: None   Collection Time    01/22/14  1:45 AM      Result Value Ref Range   MRSA by PCR NEGATIVE  NEGATIVE   Comment:            The GeneXpert MRSA Assay (FDA     approved for NASAL specimens     only), is one component of a     comprehensive MRSA colonization     surveillance program. It is not     intended to diagnose MRSA     infection nor to guide or     monitor treatment for     MRSA infections.  BASIC METABOLIC PANEL     Status: Abnormal   Collection Time    01/22/14  3:15 AM      Result Value Ref Range   Sodium 138  137 - 147 mEq/L   Potassium 4.3  3.7 - 5.3 mEq/L   Chloride 99  96 - 112 mEq/L   CO2 23  19 - 32 mEq/L   Glucose, Bld 114 (*) 70 - 99 mg/dL   BUN 63 (*) 6 - 23 mg/dL   Creatinine, Ser 3.75 (*) 0.50 - 1.10 mg/dL   Calcium 10.6 (*) 8.4 - 10.5 mg/dL   GFR calc non Af Amer 12 (*) >90 mL/min   GFR calc Af Amer 14 (*) >90 mL/min   Comment: (NOTE)     The eGFR has been calculated using the CKD EPI equation.     This calculation has not been validated in all clinical situations.     eGFR's persistently <90 mL/min signify possible Chronic Kidney     Disease.  CBC     Status: Abnormal   Collection Time    01/22/14  3:15 AM      Result Value Ref Range   WBC 10.5  4.0 - 10.5 K/uL   RBC 4.06  3.87 - 5.11 MIL/uL   Hemoglobin 10.9 (*) 12.0 - 15.0 g/dL   HCT 35.9 (*) 36.0 - 46.0 %   MCV 88.4  78.0 - 100.0  fL  MCH 26.8  26.0 - 34.0 pg   MCHC 30.4  30.0 - 36.0 g/dL   RDW 18.7 (*) 11.5 - 15.5 %   Platelets 346  150 - 400 K/uL  TROPONIN I     Status: None   Collection Time    01/22/14  3:15 AM      Result Value Ref Range   Troponin I <0.30  <0.30 ng/mL   Comment:            Due to the release kinetics of cTnI,     a negative result within the first hours     of the onset of symptoms does not rule out     myocardial infarction with certainty.     If myocardial infarction is still suspected,     repeat the test at appropriate intervals.  GLUCOSE, CAPILLARY     Status: Abnormal   Collection Time    01/22/14  7:39 AM      Result Value Ref Range   Glucose-Capillary 107 (*) 70 - 99 mg/dL  TROPONIN I     Status: None   Collection Time    01/22/14  9:10 AM      Result Value Ref Range   Troponin I <0.30  <0.30 ng/mL   Comment:            Due to the release kinetics of cTnI,     a negative result within the first hours     of the onset of symptoms does not rule out     myocardial infarction with certainty.     If myocardial infarction is still suspected,     repeat the test at appropriate intervals.  GLUCOSE, CAPILLARY     Status: Abnormal   Collection Time    01/22/14 12:15 PM      Result Value Ref Range   Glucose-Capillary 111 (*) 70 - 99 mg/dL    OIB:[id number]  decr with Miralax  , other pos, in hpi.   Physical Exam: Filed Vitals:   01/22/14 0450  BP: 139/74  Pulse: 87  Temp: 97.9 F (36.6 C)  Resp: 17     General:alert WF , NAD. OX3 HEENT: , MMM Eyes: nonicteric, eomi Neck: supple Heart: RRR, 2/6 sem Mitral . Area/ lsb, no ru or gallop Lungs: Rll rales other wise CTA Abdomen: bs =, soft, nt, nd Extremities: no pedal edema Skin: R heel with dry Purplish skin area /L Heel with protective skin covering/ No overt rash Neuro: alert/ Ox3/ moves all extrem  Dialysis Access: Pos/.bruit LUAAVF. R ij perm cath  Dialysis Orders: Center: [facility]   on mwf . EDW 58.5 HD  Bath 2.0, 2.0ca  Time 4hrs Heparin 6000. Access R IJ Perm cath BFR 400 DFR a1.5    Zemplar 0 mcg IV/HD Epogen 0   Units IV/HD  Venofer 0  Assessment/Plan 1. Volume overload/ Pulmonary edema ( missed hd txs)- hd today / uf as tolerates 2. ESRD -  Normal schedule  MWF(ash) hd today and tomor sec to missed hd 3. Sacral and Pelvic Fracture- Needs better pain control as op or needs Ltac bed  Not able to use recliner per pt and son. 4. Hypertension/volume  - bp 139/74 , vol with hd/ coreg 6.85m bid  5. AMS resolved 6. Anemia  - no esa with hgb^ , fu trend 7. Metabolic bone disease -  No vit d/  No binders listed with op meds / fu ca/phos  With labs  8. 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 IDDM type 2- per admit  Ernest Haber, PA-C [place] [phone] 01/22/2014, 12:55 PM

## 2014-01-22 NOTE — Progress Notes (Addendum)
INITIAL NUTRITION ASSESSMENT  DOCUMENTATION CODES Per approved criteria  -Severe malnutrition in the context of chronic illness   INTERVENTION: Provide Resource Breeze BID Encourage PO intake  NUTRITION DIAGNOSIS: Inadequate oral intake related to poor po intake and pain AEB Kathleen [name] report and weight loss.  Goal: Kathleen [name] to meet >/= 90% of their estimated nutrition needs   Monitor:  PO intake, weight trends, labs  Reason for Assessment: COnsult/ Malnutrition Screening Tool  63 y.o. female  Admitting Dx: Acute pulmonary edema  ASSESSMENT: 63 y/o female with PPMH of CAD s/p CABG, ESRD on HD, chronic systolic HF, DM. Kathleen [name] is s/p fall last week, sustained broken coccyx bone. Resides in SNF, unable to to go HD. Has missed last 2 HD treatments. She is pleasantly confused. Work-up reveals acute pulmonary edema.  WOC RN saw Kathleen [name] this morning - Kathleen [name] with stage I pressure ulcer to R heel. Per renal team, Kathleen [name] is in too much discomfort to ride in wheelchair to outpatient HD and sit in recliner. She will need LTACH that provides HD until fracture and pain tolerance improve.  Currently ordered for Renal diet with Carbohydrate modified restrictions. Meal intake is ranging from 25-75%. Kathleen [name] unable to answer several of my questions, [facility]  Per chart - Kathleen [name]'s EDW has been lowered in the past 2 months from 69 kg to 58.5 kg. This is a weight change of 15% and is significant for this time frame. Kathleen [name] with severe muscle mass loss in her thighs.  Kathleen [name] meets criteria for severe MALNUTRITION in the context of chronic illness as evidenced by severe muscle mas loss, 15% wt loss x 2 months, and suspected intake of <75% x at least 1 month.  Potassium WNL CBG's: 107-205  Height: Ht Readings from Last 1 Encounters:  01/22/14 5\' 6"  (1.676 m)    Weight: Wt Readings from Last 1 Encounters:  01/22/14 130 lb 4.7 oz (59.1 kg)   Per renal note (5/8) - dry weight: 58.5 kg  Ideal Body Weight: 130  lbs  % Ideal Body Weight: 101%  Wt Readings from Last 10 Encounters:  01/22/14 130 lb 4.7 oz (59.1 kg)  01/07/14 127 lb 3.3 oz (57.7 kg)  11/16/13 142 lb 10.2 oz (64.7 kg)    Usual Body Weight: 64 kg  % Usual Body Weight: 92%  BMI:  Body mass index is 21.04 kg/(m^2). WNL  Estimated Nutritional Needs: Kcal: 1800-2000 Protein: 70-80 grams Fluid: 1.8 L/day  Skin:  Stage I R heel  Diet Order: Carb Control  EDUCATION NEEDS: -No education needs identified at this time   Intake/Output Summary (Last 24 hours) at 01/22/14 1010 Last data filed at 01/22/14 0645  Gross per 24 hour  Intake      3 ml  Output      0 ml  Net      3 ml    Last BM: PTA  Labs:   Recent Labs Lab 01/22/14 0315  NA 138  K 4.3  CL 99  CO2 23  BUN 63*  CREATININE 3.75*  CALCIUM 10.6*  GLUCOSE 114*    CBG (last 3)   Recent Labs  01/22/14 0134 01/22/14 0739  GLUCAP 107* 107*    Scheduled Meds: . ARIPiprazole  5 mg Oral Daily  . aspirin  81 mg Oral Daily  . budesonide-formoterol  2 puff Inhalation BID  . carvedilol  6.25 mg Oral BID WC  . citalopram  10 mg Oral Daily  .  docusate sodium  100 mg Oral Daily  . insulin aspart  0-9 Units Subcutaneous TID WC  . insulin glargine  25 Units Subcutaneous BID  . sodium chloride  3 mL Intravenous Q12H  . sodium chloride  3 mL Intravenous Q12H    Continuous Infusions:   Past Medical History  Diagnosis Date  . Chronic kidney disease   . Diabetes mellitus without complication   . Hypertension   . COPD (chronic obstructive pulmonary disease)   . Ischemic cardiomyopathy   . Anemia     Past Surgical History  Procedure Laterality Date  . Knee cartilage surgery Left 2007  . Video assisted thoracoscopy (vats)/ lymph node sampling Left 2007  . Incontinence surgery  ?2006  . Coronary artery bypass graft  2010    x 1    Jarold Motto MS, RD, LDN Inpatient Registered Dietitian Pager: [phone] After-hours pager: [phone]

## 2014-01-22 NOTE — Progress Notes (Signed)
Subjective: Patient resting comfortably on the bed. No c/o SOB. Only has minimum lower back pain. Family at bedside.  Objective: Vital signs in last 24 hours: Filed Vitals:   01/22/14 0154 01/22/14 0450  BP: 140/75 139/74  Pulse: 82 87  Temp: 98.5 F (36.9 C) 97.9 F (36.6 C)  TempSrc: Oral Oral  Resp: 16 17  Height: 5\' 6"  (1.676 m)   Weight: 130 lb 4.7 oz (59.1 kg)   SpO2: 95% 98%   Weight change:   Intake/Output Summary (Last 24 hours) at 01/22/14 1207 Last data filed at 01/22/14 0645  Gross per 24 hour  Intake      3 ml  Output      0 ml  Net      3 ml   General: NAD.  Neck: supple, full ROM, no thyromegaly, no JVD, and no carotid bruits.  Lungs: normal respiratory effort, no accessory muscle use, B/L crackles up to mid lungs. A few scattered wheezing. No rhonchi.  Heart: normal rate, regular rhythm, no murmur, no gallop, and no rub.  Abdomen: soft, non-tender, normal bowel sounds, no distention, no guarding, no rebound tenderness, no hepatomegaly, and no splenomegaly.  Msk: no joint swelling, no joint warmth, and no redness over joints.  Pulses: 1+ DP/PT pulses bilaterally Extremities: No cyanosis, clubbing, edema Neurologic: A+O x3. Intact.  Skin: turgor normal and no rashes.    Lab Results: Basic Metabolic Panel:  Recent Labs Lab 01/22/14 0315  Helayna Dun 138  K 4.3  CL 99  CO2 23  GLUCOSE 114*  BUN 63*  CREATININE 3.75*  CALCIUM 10.6*   Liver Function Tests: No results found for this basename: AST, ALT, ALKPHOS, BILITOT, PROT, ALBUMIN,  in the last 168 hours No results found for this basename: LIPASE, AMYLASE,  in the last 168 hours No results found for this basename: AMMONIA,  in the last 168 hours CBC:  Recent Labs Lab 01/22/14 0315  WBC 10.5  HGB 10.9*  HCT 35.9*  MCV 88.4  PLT 346   Cardiac Enzymes:  Recent Labs Lab 01/22/14 0315 01/22/14 0910  TROPONINI <0.30 <0.30   BNP: No results found for this basename: PROBNP,  in the last 168  hours D-Dimer: No results found for this basename: DDIMER,  in the last 168 hours CBG:  Recent Labs Lab 01/22/14 0134 01/22/14 0739  GLUCAP 107* 107*   Hemoglobin A1C: No results found for this basename: HGBA1C,  in the last 168 hours Fasting Lipid Panel: No results found for this basename: CHOL, HDL, LDLCALC, TRIG, CHOLHDL, LDLDIRECT,  in the last 168 hours Thyroid Function Tests: No results found for this basename: TSH, T4TOTAL, FREET4, T3FREE, THYROIDAB,  in the last 168 hours Coagulation: No results found for this basename: LABPROT, INR,  in the last 168 hours Anemia Panel: No results found for this basename: VITAMINB12, FOLATE, FERRITIN, TIBC, IRON, RETICCTPCT,  in the last 168 hours Urine Drug Screen: Drugs of Abuse  No results found for this basename: labopia, cocainscrnur, labbenz, amphetmu, thcu, labbarb    Alcohol Level: No results found for this basename: ETH,  in the last 168 hours Urinalysis: No results found for this basename: COLORURINE, APPERANCEUR, LABSPEC, PHURINE, GLUCOSEU, HGBUR, BILIRUBINUR, KETONESUR, PROTEINUR, UROBILINOGEN, NITRITE, LEUKOCYTESUR,  in the last 168 hours   Micro Results: Recent Results (from the past 240 hour(s))  MRSA PCR SCREENING     Status: None   Collection Time    01/22/14  1:45 AM  Result Value Ref Range Status   MRSA by PCR NEGATIVE  NEGATIVE Final   Comment:            The GeneXpert MRSA Assay (FDA     approved for NASAL specimens     only), is one component of a     comprehensive MRSA colonization     surveillance program. It is not     intended to diagnose MRSA     infection nor to guide or     monitor treatment for     MRSA infections.   Studies/Results: No results found. Medications: I have reviewed the patient's current medications. Scheduled Meds: . ARIPiprazole  5 mg Oral Daily  . aspirin  81 mg Oral Daily  . budesonide-formoterol  2 puff Inhalation BID  . carvedilol  6.25 mg Oral BID WC  . citalopram   10 mg Oral Daily  . docusate sodium  100 mg Oral Daily  . insulin aspart  0-9 Units Subcutaneous TID WC  . insulin glargine  25 Units Subcutaneous BID  . sodium chloride  3 mL Intravenous Q12H  . sodium chloride  3 mL Intravenous Q12H   Continuous Infusions:  PRN Meds:.sodium chloride, bisacodyl, ipratropium-albuterol, LORazepam, nitroGLYCERIN, oxyCODONE, oxyCODONE-acetaminophen, sodium chloride Assessment/Plan: This is a 63 year old SNF woman with a PMH of CAD s/p CABG, ICM/CHF, severe mitral regurgitation,  ESRD/HD, DM, HTN, COPD, recent fall last week with sacrum/coccyx fracture and hospitalization for NSTEMI and AC HF, who presented with SOB and confusion on admission. She has missed her last 2 sessions for dialysis due to inability to get up to go to the HD. Per admission note, she was initially sent to [facility] ED and noted to have pulmonary edema on her chest Xray.   # Acute pulmonary edema in setting of CAD/ICM/CHF and severe MR     Likely mild HF exacerbation 2/2 missing HD sessions.   - Tele - Renal consulted>>>plan HD today - Trend Troponins - EKG pending - CXR pending - continue home dose of ASA, Coreg, NTG  # Altered mental state, Completely resolved.  Patient and family states that she becomes confused whenever she takes narcotic pain meds,  - monitor  # End stage kidney disease with HD - renal consulted for HD today - SW consulted for possible HD accommodation since she has exacerbation of sacral pain from her fractures whenever she sits for long hours at HD  # DM, on home Lantus 25 BID  - Decreased Lantus to 10 Q daily based on her weight and GFR.  - SSI  # Sacral and pelvic Fractures,   - percocet q 6 hr prn , oxycodine 5 mg IR for breaktrhough pain.  - Senokot and dulcolax for bowel regimen..   # Anemia of chronic disease, baseline  # COPD, stable - continue home Nebs and inhalers  # VTE: Heparin   Dispo: Disposition is deferred at this time,  awaiting improvement of current medical problems.  Anticipated discharge in approximately 1-2 day(s).   The patient does not have a current PCP (No Pcp Per Patient) and does need an [facility] hospital follow-up appointment after discharge.  The patient does have transportation limitations that hinder transportation to clinic appointments.  .Services Needed at time of discharge: Y = Yes, Blank = No PT:   OT:   RN:   Equipment:   Other:     LOS: 0 days   Dede Query, MD 01/22/2014, 12:07 PM

## 2014-01-22 NOTE — Consult Note (Addendum)
WOC wound consult note Reason for Consult: Consult requested for right heel.  Pt states area has been present "about a week." Left heel with stage 1 pressure ulcer: 2X2cm red and non-blanchable. Right heel with suspected deep tissue ulcer; 1.8X2.3 cm dark purple with skin intact over blister.  No open wound or drainage at this time. Pressure Ulcer POA: Yes Periwound: Intact skin surrounding. Dressing procedure/placement/frequency: Foam dressing to protect from further injury.  Float heel as much as possible to reduce pressure.  Wound is high risk to evolve into full-thickness tissue loss.  Pt could benefit from home health assistance after discharge to continue to assess if wound needs a change in topical treatment after discharge. Discussed wound and plan of care with pt. She verbalized understanding. Please re-consult if further assistance is needed.  Thank-you,  Cammie Mcgee MSN, RN, CWOCN, [place], CNS [phone]

## 2014-01-22 NOTE — Progress Notes (Signed)
UR completed 

## 2014-01-22 NOTE — H&P (Signed)
Chief Complaint:  Sob, confused  HPI: 63 yo female esrd dialysis pt, htn with recent fall last week and broke her coccyx bone, has not been able to get up to go to dialysis, [place].  Has missed her last 2 sessions for dialysis.  Pt was sent to [place] ED for confusion and sob.  Pt is pleasantly confused and cannot provide any history.  She denies any pain.  She knows she recently fell and broke her tailbone.  She denies any n/v/d.  She says she has had some swelling in her legs.  She was transferred here for dialysis, with edema on her cxr.  Review of Systems:  Positive and negative as per HPI otherwise all other systems are negative but somewhat unreliable  Past Medical History: Past Medical History  Diagnosis Date  . Chronic kidney disease   . Diabetes mellitus without complication   . Hypertension   . COPD (chronic obstructive pulmonary disease)   . Ischemic cardiomyopathy   . Anemia    Past Surgical History  Procedure Laterality Date  . Knee cartilage surgery Left 2007  . Video assisted thoracoscopy (vats)/ lymph node sampling Left 2007  . Incontinence surgery  ?2006  . Coronary artery bypass graft  2010    x 1    Medications: Prior to Admission medications   Medication Sig Start Date End Date Taking? Authorizing Provider  ARIPiprazole (ABILIFY) 5 MG tablet Take 5 mg by mouth daily.    Historical Provider, MD  aspirin 81 MG tablet Take 81 mg by mouth daily.    Historical Provider, MD  bisacodyl (DULCOLAX) 5 MG EC tablet Take 1 tablet (5 mg total) by mouth daily as needed for moderate constipation. 01/07/14   Pleas Kochhristopher Komanski, MD  budesonide-formoterol [facility](SYMBICORT) 160-4.5 MCG/ACT inhaler Inhale 2 puffs into the lungs 2 (two) times daily.    Historical Provider, MD  carvedilol (COREG) 6.25 MG tablet Take 1 tablet (6.25 mg total) by mouth 2 (two) times daily with a meal. 11/16/13   Penny Piarlando Vega, MD  citalopram (CELEXA) 10 MG tablet Take 1 tablet (10 mg  total) by mouth daily. 11/16/13   Penny Piarlando Vega, MD  docusate sodium (COLACE) 100 MG capsule Take 100 mg by mouth daily.    Historical Provider, MD  insulin glargine (LANTUS) 100 UNIT/ML injection Inject 0.25 mLs (25 Units total) into the skin 2 (two) times daily. 11/16/13   Penny Piarlando Vega, MD  ipratropium-albuterol (DUONEB) 0.5-2.5 (3) MG/3ML SOLN Take 3 mLs by nebulization every 6 (six) hours as needed (shortness of breath).    Historical Provider, MD  LORazepam (ATIVAN) 0.5 MG tablet Take 0.5 mg by mouth 2 (two) times daily as needed for anxiety.    Historical Provider, MD  nitroGLYCERIN (NITROSTAT) 0.4 MG SL tablet Place 0.4 mg under the tongue every 5 (five) minutes as needed for chest pain.    Historical Provider, MD  omeprazole (PRILOSEC) 40 MG capsule Take 40 mg by mouth daily.    Historical Provider, MD  oxyCODONE (OXY IR/ROXICODONE) 5 MG immediate release tablet Take 1 tablet (5 mg total) by mouth every 6 (six) hours as needed for breakthrough pain. 01/07/14   Pleas Kochhristopher Komanski, MD  oxyCODONE-acetaminophen (PERCOCET/ROXICET) 5-325 MG per tablet Take 1 tablet by mouth every 6 (six) hours as needed for moderate pain or severe pain. 01/07/14   Pleas Kochhristopher Komanski, MD  senna (SENOKOT) 8.6 MG tablet Take 1 tablet by mouth daily.    Historical Provider, MD  Allergies:   Allergies  Allergen Reactions  . Statins     Side effects of leg pain and aching with "cholesterol medications", pt requested that no statins be given to her from this time forward  . Levaquin [Levofloxacin In D5w] Other (See Comments)    Unknown   . Metformin And Related Other (See Comments)    Unknown   . Other     Mycins allergy   . Sulfa Antibiotics Other (See Comments)    Unknown   . Tobradex [Tobramycin-Dexamethasone] Other (See Comments)    Unknown     Social History:  reports that she quit smoking about 5 years ago. She has never used smokeless tobacco. She reports that she does not drink alcohol or use  illicit drugs.  Family History: none  Physical Exam: Filed Vitals:   01/22/14 0154  BP: 140/75  Pulse: 82  Temp: 98.5 F (36.9 C)  TempSrc: Oral  Resp: 16  Height: 5\' 6"  (1.676 m)  Weight: 59.1 kg (130 lb 4.7 oz)  SpO2: 95%   General appearance: alert, cooperative and no distress Head: Normocephalic, without obvious abnormality, atraumatic Eyes: negative Nose: Nares normal. Septum midline. Mucosa normal. No drainage or sinus tenderness. Neck: no JVD and supple, symmetrical, trachea midline Lungs: clear to auscultation bilaterally Heart: regular rate and rhythm, S1, S2 normal, no murmur, click, rub or gallop Abdomen: soft, non-tender; bowel sounds normal; no masses,  no organomegaly Extremities: extremities normal, atraumatic, no cyanosis or edema Pulses: 2+ and symmetric Skin: Skin color, texture, turgor normal. No rashes or lesions Neurologic: Grossly normal  Confused     Labs on Admission:  Reviewed all records from [place]  Bun/cr 65/3.7 k 4.1 co2 26  Radiological Exams on Admission:   Assessment/Plan  63 yo female with pulmonary edema secondary to missed dialysis  Principal Problem:   Acute pulmonary edema-  Will need dialysis in am.  Nephrology notified and aware.  Her vitals are stable at this time, oxygen sats are good, no need for emergent dialysis at this time.  If pt spikes temp, would consider underlying infection, no abx at this time.  Active Problems:  Stable unless o/w noted   Anemia of chronic disease   Severe mitral regurgitation   End stage kidney disease   Altered mental state   Chronic systolic heart failure  Unclear what her baseline mental status is.  She has DNR paperwork from her snf, will honor this.  Georgi Tuel A Onalee Hua 01/22/2014, 2:08 AM

## 2014-01-22 NOTE — Progress Notes (Signed)
Pt had frequent hypotensive episodes during hemodialysis Tx and received 25Gm albumin with good results. Pt had additional hypotensive episode post hemodialysis Tx. Dr. Marisue Humble called and states to repeat albumin and may give 250cc NS if needed as well.

## 2014-01-23 ENCOUNTER — Observation Stay (HOSPITAL_COMMUNITY): Payer: Medicare Other

## 2014-01-23 ENCOUNTER — Encounter (HOSPITAL_COMMUNITY): Payer: Self-pay | Admitting: General Practice

## 2014-01-23 DIAGNOSIS — E119 Type 2 diabetes mellitus without complications: Secondary | ICD-10-CM

## 2014-01-23 DIAGNOSIS — J449 Chronic obstructive pulmonary disease, unspecified: Secondary | ICD-10-CM

## 2014-01-23 DIAGNOSIS — Z992 Dependence on renal dialysis: Secondary | ICD-10-CM

## 2014-01-23 DIAGNOSIS — X58XXXA Exposure to other specified factors, initial encounter: Secondary | ICD-10-CM

## 2014-01-23 DIAGNOSIS — S3210XA Unspecified fracture of sacrum, initial encounter for closed fracture: Secondary | ICD-10-CM

## 2014-01-23 DIAGNOSIS — I5023 Acute on chronic systolic (congestive) heart failure: Secondary | ICD-10-CM | POA: Diagnosis not present

## 2014-01-23 DIAGNOSIS — S322XXA Fracture of coccyx, initial encounter for closed fracture: Secondary | ICD-10-CM

## 2014-01-23 DIAGNOSIS — I509 Heart failure, unspecified: Secondary | ICD-10-CM

## 2014-01-23 DIAGNOSIS — S329XXA Fracture of unspecified parts of lumbosacral spine and pelvis, initial encounter for closed fracture: Secondary | ICD-10-CM

## 2014-01-23 DIAGNOSIS — I2510 Atherosclerotic heart disease of native coronary artery without angina pectoris: Secondary | ICD-10-CM

## 2014-01-23 LAB — CBC WITH DIFFERENTIAL/PLATELET
BASOS ABS: 0 10*3/uL (ref 0.0–0.1)
BASOS PCT: 0 % (ref 0–1)
EOS PCT: 7 % — AB (ref 0–5)
Eosinophils Absolute: 0.6 10*3/uL (ref 0.0–0.7)
HEMATOCRIT: 34.8 % — AB (ref 36.0–46.0)
Hemoglobin: 10.5 g/dL — ABNORMAL LOW (ref 12.0–15.0)
Lymphocytes Relative: 14 % (ref 12–46)
Lymphs Abs: 1.2 10*3/uL (ref 0.7–4.0)
MCH: 26.8 pg (ref 26.0–34.0)
MCHC: 30.2 g/dL (ref 30.0–36.0)
MCV: 88.8 fL (ref 78.0–100.0)
MONO ABS: 0.5 10*3/uL (ref 0.1–1.0)
Monocytes Relative: 6 % (ref 3–12)
NEUTROS ABS: 5.9 10*3/uL (ref 1.7–7.7)
Neutrophils Relative %: 73 % (ref 43–77)
PLATELETS: 286 10*3/uL (ref 150–400)
RBC: 3.92 MIL/uL (ref 3.87–5.11)
RDW: 18.5 % — ABNORMAL HIGH (ref 11.5–15.5)
WBC: 8.2 10*3/uL (ref 4.0–10.5)

## 2014-01-23 LAB — COMPREHENSIVE METABOLIC PANEL
ALK PHOS: 133 U/L — AB (ref 39–117)
ALT: 9 U/L (ref 0–35)
AST: 25 U/L (ref 0–37)
Albumin: 3.4 g/dL — ABNORMAL LOW (ref 3.5–5.2)
BUN: 20 mg/dL (ref 6–23)
CO2: 27 meq/L (ref 19–32)
Calcium: 10.8 mg/dL — ABNORMAL HIGH (ref 8.4–10.5)
Chloride: 97 mEq/L (ref 96–112)
Creatinine, Ser: 2.35 mg/dL — ABNORMAL HIGH (ref 0.50–1.10)
GFR calc Af Amer: 24 mL/min — ABNORMAL LOW (ref >90)
GFR calc non Af Amer: 21 mL/min — ABNORMAL LOW (ref >90)
Glucose, Bld: 113 mg/dL — ABNORMAL HIGH (ref 70–99)
POTASSIUM: 3.6 meq/L — AB (ref 3.7–5.3)
SODIUM: 138 meq/L (ref 137–147)
TOTAL PROTEIN: 8 g/dL (ref 6.0–8.3)
Total Bilirubin: 0.7 mg/dL (ref 0.3–1.2)

## 2014-01-23 LAB — GLUCOSE, CAPILLARY
GLUCOSE-CAPILLARY: 79 mg/dL (ref 70–99)
GLUCOSE-CAPILLARY: 88 mg/dL (ref 70–99)
GLUCOSE-CAPILLARY: 127 mg/dL — AB (ref 70–99)
GLUCOSE-CAPILLARY: 103 mg/dL — AB (ref 70–99)
Glucose-Capillary: 72 mg/dL (ref 70–99)

## 2014-01-23 MED ORDER — HYDROCODONE-ACETAMINOPHEN 5-325 MG PO TABS
1.0000 | ORAL_TABLET | Freq: Four times a day (QID) | ORAL | Status: DC | PRN
  Administered 2014-01-23 – 2014-01-24 (×3): 1 via ORAL
  Filled 2014-01-23 (×3): qty 1

## 2014-01-23 NOTE — Progress Notes (Signed)
Patient ID: Kathleen [name], female   DOB: August 06, 1951, 63 y.o.   MRN: [id number]  [facility] Progress Note    Subjective:   Pt very tearful this am and was afraid to go to dialysis today.  Had a lenghty discussion with pt and husband regarding her concerns as well as our concerns for her not getting adequate dialysis due to pain (having to sit in a wheelchair for hours before dialysis and then afterwards and sitting in a recliner for 4 hours)   Objective:   BP 93/65  Pulse 73  Temp(Src) 98 F (36.7 C) (Oral)  Resp 17  Ht 5\' 6"  (1.676 m)  Wt 59.3 kg (130 lb 11.7 oz)  BMI 21.11 kg/m2  SpO2 98%  Intake/Output: I/O last 3 completed shifts: In: 243 [P.O.:240; I.V.:3] Out: 2280 [Other:2280]   Intake/Output this shift:    Weight change: 2.7 kg (5 lb 15.2 oz)  Physical Exam: Gen:wd wn wf who is tearful CVS:no rub Resp:cta NWG:NFAOZHAbd:benign Ext:LUE AVF +T/B, no edema  Labs: BMET  Recent Labs Lab 01/22/14 0315  NA 138  K 4.3  CL 99  CO2 23  GLUCOSE 114*  BUN 63*  CREATININE 3.75*  CALCIUM 10.6*   CBC  Recent Labs Lab 01/22/14 0315 01/23/14 1010  WBC 10.5 8.2  NEUTROABS  --  5.9  HGB 10.9* 10.5*  HCT 35.9* 34.8*  MCV 88.4 88.8  PLT 346 286    @IMGRELPRIORS @ Medications:    . ARIPiprazole  5 mg Oral Daily  . aspirin  81 mg Oral Daily  . budesonide-formoterol  2 puff Inhalation BID  . carvedilol  6.25 mg Oral BID WC  . citalopram  10 mg Oral Daily  . docusate sodium  100 mg Oral Daily  . feeding supplement (RESOURCE BREEZE)  1 Container Oral BID BM  . heparin subcutaneous  5,000 Units Subcutaneous 3 times per day  . insulin aspart  0-9 Units Subcutaneous TID WC  . insulin glargine  10 Units Subcutaneous Daily  . sodium chloride  3 mL Intravenous Q12H  . sodium chloride  3 mL Intravenous Q12H     Dialysis Orders: Center: [facility] on mwf .  EDW 58.5 HD Bath 2.0, 2.0ca Time 4hrs Heparin 6000. Access R IJ Perm cath BFR 400 DFR a1.5  Zemplar 0 mcg  IV/HD Epogen 0 Units IV/HD Venofer 0  Assessment/Plan  1. Volume overload/ Pulmonary edema ( missed hd txs)- hd today / uf as tolerates 2. ESRD - Normal schedule MWF(ash) had hd yesterday but dropped her pressure.  Given her anxiety will hold off on HD today and resume tomorrow.  Will get back on schedule by Monday. 3. Sacral and Pelvic Fracture- Needs better pain control as op or needs Ltac bed Not able to use recliner per pt and son. 4. Hypertension/volume - bp 139/74 , vol with hd/ coreg 6.25mg  bid  5. AMS resolved 6. Anemia - no esa with hgb^ , fu trend 7. Metabolic bone disease - No vit d/ No binders listed with op meds / fu ca/phos With labs 8. 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 IDDM type 2- per admit 10. Dispo- Discussed case with pt and she is in too much discomfort to ride in wheelchair to outpt HD and sit in recliner for HD. She will need Long term care facility placement that provides HD until her fracture and pain tolerance can facilitate outpt HD. Please have Case manager initiate the process for LTC placement. 11.  Kathleen [name] A Kathleen [name] 01/23/2014, 11:07 AM

## 2014-01-23 NOTE — Progress Notes (Addendum)
Subjective:  Patient had episodes of hypotension during and after HD. She reports no pain this am. She has no SOB. She is asking if she can go home.  Objective: Vital signs in last 24 hours: Filed Vitals:   01/22/14 2230 01/22/14 2235 01/22/14 2315 01/22/14 2336  BP: 75/45 82/40 117/42 93/65  Pulse: 81 72 89 73  Temp:    98 F (36.7 C)  TempSrc:      Resp:      Height:      Weight:      SpO2:    98%   Weight change: 5 lb 15.2 oz (2.7 kg)  Intake/Output Summary (Last 24 hours) at 01/23/14 16100842 Last data filed at 01/22/14 2353  Gross per 24 hour  Intake    240 ml  Output   2280 ml  Net  -2040 ml   Physical Exam  Constitutional: She is oriented to person, place, and time. She appears well-developed and well-nourished. No distress.  Cardiovascular: Normal rate, regular rhythm, normal heart sounds and intact distal pulses.  Exam reveals no friction rub.   No murmur heard. Pulmonary/Chest: Effort normal and breath sounds normal. No respiratory distress. She has no wheezes. She has no rales.  Abdominal: Soft. Bowel sounds are normal. She exhibits no distension. There is no tenderness.  Neurological: She is alert and oriented to person, place, and time.  Skin: She is not diaphoretic.    Lab Results: Basic Metabolic Panel:  Recent Labs Lab 01/22/14 0315  NA 138  K 4.3  CL 99  CO2 23  GLUCOSE 114*  BUN 63*  CREATININE 3.75*  CALCIUM 10.6*   CBC:  Recent Labs Lab 01/22/14 0315  WBC 10.5  HGB 10.9*  HCT 35.9*  MCV 88.4  PLT 346   Cardiac Enzymes:  Recent Labs Lab 01/22/14 0315 01/22/14 0910 01/22/14 1456  TROPONINI <0.30 <0.30 <0.30   CBG:  Recent Labs Lab 01/22/14 0134 01/22/14 0739 01/22/14 1215 01/22/14 1708 01/22/14 1846 01/22/14 2335  GLUCAP 107* 107* 111* 99 97 72    Micro Results: Recent Results (from the past 240 hour(s))  MRSA PCR SCREENING     Status: None   Collection Time    01/22/14  1:45 AM      Result Value Ref Range  Status   MRSA by PCR NEGATIVE  NEGATIVE Final   Comment:            The GeneXpert MRSA Assay (FDA     approved for NASAL specimens     only), is one component of a     comprehensive MRSA colonization     surveillance program. It is not     intended to diagnose MRSA     infection nor to guide or     monitor treatment for     MRSA infections.   Studies/Results: Dg Chest 2 View  01/23/2014   CLINICAL DATA:  Shortness of breath.  EXAM: CHEST  2 VIEW  COMPARISON:  Chest radiograph performed 01/21/2014  FINDINGS: The lungs are well-aerated. Vascular congestion is noted. Scattered bilateral atelectasis is seen. There is no evidence of pleural effusion or pneumothorax.  The heart is mildly enlarged. The patient is status post median sternotomy, with evidence of prior CABG. A right IJ dual lumen catheter is seen ending within the right atrium. No acute osseous abnormalities are seen.  IMPRESSION: Vascular congestion and mild cardiomegaly noted. Scattered bilateral atelectasis seen.   Electronically Signed   By: Leotis ShamesJeffery  Chang M.D.   On: 01/23/2014 05:27   Medications: I have reviewed the patient's current medications. Scheduled Meds: . ARIPiprazole  5 mg Oral Daily  . aspirin  81 mg Oral Daily  . budesonide-formoterol  2 puff Inhalation BID  . carvedilol  6.25 mg Oral BID WC  . citalopram  10 mg Oral Daily  . docusate sodium  100 mg Oral Daily  . feeding supplement (RESOURCE BREEZE)  1 Container Oral BID BM  . heparin subcutaneous  5,000 Units Subcutaneous 3 times per day  . insulin aspart  0-9 Units Subcutaneous TID WC  . insulin glargine  10 Units Subcutaneous Daily  . sodium chloride  3 mL Intravenous Q12H  . sodium chloride  3 mL Intravenous Q12H   Continuous Infusions:  PRN Meds:.sodium chloride, bisacodyl, ipratropium-albuterol, LORazepam, nitroGLYCERIN, oxyCODONE, oxyCODONE-acetaminophen, sodium chloride Assessment/Plan: Principal Problem:   Acute pulmonary edema Active Problems:    Anemia of chronic disease   Severe mitral regurgitation   End stage kidney disease   Altered mental state   Chronic systolic heart failure   Pulmonary edema  # Acute pulmonary edema in setting of CAD/ICM/CHF and severe MR - resolved Appeared to be 2/2 non compliance with HD while at SNF. - Tele  - Renal consulted>>>plan HD today  - Trend Troponins (negative x3) - continue home dose of ASA, Coreg, NTG   # Altered mental state, Completely resolved.  Patient and family states that she becomes confused whenever she takes narcotic pain meds,  - monitor   # End stage kidney disease with HD  - renal consulted for HD today  - SW consulted for possible HD accommodation since she has exacerbation of sacral pain from her fractures whenever she sits for long hours at HD   # DM, on home Lantus 25 BID  - Decreased Lantus to 10 Q daily based on her weight and GFR.  - SSI   # Sacral and pelvic Fractures,  - percocet q 6 hr prn , oxycodine 5 mg IR for breaktrhough pain.  - Senokot and dulcolax for bowel regimen..   # Anemia of chronic disease, baseline   # COPD, stable  - continue home Nebs and inhalers   # VTE: Heparin   Dispo: Disposition is deferred at this time, awaiting improvement of current medical problems.  Anticipated discharge in approximately 1-2 day(s).   The patient does have a current PCP (No Pcp Per Patient) and does not need an [name] hospital follow-up appointment after discharge.  The patient does have transportation limitations that hinder transportation to clinic appointments.  .Services Needed at time of discharge: Y = Yes, Blank = No PT:   OT:   RN:   Equipment:   Other:     LOS: 1 day   Pleas Koch, MD 01/23/2014, 8:42 AM Attending physician note: I personally interviewed and examined this patient. Problem list, and management plan, accurate as recorded above by resident physician Dr. Pleas Koch. She is back to her baseline fluid and  electrolyte and mental status following dialysis. She did become hypotensive at the time of the procedure. She is clinically stable at this time. Fortunately, she did not fracture any weight bearing bones.  Cephas Darby, MD, FACP  Hematology-Oncology/Internal Medicine

## 2014-01-23 NOTE — Clinical Social Work Note (Signed)
Per RNCM patient is an appropriate candidate for [facility]. Since patient's husband has declined SNF placement for patient. CSW will sign off at this time. Please re-consult if necessary.    Roddie Mc MSW, [place], [place], [phone]

## 2014-01-23 NOTE — Progress Notes (Signed)
Pt b/p noted to be 80/39, notified Dr. Glendell Docker and held her 1700 coreg. Pt asymptomatic. Will continue to monitor closely. Taydem Cavagnaro D Rivka Safer

## 2014-01-24 DIAGNOSIS — R4182 Altered mental status, unspecified: Secondary | ICD-10-CM

## 2014-01-24 DIAGNOSIS — I501 Left ventricular failure: Secondary | ICD-10-CM

## 2014-01-24 DIAGNOSIS — I5023 Acute on chronic systolic (congestive) heart failure: Secondary | ICD-10-CM | POA: Diagnosis not present

## 2014-01-24 LAB — RENAL FUNCTION PANEL
Albumin: 3.2 g/dL — ABNORMAL LOW (ref 3.5–5.2)
BUN: 33 mg/dL — AB (ref 6–23)
CO2: 24 meq/L (ref 19–32)
Calcium: 11.5 mg/dL — ABNORMAL HIGH (ref 8.4–10.5)
Chloride: 96 mEq/L (ref 96–112)
Creatinine, Ser: 3.48 mg/dL — ABNORMAL HIGH (ref 0.50–1.10)
GFR calc non Af Amer: 13 mL/min — ABNORMAL LOW (ref >90)
GFR, EST AFRICAN AMERICAN: 15 mL/min — AB (ref >90)
GLUCOSE: 86 mg/dL (ref 70–99)
POTASSIUM: 3.7 meq/L (ref 3.7–5.3)
Phosphorus: 4.7 mg/dL — ABNORMAL HIGH (ref 2.3–4.6)
SODIUM: 135 meq/L — AB (ref 137–147)

## 2014-01-24 LAB — CBC
HCT: 35.9 % — ABNORMAL LOW (ref 36.0–46.0)
HEMOGLOBIN: 10.9 g/dL — AB (ref 12.0–15.0)
MCH: 26.5 pg (ref 26.0–34.0)
MCHC: 30.4 g/dL (ref 30.0–36.0)
MCV: 87.3 fL (ref 78.0–100.0)
Platelets: 320 10*3/uL (ref 150–400)
RBC: 4.11 MIL/uL (ref 3.87–5.11)
RDW: 18.5 % — ABNORMAL HIGH (ref 11.5–15.5)
WBC: 7.3 10*3/uL (ref 4.0–10.5)

## 2014-01-24 LAB — GLUCOSE, CAPILLARY
GLUCOSE-CAPILLARY: 93 mg/dL (ref 70–99)
GLUCOSE-CAPILLARY: 113 mg/dL — AB (ref 70–99)

## 2014-01-24 MED ORDER — HEPARIN SODIUM (PORCINE) 1000 UNIT/ML DIALYSIS: 20.0000 [IU]/kg | INTRAMUSCULAR | Status: DC | PRN

## 2014-01-24 MED ORDER — BOOST / RESOURCE BREEZE PO LIQD: 1.0000 | Freq: Two times a day (BID) | ORAL | Status: DC

## 2014-01-24 MED ORDER — HYDROCODONE-ACETAMINOPHEN 5-325 MG PO TABS: 1.0000 | ORAL_TABLET | Freq: Four times a day (QID) | ORAL | Status: DC | PRN

## 2014-01-24 MED ORDER — CITALOPRAM HYDROBROMIDE 10 MG PO TABS: 5.0000 mg | ORAL_TABLET | Freq: Every day | ORAL | Status: DC

## 2014-01-24 NOTE — Evaluation (Signed)
Physical Therapy Evaluation Patient Details Name: Kathleen [name] MRN: [id number] DOB: 09-Dec-1950 Today's Date: 01/24/2014   History of Present Illness  63 yo female esrd dialysis pt, htn with recent fall last week and broke her coccyx bone, has not been able to get up to go to dialysis, however she lives in [place]. PMHx- DM, ESRD on HD, HTN, COPD, cardiomyopathy, CABG, Rt hip ORIF   Clinical Impression  Pt adm due to the above. Presents with decreased independence with mobility secondary to deficits indicated below. Pt to benefit from skilled acute PT to address deficits and maximize independence with gt and mobility. Pt greatly limited with mobility due to pain. Will benefit from post acute rehab at [facility] upon acute D/C.    Follow Up Recommendations Supervision/Assistance - 24 hour;Other (comment);LTACH (family refusing SNF at this time )    Equipment Recommendations  None recommended by PT    Recommendations for Other Services       Precautions / Restrictions Precautions Precautions: Fall Restrictions Weight Bearing Restrictions: No      Mobility  Bed Mobility Overal bed mobility: Needs Assistance Bed Mobility: Supine to Sit     Supine to sit: Min assist;HOB elevated     General bed mobility comments: (A) to elevate trunk and scoot hips to sitting position; cues for hand placement and sequencing; incr time required due to pain   Transfers Overall transfer level: Needs assistance Equipment used: Rolling walker (2 wheeled) Transfers: Sit to/from Stand Sit to Stand: Mod assist;From elevated surface Stand pivot transfers: Mod assist       General transfer comment: cues for hand placement and sequencing with RW; pt with difficulty achieving upright standing position due to difficulty with WB'ing; pt keeps trunk anteriorly flexed   Ambulation/Gait             General Gait Details: pt unable to ambulate today due to pain   Stairs            Wheelchair  Mobility    Modified Rankin (Stroke Patients Only)       Balance Overall balance assessment: Needs assistance;History of Falls Sitting-balance support: Feet supported;Single extremity supported Sitting balance-Leahy Scale: Poor Sitting balance - Comments: pt leaning lt and posteriorly due to pain; unsteady Postural control: Posterior lean Standing balance support: During functional activity;Bilateral upper extremity supported Standing balance-Leahy Scale: Poor Standing balance comment: requries mod (A) and RW support                              Pertinent Vitals/Pain 8/10 with sitting; patient repositioned for comfort in chair; pillow placed in chair for comfort.    Home Living Family/patient expects to be discharged to:: Other (Comment) (LTACH )                 Additional Comments: pt recently D/C from SNF; where she was for rehab since previous admission.     Prior Function Level of Independence: Needs assistance   Gait / Transfers Assistance Needed: at SNF pt was minimally ambulatory with PT and RW  ADL's / Homemaking Assistance Needed: total (A) since most recent admission  Comments: husband present to provide PLOF information     Hand Dominance   Dominant Hand: Right    Extremity/Trunk Assessment   Upper Extremity Assessment: Defer to OT evaluation           Lower Extremity Assessment: Generalized weakness (unable to fuly assess due  to pain; could WB )      Cervical / Trunk Assessment: Normal  Communication   Communication: No difficulties  Cognition Arousal/Alertness: Awake/alert Behavior During Therapy: Anxious;Flat affect Overall Cognitive Status: Impaired/Different from baseline Area of Impairment: Orientation;Following commands;Attention;Awareness;Problem solving Orientation Level: Disoriented to;Time;Situation Current Attention Level: Focused Memory: Decreased short-term memory Following Commands: Follows one step commands  with increased time Safety/Judgement: Decreased awareness of safety Awareness: Intellectual Problem Solving: Slow processing;Decreased initiation;Requires verbal cues;Requires tactile cues General Comments: husband present; would not state if cognition was baseline for pt; pt with flat affect     General Comments      Exercises General Exercises - Lower Extremity Ankle Circles/Pumps: AROM;Strengthening;Both;10 reps;Supine      Assessment/Plan    PT Assessment Patient needs continued PT services  PT Diagnosis Difficulty walking;Acute pain   PT Problem List Decreased strength;Decreased range of motion;Decreased activity tolerance;Decreased balance;Decreased mobility;Decreased cognition;Decreased knowledge of use of DME;Decreased safety awareness;Cardiopulmonary status limiting activity;Pain  PT Treatment Interventions DME instruction;Gait training;Functional mobility training;Therapeutic exercise;Therapeutic activities;Cognitive remediation;Patient/family education;Neuromuscular re-education;Balance training   PT Goals (Current goals can be found in the Care Plan section) Acute Rehab PT Goals Patient Stated Goal: to go slower with rehab PT Goal Formulation: With patient Time For Goal Achievement: 01/31/14 Potential to Achieve Goals: Good    Frequency Min 2X/week   Barriers to discharge        Co-evaluation               End of Session Equipment Utilized During Treatment: Gait belt Activity Tolerance: Patient limited by pain Patient left: in chair;with call bell/phone within reach;with family/visitor present Nurse Communication: Mobility status;Precautions;Weight bearing status    Functional Assessment Tool Used: clinical judgement Functional Limitation: Mobility: Walking and moving around Mobility: Walking and Moving Around Current Status [phone](G8978): At least 40 percent but less than 60 percent impaired, limited or restricted Mobility: Walking and Moving Around Goal  Status [phone](G8979): At least 1 percent but less than 20 percent impaired, limited or restricted Mobility: Walking and Moving Around Discharge Status [phone](G8980): At least 40 percent but less than 60 percent impaired, limited or restricted    Time: 1115-1130 PT Time Calculation (min): 15 min   Charges:   PT Evaluation $Initial PT Evaluation Tier I: 1 Procedure PT Treatments $Therapeutic Activity: 8-22 mins   PT G Codes:   Functional Assessment Tool Used: clinical judgement Functional Limitation: Mobility: Walking and moving around    TiraBrittany N Travian Kerner, [place]  [phone] 01/24/2014, 1:43 PM

## 2014-01-24 NOTE — Progress Notes (Signed)
Patient ID: Kathleen [name], female   DOB: 07/19/1951, 63 y.o.   MRN: [id number]  [facility] Progress Note    Subjective:   More relaxed today but reports some light-headedness   Objective:   BP 118/54  Pulse 84  Temp(Src) 98.2 F (36.8 C) (Oral)  Resp 16  Ht 5\' 6"  (1.676 m)  Wt 59.3 kg (130 lb 11.7 oz)  BMI 21.11 kg/m2  SpO2 94%  Intake/Output: I/O last 3 completed shifts: In: 358 [P.O.:358] Out: 2280 [Other:2280]   Intake/Output this shift:  Total I/O In: 120 [P.O.:120] Out: -  Weight change:   Physical Exam: Gen:WD WN WF in NAD CVS:no rub Resp:cta GHW:EXHBZJ Ext:no edema, LUE AVF +T/B  Labs: BMET  Recent Labs Lab 01/22/14 0315 01/23/14 1010 01/24/14 0555  NA 138 138 135*  K 4.3 3.6* 3.7  CL 99 97 96  CO2 23 27 24   GLUCOSE 114* 113* 86  BUN 63* 20 33*  CREATININE 3.75* 2.35* 3.48*  ALBUMIN  --  3.4* 3.2*  CALCIUM 10.6* 10.8* 11.5*  PHOS  --   --  4.7*   CBC  Recent Labs Lab 01/22/14 0315 01/23/14 1010 01/24/14 0555  WBC 10.5 8.2 7.3  NEUTROABS  --  5.9  --   HGB 10.9* 10.5* 10.9*  HCT 35.9* 34.8* 35.9*  MCV 88.4 88.8 87.3  PLT 346 286 320    @IMGRELPRIORS @ Medications:    . ARIPiprazole  5 mg Oral Daily  . aspirin  81 mg Oral Daily  . budesonide-formoterol  2 puff Inhalation BID  . citalopram  10 mg Oral Daily  . docusate sodium  100 mg Oral Daily  . feeding supplement (RESOURCE BREEZE)  1 Container Oral BID BM  . heparin subcutaneous  5,000 Units Subcutaneous 3 times per day  . insulin aspart  0-9 Units Subcutaneous TID WC  . insulin glargine  10 Units Subcutaneous Daily  . sodium chloride  3 mL Intravenous Q12H  . sodium chloride  3 mL Intravenous Q12H   Dialysis Orders: Center: [facility] on mwf .  EDW 58.5 HD Bath 2.0, 2.0ca Time 4hrs Heparin 6000. Access R IJ Perm cath BFR 400 DFR a1.5  Zemplar 0 mcg IV/HD Epogen 0 Units IV/HD Venofer 0     Assessment/ Plan:    1. ESRD - Normal schedule MWF(ash) while she was  in the SNF but had been on TTS schedule.  Plan for HD today and if stable, hopeful d/c to [facility] until she is strong enough/has adequate pain relief in order to resume outpt HD and sit in a recliner for 4 hours per treatment.   2. Sacral and Pelvic Fracture- Needs better pain control and Ltac bed as she is not able to use recliner per pt and son. 3. Hypertension/volume - bp stable , vol with hd/ coreg 6.25mg  bid  4. AMS resolved 5. Anemia - no esa with hgb^ , fu trend 6. Metabolic bone disease - No vit d/ No binders listed with op meds / fu ca/phos With labs 7. 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 IDDM type 2- per admit 9. Dispo- Appreciate Case manager's assistance with LTC placement at [facility].  Hopefully will be stable for transfer.  Pt is much more at peace with the decision. 10Julien Nordmann Parnell Spieler 01/24/2014, 10:23 AM

## 2014-01-24 NOTE — Progress Notes (Signed)
Report called to Museum/gallery curator at [facility].

## 2014-01-24 NOTE — Progress Notes (Signed)
Subjective:  NAEON. Patient doing well.  Objective: Vital signs in last 24 hours: Filed Vitals:   01/23/14 2056 01/23/14 2223 01/23/14 2327 01/24/14 0413  BP: 103/49  122/62 118/54  Pulse: 66  75 84  Temp: 98 F (36.7 C)   98.2 F (36.8 C)  TempSrc: Oral   Oral  Resp:      Height:      Weight:      SpO2: 93% 95%  94%   Weight change:   Intake/Output Summary (Last 24 hours) at 01/24/14 0648 Last data filed at 01/23/14 1300  Gross per 24 hour  Intake    238 ml  Output      0 ml  Net    238 ml   Physical Exam  Constitutional: She is oriented to person, place, and time. She appears well-developed and well-nourished. No distress.  Cardiovascular: Normal rate, regular rhythm, normal heart sounds and intact distal pulses.  Exam reveals no friction rub.   No murmur heard. Pulmonary/Chest: Effort normal and breath sounds normal. No respiratory distress. She has no wheezes. She has no rales.  Abdominal: Soft. Bowel sounds are normal. She exhibits no distension. There is no tenderness.  Neurological: She is alert and oriented to person, place, and time.  Skin: She is not diaphoretic.    Lab Results: Basic Metabolic Panel:  Recent Labs Lab 01/22/14 0315 01/23/14 1010  NA 138 138  K 4.3 3.6*  CL 99 97  CO2 23 27  GLUCOSE 114* 113*  BUN 63* 20  CREATININE 3.75* 2.35*  CALCIUM 10.6* 10.8*   CBC:  Recent Labs Lab 01/22/14 0315 01/23/14 1010  WBC 10.5 8.2  NEUTROABS  --  5.9  HGB 10.9* 10.5*  HCT 35.9* 34.8*  MCV 88.4 88.8  PLT 346 286   Cardiac Enzymes:  Recent Labs Lab 01/22/14 0315 01/22/14 0910 01/22/14 1456  TROPONINI <0.30 <0.30 <0.30   CBG:  Recent Labs Lab 01/22/14 1846 01/22/14 2335 01/23/14 0904 01/23/14 1224 01/23/14 1640 01/23/14 2133  GLUCAP 97 72 79 88 127* 103*    Micro Results: Recent Results (from the past 240 hour(s))  MRSA PCR SCREENING     Status: None   Collection Time    01/22/14  1:45 AM      Result Value Ref  Range Status   MRSA by PCR NEGATIVE  NEGATIVE Final   Comment:            The GeneXpert MRSA Assay (FDA     approved for NASAL specimens     only), is one component of a     comprehensive MRSA colonization     surveillance program. It is not     intended to diagnose MRSA     infection nor to guide or     monitor treatment for     MRSA infections.   Studies/Results: Dg Chest 2 View  01/23/2014   CLINICAL DATA:  Shortness of breath.  EXAM: CHEST  2 VIEW  COMPARISON:  Chest radiograph performed 01/21/2014  FINDINGS: The lungs are well-aerated. Vascular congestion is noted. Scattered bilateral atelectasis is seen. There is no evidence of pleural effusion or pneumothorax.  The heart is mildly enlarged. The patient is status post median sternotomy, with evidence of prior CABG. A right IJ dual lumen catheter is seen ending within the right atrium. No acute osseous abnormalities are seen.  IMPRESSION: Vascular congestion and mild cardiomegaly noted. Scattered bilateral atelectasis seen.   Electronically Signed  By: Roanna RaiderJeffery  Chang M.D.   On: 01/23/2014 05:27   Medications: I have reviewed the patient's current medications. Scheduled Meds: . ARIPiprazole  5 mg Oral Daily  . aspirin  81 mg Oral Daily  . budesonide-formoterol  2 puff Inhalation BID  . citalopram  10 mg Oral Daily  . docusate sodium  100 mg Oral Daily  . feeding supplement (RESOURCE BREEZE)  1 Container Oral BID BM  . heparin subcutaneous  5,000 Units Subcutaneous 3 times per day  . insulin aspart  0-9 Units Subcutaneous TID WC  . insulin glargine  10 Units Subcutaneous Daily  . sodium chloride  3 mL Intravenous Q12H  . sodium chloride  3 mL Intravenous Q12H   Continuous Infusions:  PRN Meds:.sodium chloride, bisacodyl, HYDROcodone-acetaminophen, ipratropium-albuterol, LORazepam, nitroGLYCERIN, sodium chloride Assessment/Plan: Principal Problem:   Acute pulmonary edema Active Problems:   Anemia of chronic disease   Severe  mitral regurgitation   End stage kidney disease   Altered mental state   Chronic systolic heart failure   Pulmonary edema  Acute pulmonary edema - resolved Appeared to be 2/2 non compliance with HD while at SNF. Has no resolved. - Tele  - Renal consulted - continue home dose of ASA, Coreg, NTG   Altered mental state, Resolved.  Patient and family states that she becomes confused whenever she takes narcotic pain meds,  - Continue low dose norco  End stage kidney disease on HD  - renal consulted, appreciate recs - anticipate d/c to [facility] who can eprform her HD.  DM, on home Lantus 25 BID  - Continue Lantus to 10 Q daily based on her weight and GFR.  - SSI   Sacral and pelvic Fractures,  Stable. Needs rehab at facility - Continue norco prn - Senokot and dulcolax for bowel regimen..   Anemia of chronic disease, baseline   COPD, stable  - continue home Nebs and inhalers   VTE: Heparin  Diet: Renal  Dispo: Disposition is deferred at this time, awaiting improvement of current medical problems.  Anticipated discharge in approximately 1-2 day(s).   The patient does have a current PCP (No Pcp Per Patient) and does not [facility] after discharge.  The patient does have transportation limitations that hinder transportation to clinic appointments.  .Services Needed at time of discharge: Y = Yes, Blank = No PT:   OT:   RN:   Equipment:   Other:     LOS: 2 days   Pleas Kochhristopher Storm Sovine, MD 01/24/2014, 6:48 AM

## 2014-01-24 NOTE — Progress Notes (Signed)
DC to [facility] by [facility] at this time.

## 2014-01-24 NOTE — Discharge Summary (Signed)
Name: Kathleen [name] MRN: [id number] DOB: 06/30/1951 63 y.o. PCP: No Pcp Per Patient  Date of Admission: 01/22/2014  1:31 AM Date of Discharge: 01/24/2014 Attending Physician: Aletta EdouardShilpa Bhardwaj, MD  Discharge Diagnosis:  Principal Problem:   Acute pulmonary edema Active Problems:   Anemia of chronic disease   Severe mitral regurgitation   End stage kidney disease   Altered mental state   Chronic systolic heart failure   Pulmonary edema  Discharge Medications:   Medication List    STOP taking these medications       morphine 15 MG tablet  Commonly known as:  MSIR      TAKE these medications       ARIPiprazole 5 MG tablet  Commonly known as:  ABILIFY  Take 5 mg by mouth daily.     aspirin 81 MG tablet  Take 81 mg by mouth daily.     b complex-vitamin c-folic acid 0.8 MG Tabs tablet  Take 1 tablet by mouth at bedtime.     budesonide-formoterol 160-4.5 MCG/ACT inhaler  Commonly known as:  SYMBICORT  Inhale 2 puffs into the lungs 2 (two) times daily.     carvedilol 6.25 MG tablet  Commonly known as:  COREG  Take 1 tablet (6.25 mg total) by mouth 2 (two) times daily with a meal.     citalopram 20 MG tablet  Commonly known as:  CELEXA  Take 20 mg by mouth daily.     docusate sodium 100 MG capsule  Commonly known as:  COLACE  Take 100 mg by mouth 2 (two) times daily.     feeding supplement (RESOURCE BREEZE) Liqd  Take 1 Container by mouth 2 (two) times daily between meals.     glycerin adult 2 G Supp  Generic drug:  glycerin adult  Place 1 suppository rectally daily as needed for moderate constipation.     HYDROcodone-acetaminophen 5-325 MG per tablet  Commonly known as:  NORCO/VICODIN  Take 1 tablet by mouth every 6 (six) hours as needed for moderate pain or severe pain.     insulin glargine 100 UNIT/ML injection  Commonly known as:  LANTUS  Inject 0.25 mLs (25 Units total) into the skin 2 (two) times daily.     ipratropium-albuterol 0.5-2.5 (3)  MG/3ML Soln  Commonly known as:  DUONEB  Take 3 mLs by nebulization every 6 (six) hours as needed (shortness of breath).     LORazepam 0.5 MG tablet  Commonly known as:  ATIVAN  Take 0.5 mg by mouth 2 (two) times daily as needed for anxiety.     nitroGLYCERIN 0.4 MG SL tablet  Commonly known as:  NITROSTAT  Place 0.4 mg under the tongue every 5 (five) minutes as needed for chest pain.     omeprazole 40 MG capsule  Commonly known as:  PRILOSEC  Take 40 mg by mouth daily.     senna 8.6 MG tablet  Commonly known as:  SENOKOT  Take 1 tablet by mouth daily.        Disposition and follow-up:   Ms.Kathleen [name] [facility] St[facility].  At the hospital follow up visit please address:  1.  Pain due to sacral/pelvic fracture fracture, compliance with HD (requires bed, not recliner for HD), physical therapy after sacral/pelvic fracture  2.  Labs / imaging needed at time of follow-up: CBC, BMP  3.  Pending labs/ test needing follow-up: None  Follow-up Appointments:  Discharge Instructions:     Discharge Instructions   Call MD for:    Complete by:  As directed   New or worsening symptoms.     Diet - low sodium heart healthy    Complete by:  As directed      Discharge instructions    Complete by:  As directed   Please continue go to dialysis at the rehab center.     Increase activity slowly    Complete by:  As directed            Consultations: Treatment Team:  Irena Cords, MD  Procedures Performed:  Dg Chest 2 View  01/23/2014   CLINICAL DATA:  Shortness of breath.  EXAM: CHEST  2 VIEW  COMPARISON:  Chest radiograph performed 01/21/2014  FINDINGS: The lungs are well-aerated. Vascular congestion is noted. Scattered bilateral atelectasis is seen. There is no evidence of pleural effusion or pneumothorax.  The heart is mildly enlarged. The patient is status post median sternotomy, with evidence of prior CABG. A right IJ  dual lumen catheter is seen ending within the right atrium. No acute osseous abnormalities are seen.  IMPRESSION: Vascular congestion and mild cardiomegaly noted. Scattered bilateral atelectasis seen.   Electronically Signed   By: Roanna Raider M.D.   On: 01/23/2014 05:27   Dg Sacrum/coccyx  01/01/2014   CLINICAL DATA:  Larey Seat, injury, pain  EXAM: SACRUM AND COCCYX - 2+ VIEW  COMPARISON:  None.  FINDINGS: Difficult to exclude a mid sacral fracture, as there is slight angulation with suspected lucency through the S2 segment. Noncontrast CT scan of the pelvis recommended for further evaluation. Severe osteopenia limits evaluation. Furthermore moderate stool burden obscures much of the sacrum on the AP view. Previous ORIF for right hip fracture. Degenerative disc disease lumbar spine. Vascular calcification.  IMPRESSION: Cannot exclude mid S2 fracture. CT pelvis without contrast recommended.   Electronically Signed   By: Davonna Belling M.D.   On: 01/01/2014 14:07   Ct Pelvis Wo Contrast  01/01/2014   CLINICAL DATA:  Do a sacral fracture  EXAM: CT PELVIS WITHOUT CONTRAST  TECHNIQUE: Multidetector CT imaging of the pelvis was performed following the standard protocol without intravenous contrast.  COMPARISON:  None.  FINDINGS: There is no hip fracture or dislocation. There is orthopedic hardware in the right hip transfixing a healed intertrochanteric fracture. There is a nondisplaced fracture of the right inferior pubic ramus. There is a nondisplaced fracture at the junction of the right superior acetabulum and superior pubic ramus.  There is a fracture of the S2 vertebral body with mild focal kyphosis. The fracture extends into the right sacral ala. There is a small amount of hemorrhagic fluid anterior to the right piriformis muscle.  There is severe degenerative disc disease with broad-based disc bulge at L4-5.  There is a small fat containing umbilical hernia. There is a small amount of pelvic free fluid. There is  abdominal aortic atherosclerosis. There is a Foley catheter within the bladder.  IMPRESSION: 1. S2 vertebral body fracture with focal kyphosis centered at S2. The fracture extends into the right sacral ala. 2. Nondisplaced fracture of the right inferior pubic ramus. 3. Nondisplaced fracture at the junction of the right superior acetabulum and superior pubic ramus.   Electronically Signed   By: Elige Ko   On: 01/01/2014 23:47   Nm Myocar Multi W/spect W/wall Motion / Ef  01/02/2014   CLINICAL DATA:  Chest pain  EXAM: Lexiscan Myovue  TECHNIQUE: The patient received IV Lexiscan .4mg  over 15 seconds. 33.0 mCi of Technetium 34m Sestamibi injected at 30 seconds. Quantitative SPECT images were obtained in the vertical, horizontal and short axis planes after a 45 minute delay. Rest images were obtained with similar planes and delay using 10.2 mCi of Technetium 79m Sestamibi.  FINDINGS: ECG:  No ischemic changes with Lexiscan infusion.  Symptoms:  No chest pain  Quantitiative Gated SPECT EF: Calcuilated 29% with inferior akinesis. Suspect EF is not that depressed.  Perfusion Images: Large, severe basal to mid inferior perfusion defect seen at both rest and with Lexiscan stress.  IMPRESSION: 1. Fixed basal to mid inferior defect suggestive of prior myocardial infarction. No significant ischemia.  2. EF calculated at 29%, visually does not appear this low. Inferior akinesis. Would suggest echo correlation.  3.  Intermediate risk stress test.  Marca Ancona   Electronically Signed   By: Marca Ancona M.D.   On: 01/02/2014 17:17   Dg Chest Port 1 View  01/01/2014   CLINICAL DATA:  Shortness of breath and dialysis patient. History of CHF.  EXAM: PORTABLE CHEST - 1 VIEW  COMPARISON:  Chest radiograph 11/13/2013 and 09/04/2013  FINDINGS: Stable cardiomegaly in this patient status post median sternotomy and CABG. The superior most sternotomy wire is fractured, unchanged from chest radiograph dated 11/13/2013. There is mild  diffuse pulmonary vascular congestion. Negative for discrete pulmonary edema. Surgical suture is seen in the region of the left upper lung, laterally.  There is irregularity and probable callus formation involving 3 left-sided ribs, suggesting subacute or remote rib fractures. Negative for pneumothorax or pleural effusion.  IMPRESSION: Cardiomegaly and pulmonary vascular congestion.  Prior CABG.  Satisfactory position of right IJ dialysis catheter.  Approximately 3 left-sided rib fractures suspected, subacute to remote.   Electronically Signed   By: Britta Mccreedy M.D.   On: 01/01/2014 13:27    Admission HPI: 63 yo female [facility] dialysis pt, htn with recent fall last week and broke her coccyx bone, has not been able to get up to go to dialysis, however she lives in [place]. Has missed her last 2 sessions for dialysis. Pt was sent to [facility] ED for confusion and sob. Pt is pleasantly confused and cannot provide any history. She denies any pain. She knows she recently fell and broke her tailbone. She denies any n/v/d. She says she has had some swelling in her legs. She was transferred here for dialysis, with edema on her cxr.  Hospital Course by problem list: Principal Problem:   Acute pulmonary edema Active Problems:   Anemia of chronic disease   Severe mitral regurgitation   End stage kidney disease   Altered mental state   Chronic systolic heart failure   Pulmonary edema   Acute pulmonary edema -  Appeared to be 2/2 non compliance with HD while at SNF. Has now resolved. The patient had apparently refused HD due to pain from sacral fractures and discomfort in recliners at HD center. It will be imperative that adequate comfort is provided at the [facility] so that the patient may complete her HD.  Sacral and pelvic Fractures Stable. Needs rehab at facility. Continue Vicodin prn. Continue home bowel regimen.  Altered mental state  Patient and family states that she becomes confused whenever she takes  narcotic pain meds. Oxycodone, morphine and percocet were discontinued. The patients pain was well controlled with hydrocodone-apap 5-325 mg q 6 hr prn. Her mental status is at baseline.  End stage  kidney disease on HD  Managed by renal while inpatient. Anticipate d/c to [facility] who can perform her HD.   DM Stable on admission. Will discharge on home meds.   Discharge Vitals:   BP 118/54  Pulse 84  Temp(Src) 98.2 F (36.8 C) (Oral)  Resp 16  Ht 5\' 6"  (1.676 m)  Wt 130 lb 11.7 oz (59.3 kg)  BMI 21.11 kg/m2  SpO2 94%  Discharge Labs:  Results for orders placed during the hospital encounter of 01/22/14 (from the past 24 hour(s))  GLUCOSE, CAPILLARY     Status: None   Collection Time    01/23/14 12:24 PM      Result Value Ref Range   Glucose-Capillary 88  70 - 99 mg/dL  GLUCOSE, CAPILLARY     Status: Abnormal   Collection Time    01/23/14  4:40 PM      Result Value Ref Range   Glucose-Capillary 127 (*) 70 - 99 mg/dL  GLUCOSE, CAPILLARY     Status: Abnormal   Collection Time    01/23/14  9:33 PM      Result Value Ref Range   Glucose-Capillary 103 (*) 70 - 99 mg/dL  CBC     Status: Abnormal   Collection Time    01/24/14  5:55 AM      Result Value Ref Range   WBC 7.3  4.0 - 10.5 K/uL   RBC 4.11  3.87 - 5.11 MIL/uL   Hemoglobin 10.9 (*) 12.0 - 15.0 g/dL   HCT 16.1 (*) 09.6 - 04.5 %   MCV 87.3  78.0 - 100.0 fL   MCH 26.5  26.0 - 34.0 pg   MCHC 30.4  30.0 - 36.0 g/dL   RDW 40.9 (*) 81.1 - 91.4 %   Platelets 320  150 - 400 K/uL  RENAL FUNCTION PANEL     Status: Abnormal   Collection Time    01/24/14  5:55 AM      Result Value Ref Range   Sodium 135 (*) 137 - 147 mEq/L   Potassium 3.7  3.7 - 5.3 mEq/L   Chloride 96  96 - 112 mEq/L   CO2 24  19 - 32 mEq/L   Glucose, Bld 86  70 - 99 mg/dL   BUN 33 (*) 6 - 23 mg/dL   Creatinine, Ser 7.82 (*) 0.50 - 1.10 mg/dL   Calcium 95.6 (*) 8.4 - 10.5 mg/dL   Phosphorus 4.7 (*) 2.3 - 4.6 mg/dL   Albumin 3.2 (*) 3.5 - 5.2 g/dL   GFR  calc non Af Amer 13 (*) >90 mL/min   GFR calc Af Amer 15 (*) >90 mL/min  GLUCOSE, CAPILLARY     Status: None   Collection Time    01/24/14  7:52 AM      Result Value Ref Range   Glucose-Capillary 93  70 - 99 mg/dL    Signed: Pleas Koch, MD 01/24/2014, 10:47 AM   Time Spent on Discharge: 35 minutes Services Ordered on Discharge: None Equipment Ordered on Discharge: None Attending physician note: I personally examined this patient on the day of discharge and tested the accuracy of the findings reported above by resident physician Dr. Pleas Koch. Additional comment: The patient developed transient hypotension following dialysis which resolved spontaneously over the next 24 hours.

## 2015-03-04 ENCOUNTER — Inpatient Hospital Stay (HOSPITAL_COMMUNITY)
Admission: AD | Admit: 2015-03-04 | Discharge: 2015-03-07 | DRG: 291 | Disposition: A | Discharge status: Home / Self Care | Payer: Medicare Other | Source: Other Acute Inpatient Hospital | Attending: Internal Medicine | Admitting: Internal Medicine

## 2015-03-04 DIAGNOSIS — Z881 Allergy status to other antibiotic agents status: Secondary | ICD-10-CM | POA: Diagnosis not present

## 2015-03-04 DIAGNOSIS — G8929 Other chronic pain: Secondary | ICD-10-CM | POA: Diagnosis present

## 2015-03-04 DIAGNOSIS — J9601 Acute respiratory failure with hypoxia: Secondary | ICD-10-CM | POA: Diagnosis present

## 2015-03-04 DIAGNOSIS — F329 Major depressive disorder, single episode, unspecified: Secondary | ICD-10-CM | POA: Diagnosis present

## 2015-03-04 DIAGNOSIS — Z882 Allergy status to sulfonamides status: Secondary | ICD-10-CM

## 2015-03-04 DIAGNOSIS — I120 Hypertensive chronic kidney disease with stage 5 chronic kidney disease or end stage renal disease: Secondary | ICD-10-CM | POA: Diagnosis present

## 2015-03-04 DIAGNOSIS — D638 Anemia in other chronic diseases classified elsewhere: Secondary | ICD-10-CM | POA: Diagnosis present

## 2015-03-04 DIAGNOSIS — I255 Ischemic cardiomyopathy: Secondary | ICD-10-CM | POA: Diagnosis present

## 2015-03-04 DIAGNOSIS — Z888 Allergy status to other drugs, medicaments and biological substances status: Secondary | ICD-10-CM | POA: Diagnosis not present

## 2015-03-04 DIAGNOSIS — J449 Chronic obstructive pulmonary disease, unspecified: Secondary | ICD-10-CM | POA: Diagnosis present

## 2015-03-04 DIAGNOSIS — J811 Chronic pulmonary edema: Secondary | ICD-10-CM | POA: Diagnosis present

## 2015-03-04 DIAGNOSIS — R600 Localized edema: Secondary | ICD-10-CM | POA: Insufficient documentation

## 2015-03-04 DIAGNOSIS — I5043 Acute on chronic combined systolic (congestive) and diastolic (congestive) heart failure: Secondary | ICD-10-CM | POA: Diagnosis present

## 2015-03-04 DIAGNOSIS — R197 Diarrhea, unspecified: Secondary | ICD-10-CM | POA: Diagnosis present

## 2015-03-04 DIAGNOSIS — Z87891 Personal history of nicotine dependence: Secondary | ICD-10-CM | POA: Diagnosis not present

## 2015-03-04 DIAGNOSIS — Z7982 Long term (current) use of aspirin: Secondary | ICD-10-CM | POA: Diagnosis not present

## 2015-03-04 DIAGNOSIS — E1151 Type 2 diabetes mellitus with diabetic peripheral angiopathy without gangrene: Secondary | ICD-10-CM | POA: Diagnosis present

## 2015-03-04 DIAGNOSIS — N2581 Secondary hyperparathyroidism of renal origin: Secondary | ICD-10-CM | POA: Diagnosis present

## 2015-03-04 DIAGNOSIS — Z951 Presence of aortocoronary bypass graft: Secondary | ICD-10-CM | POA: Diagnosis not present

## 2015-03-04 DIAGNOSIS — Z794 Long term (current) use of insulin: Secondary | ICD-10-CM | POA: Diagnosis not present

## 2015-03-04 DIAGNOSIS — E1122 Type 2 diabetes mellitus with diabetic chronic kidney disease: Secondary | ICD-10-CM | POA: Diagnosis present

## 2015-03-04 DIAGNOSIS — F419 Anxiety disorder, unspecified: Secondary | ICD-10-CM | POA: Diagnosis present

## 2015-03-04 DIAGNOSIS — E119 Type 2 diabetes mellitus without complications: Secondary | ICD-10-CM

## 2015-03-04 DIAGNOSIS — N186 End stage renal disease: Secondary | ICD-10-CM | POA: Diagnosis present

## 2015-03-04 DIAGNOSIS — J810 Acute pulmonary edema: Secondary | ICD-10-CM | POA: Diagnosis not present

## 2015-03-04 DIAGNOSIS — R0602 Shortness of breath: Secondary | ICD-10-CM | POA: Insufficient documentation

## 2015-03-04 DIAGNOSIS — I5023 Acute on chronic systolic (congestive) heart failure: Secondary | ICD-10-CM

## 2015-03-04 DIAGNOSIS — Z992 Dependence on renal dialysis: Secondary | ICD-10-CM

## 2015-03-04 LAB — MRSA PCR SCREENING: MRSA by PCR: NEGATIVE

## 2015-03-04 MED ORDER — RENA-VITE PO TABS: 1.0000 | ORAL_TABLET | Freq: Every day | ORAL | Status: DC

## 2015-03-04 MED ORDER — NITROGLYCERIN 0.4 MG SL SUBL: 0.4000 mg | SUBLINGUAL_TABLET | SUBLINGUAL | Status: DC | PRN

## 2015-03-04 MED ORDER — SENNA 8.6 MG PO TABS
1.0000 | ORAL_TABLET | Freq: Every day | ORAL | Status: DC
  Administered 2015-03-05 – 2015-03-07 (×3): 8.6 mg via ORAL
  Filled 2015-03-04 (×4): qty 1

## 2015-03-04 MED ORDER — RENA-VITE PO TABS
1.0000 | ORAL_TABLET | Freq: Every day | ORAL | Status: DC
  Administered 2015-03-05 – 2015-03-06 (×2): 1 via ORAL
  Filled 2015-03-04 (×6): qty 1

## 2015-03-04 MED ORDER — OXYCODONE HCL 5 MG PO TABS
5.0000 mg | ORAL_TABLET | ORAL | Status: DC | PRN
  Administered 2015-03-07 (×2): 5 mg via ORAL
  Filled 2015-03-04 (×2): qty 1

## 2015-03-04 MED ORDER — IPRATROPIUM-ALBUTEROL 0.5-2.5 (3) MG/3ML IN SOLN
3.0000 mL | RESPIRATORY_TRACT | Status: DC | PRN
  Administered 2015-03-05 (×2): 3 mL via RESPIRATORY_TRACT
  Filled 2015-03-04 (×2): qty 3

## 2015-03-04 MED ORDER — PANTOPRAZOLE SODIUM 40 MG PO TBEC
40.0000 mg | DELAYED_RELEASE_TABLET | Freq: Every day | ORAL | Status: DC
  Administered 2015-03-05 – 2015-03-07 (×2): 40 mg via ORAL
  Filled 2015-03-04 (×2): qty 1

## 2015-03-04 MED ORDER — LORAZEPAM 0.5 MG PO TABS
0.5000 mg | ORAL_TABLET | Freq: Two times a day (BID) | ORAL | Status: DC | PRN
  Administered 2015-03-05 – 2015-03-06 (×4): 0.5 mg via ORAL
  Filled 2015-03-04 (×2): qty 1

## 2015-03-04 MED ORDER — SODIUM CHLORIDE 0.9 % IV SOLN: 250.0000 mL | INTRAVENOUS | Status: DC | PRN

## 2015-03-04 MED ORDER — ONDANSETRON HCL 4 MG PO TABS: 4.0000 mg | ORAL_TABLET | Freq: Four times a day (QID) | ORAL | Status: DC | PRN

## 2015-03-04 MED ORDER — SODIUM CHLORIDE 0.9 % IJ SOLN
3.0000 mL | Freq: Two times a day (BID) | INTRAMUSCULAR | Status: DC
  Administered 2015-03-05 – 2015-03-07 (×5): 3 mL via INTRAVENOUS

## 2015-03-04 MED ORDER — INSULIN ASPART 100 UNIT/ML ~~LOC~~ SOLN
0.0000 [IU] | SUBCUTANEOUS | Status: DC
  Administered 2015-03-05: 2 [IU] via SUBCUTANEOUS
  Administered 2015-03-05: 0 [IU] via SUBCUTANEOUS

## 2015-03-04 MED ORDER — CARVEDILOL 6.25 MG PO TABS
6.2500 mg | ORAL_TABLET | Freq: Two times a day (BID) | ORAL | Status: DC
  Administered 2015-03-05 – 2015-03-07 (×4): 6.25 mg via ORAL
  Filled 2015-03-04 (×8): qty 1

## 2015-03-04 MED ORDER — ONDANSETRON HCL 4 MG/2ML IJ SOLN: 4.0000 mg | Freq: Four times a day (QID) | INTRAMUSCULAR | Status: DC | PRN

## 2015-03-04 MED ORDER — HEPARIN SODIUM (PORCINE) 5000 UNIT/ML IJ SOLN
5000.0000 [IU] | Freq: Three times a day (TID) | INTRAMUSCULAR | Status: DC
  Administered 2015-03-05 – 2015-03-07 (×5): 5000 [IU] via SUBCUTANEOUS
  Filled 2015-03-04 (×12): qty 1

## 2015-03-04 MED ORDER — SUCROFERRIC OXYHYDROXIDE 500 MG PO CHEW
500.0000 mg | CHEWABLE_TABLET | Freq: Three times a day (TID) | ORAL | Status: DC
  Administered 2015-03-05 – 2015-03-07 (×6): 500 mg via ORAL
  Filled 2015-03-04 (×10): qty 1

## 2015-03-04 MED ORDER — ACETAMINOPHEN 325 MG PO TABS
650.0000 mg | ORAL_TABLET | Freq: Four times a day (QID) | ORAL | Status: DC | PRN
Start: 2015-03-04 — End: 2015-03-07

## 2015-03-04 MED ORDER — ASPIRIN EC 81 MG PO TBEC
81.0000 mg | DELAYED_RELEASE_TABLET | Freq: Every day | ORAL | Status: DC
  Administered 2015-03-05 – 2015-03-07 (×3): 81 mg via ORAL
  Filled 2015-03-04 (×4): qty 1

## 2015-03-04 MED ORDER — ACETAMINOPHEN 650 MG RE SUPP: 650.0000 mg | Freq: Four times a day (QID) | RECTAL | Status: DC | PRN

## 2015-03-04 MED ORDER — CALCITRIOL 0.5 MCG PO CAPS
1.0000 ug | ORAL_CAPSULE | ORAL | Status: DC
  Administered 2015-03-06: 1 ug via ORAL
  Filled 2015-03-04: qty 2

## 2015-03-04 MED ORDER — HYDROMORPHONE HCL 1 MG/ML IJ SOLN
0.5000 mg | INTRAMUSCULAR | Status: DC | PRN
  Administered 2015-03-06 (×2): 1 mg via INTRAVENOUS
  Filled 2015-03-04: qty 1

## 2015-03-04 MED ORDER — SODIUM CHLORIDE 0.9 % IJ SOLN: 3.0000 mL | INTRAMUSCULAR | Status: DC | PRN

## 2015-03-04 NOTE — Consult Note (Signed)
[name] is an 64 y.o. female referred by Dr [name]   Chief Complaint: Pul edema, anemia, sec HPTH, ESRD HPI: [name] with ESRD on HD at [name] developed SOB at HD today and unable to complete her dialysis session due to resp distress and sent to [name] where CXR showed pulm edema (per CXR report) and thus sent to [name].  She has not missed HD recently but signs off early due to chronic hip pain.  She thinks she may have over indulged in fluids over the weekend. Wt was up 4kg based on weight done at kidney center.  Labs from [name] reviewed. Initial troponin Nl.  Past Medical History  Diagnosis Date  . Chronic kidney disease   . Diabetes mellitus without complication   . Hypertension   . COPD (chronic obstructive pulmonary disease)   . Ischemic cardiomyopathy   . Anemia   . Family history of anesthesia complication     MOTHER HAD NAUSEA  . CHF (congestive heart failure)     CHRONIC SYSTOLIC  . Shortness of breath   PVD in legs, says she sees a Dr in [name] for it  Past Surgical History  Procedure Laterality Date  . Knee cartilage surgery Left 2007  . Video assisted thoracoscopy (vats)/ lymph node sampling Left 2007  . Incontinence surgery  ?2006  . Coronary artery bypass graft  2010    x 1    No family history on file. No FH of renal failure Social History:  reports that she quit smoking about 6 years ago. She has never used smokeless tobacco. She reports that she does not drink alcohol or use illicit drugs. Lives in [name] husband  Allergies:  Allergies  Allergen Reactions  . Statins     Side effects of leg pain and aching with "cholesterol medications", pt requested that no statins be given to her from this time forward  . Levaquin [Levofloxacin In D5w] Other (See Comments)    Unknown   . Metformin And Related Other (See Comments)    Unknown   . Other     Mycins allergy   . Sulfa Antibiotics Other (See Comments)    Unknown   . Tobradex  [Tobramycin-Dexamethasone] Other (See Comments)    Unknown     Medications Prior to Admission  Medication Sig Dispense Refill  . ARIPiprazole (ABILIFY) 5 MG tablet Take 5 mg by mouth daily.    Marland Kitchen aspirin 81 MG tablet Take 81 mg by mouth daily.    Marland Kitchen b complex-vitamin c-folic acid (NEPHRO-VITE) 0.8 MG TABS tablet Take 1 tablet by mouth at bedtime.    . budesonide-formoterol (SYMBICORT) 160-4.5 MCG/ACT inhaler Inhale 2 puffs into the lungs 2 (two) times daily.    . carvedilol (COREG) 6.25 MG tablet Take 1 tablet (6.25 mg total) by mouth 2 (two) times daily with a meal. 60 tablet 0  . citalopram (CELEXA) 20 MG tablet Take 20 mg by mouth daily.    Marland Kitchen docusate sodium (COLACE) 100 MG capsule Take 100 mg by mouth 2 (two) times daily.     . feeding supplement, RESOURCE BREEZE, (RESOURCE BREEZE) LIQD Take 1 Container by mouth 2 (two) times daily between meals. 60 Container 0  . glycerin adult (GLYCERIN ADULT) 2 G SUPP Place 1 suppository rectally daily as needed for moderate constipation.    Marland Kitchen HYDROcodone-acetaminophen (NORCO/VICODIN) 5-325 MG per tablet Take 1 tablet by mouth every 6 (six) hours as needed for moderate pain or  severe pain. 120 tablet 0  . insulin glargine (LANTUS) 100 UNIT/ML injection Inject 0.25 mLs (25 Units total) into the skin 2 (two) times daily. 10 mL 0  . ipratropium-albuterol (DUONEB) 0.5-2.5 (3) MG/3ML SOLN Take 3 mLs by nebulization every 6 (six) hours as needed (shortness of breath).    . LORazepam (ATIVAN) 0.5 MG tablet Take 0.5 mg by mouth 2 (two) times daily as needed for anxiety.    . nitroGLYCERIN (NITROSTAT) 0.4 MG SL tablet Place 0.4 mg under the tongue every 5 (five) minutes as needed for chest pain.    Marland Kitchen omeprazole (PRILOSEC) 40 MG capsule Take 40 mg by mouth daily.    Marland Kitchen senna (SENOKOT) 8.6 MG tablet Take 1 tablet by mouth daily.       Lab Results: UA: ND  No results for input(s): WBC, HGB, HCT, PLT in the last 72 hours. BMET No results for input(s): NA, K, CL,  CO2, GLUCOSE, BUN, CREATININE, CALCIUM, PHOS in the last 72 hours.  Invalid input(s): MAG LFT No results for input(s): PROT, ALBUMIN, AST, ALT, ALKPHOS, BILITOT, BILIDIR, IBILI in the last 72 hours. No results found.  ROS: No change in vision + SOB though better than earlier today No CP Occasional palpitation No Abd pain, + constipation Chronic Rt hip pain No dysuria   PHYSICAL EXAM: There were no vitals taken for this visit. BP 146/72, P 90  T 98.2, Gen: Mild SOB with talking HEENT: PERRLA EOMI.  [place] in place NECK:mild JVD LUNGS:decreased BS in bases R>L, bil crackles, rare scattered wheeze CARDIAC:RRR w 2-3/6 systolic M LSB ABD:+BS NTND No HSM EXT:tr edema LUA AVF + bruit.  Both feet cool to touch with mult small ulcers on toes Tr presacral edema NEURO:CNI, Ox3 No asterixis  Ash Kid:  TTS, 3.5hr, 3K,2.25Ca, BFR 400, DW 67.5, LUA AVF, calcitriol Assessment: 1. Pulm edema 2. ESRD 3. Anemia 4. Sec HPTH  Says she cannot tolerate sensipar due to diarrhea 5. PVD PLAN: 1. HD tonight to pull fluid.  Extend tx time to 4hr for this session 2. Recheck CXR in AM 3. Cont calcitriol though she says she received dose today 4. Cont velphoro as binder   [name] T 03/04/2015, 9:29 PM

## 2015-03-04 NOTE — H&P (Signed)
[facility] Admission History and Physical       K[name] ZOX:[id number] DOB: [birth year] DOA: 03/04/2015  Referring physician: EDP PCP: No PCP Per Patient  Specialists:   Chief Complaint: SOB  HPI: [facility] [name] is a 64 y.o. female with a history of ESRD on HD ( dialyzed today), Chronic systolic CHF, COPD, HTN, DM2 who was seen at the [facility] due to worsening SOB and was found to have hypoxemia and pulmonary Edema and was placed on [place] O@ and arrangements were made to transfer to [facility] for Emergent Dialysis.   She was seen on arrival by Renal and is to go have Dialysis this evening.   She is breathing comfortably at this time on 2 liters NCO2 .  She denies any chest pain.   Review of Systems:  Constitutional: No Weight Loss, No Weight Gain, Night Sweats, Fevers, Chills, Dizziness, Light Headedness, Fatigue, or Generalized Weakness HEENT: No Headaches, Difficulty Swallowing,Tooth/Dental Problems,Sore Throat,  No Sneezing, Rhinitis, Ear Ache, Nasal Congestion, or Post Nasal Drip,  Cardio-vascular:  No Chest pain, Orthopnea, PND, Edema in Lower Extremities, Anasarca, Dizziness, Palpitations  Resp: No +Dyspnea, No +DOE, No Productive Cough, No Non-Productive Cough, No Hemoptysis, No Wheezing.    GI: No Heartburn, Indigestion, Abdominal Pain, Nausea, Vomiting, Diarrhea, Constipation, Hematemesis, Hematochezia, Melena, Change in Bowel Habits,  Loss of Appetite  GU: No Dysuria, No Change in Color of Urine, No Urgency or Urinary Frequency, No Flank pain.  Musculoskeletal: No Joint Pain or Swelling, No Decreased Range of Motion, No Back Pain.  Neurologic: No Syncope, No Seizures, Muscle Weakness, Paresthesia, Vision Disturbance or Loss, No Diplopia, No Vertigo, No Difficulty Walking,  Skin: No Rash or Lesions. Psych: No Change in Mood or Affect, No Depression or Anxiety, No Memory loss, No Confusion, or Hallucinations   Past Medical History  Diagnosis Date    . Chronic kidney disease   . Diabetes mellitus without complication   . Hypertension   . COPD (chronic obstructive pulmonary disease)   . Ischemic cardiomyopathy   . Anemia   . Family history of anesthesia complication     MOTHER HAD NAUSEA  . CHF (congestive heart failure)     CHRONIC SYSTOLIC  . Shortness of breath      Past Surgical History  Procedure Laterality Date  . Knee cartilage surgery Left 2007  . Video assisted thoracoscopy (vats)/ lymph node sampling Left 2007  . Incontinence surgery  ?2006  . Coronary artery bypass graft  2010    x 1      Prior to Admission medications   Medication Sig Start Date End Date Taking? Authorizing Provider  ARIPiprazole (ABILIFY) 5 MG tablet Take 5 mg by mouth daily.    Historical Provider, MD  aspirin 81 MG tablet Take 81 mg by mouth daily.    Historical Provider, MD  b complex-vitamin c-folic acid (NEPHRO-VITE) 0.8 MG TABS tablet Take 1 tablet by mouth at bedtime.    Historical Provider, MD  budesonide-formoterol (SYMBICORT) 160-4.5 MCG/ACT inhaler Inhale 2 puffs into the lungs 2 (two) times daily.    Historical Provider, MD  carvedilol (COREG) 6.25 MG tablet Take 1 tablet (6.25 mg total) by mouth 2 (two) times daily with a meal. 11/16/13   Penny Pia, MD  citalopram (CELEXA) 20 MG tablet Take 20 mg by mouth daily.    Historical Provider, MD  docusate sodium (COLACE) 100 MG capsule Take 100 mg by mouth 2 (two) times daily.  Historical Provider, MD  feeding supplement, RESOURCE BREEZE, (RESOURCE BREEZE) LIQD Take 1 Container by mouth 2 (two) times daily between meals. 01/24/14   Bobbye Charleston, MD  glycerin adult (GLYCERIN ADULT) 2 G SUPP Place 1 suppository rectally daily as needed for moderate constipation.    Historical Provider, MD  HYDROcodone-acetaminophen (NORCO/VICODIN) 5-325 MG per tablet Take 1 tablet by mouth every 6 (six) hours as needed for moderate pain or severe pain. 01/24/14   Bobbye Charleston, MD   insulin glargine (LANTUS) 100 UNIT/ML injection Inject 0.25 mLs (25 Units total) into the skin 2 (two) times daily. 11/16/13   Penny Pia, MD  ipratropium-albuterol (DUONEB) 0.5-2.5 (3) MG/3ML SOLN Take 3 mLs by nebulization every 6 (six) hours as needed (shortness of breath).    Historical Provider, MD  LORazepam (ATIVAN) 0.5 MG tablet Take 0.5 mg by mouth 2 (two) times daily as needed for anxiety.    Historical Provider, MD  nitroGLYCERIN (NITROSTAT) 0.4 MG SL tablet Place 0.4 mg under the tongue every 5 (five) minutes as needed for chest pain.    Historical Provider, MD  omeprazole (PRILOSEC) 40 MG capsule Take 40 mg by mouth daily.    Historical Provider, MD  senna (SENOKOT) 8.6 MG tablet Take 1 tablet by mouth daily.    Historical Provider, MD     Allergies  Allergen Reactions  . Statins     Side effects of leg pain and aching with "cholesterol medications", pt requested that no statins be given to her from this time forward  . Levaquin [Levofloxacin In D5w] Other (See Comments)    Unknown   . Metformin And Related Other (See Comments)    Unknown   . Other     Mycins allergy   . Sulfa Antibiotics Other (See Comments)    Unknown   . Tobradex [Tobramycin-Dexamethasone] Other (See Comments)    Unknown     Social History:  reports that she quit smoking about 6 years ago. She has never used smokeless tobacco. She reports that she does not drink alcohol or use illicit drugs.    No family history on file.     Physical Exam:  GEN:  Pleasant Overweight  64 y.o. Caucasian female examined and in no acute distress; cooperative with exam Filed Vitals:   03/04/15 2136 03/04/15 2200  BP: 146/72   Temp: 98.2 F (36.8 C)   TempSrc: Oral   Height: 5\' 5"  (1.651 m)   Weight: 69.1 kg (152 lb 5.4 oz) 69.2 kg (152 lb 8.9 oz)   Blood pressure 146/72, temperature 98.2 F (36.8 C), temperature source Oral, height 5\' 5"  (1.651 m), weight 69.2 kg (152 lb 8.9 oz). PSYCH: She is alert and  oriented x4; does not appear anxious does not appear depressed; affect is normal HEENT: Normocephalic and Atraumatic, Mucous membranes pink; PERRLA; EOM intact; Fundi:  Benign;  No scleral icterus, Nares: Patent, Oropharynx: Clear, Fair Dentition,    Neck:  FROM, No Cervical Lymphadenopathy nor Thyromegaly or Carotid Bruit; No JVD; Breasts:: Not examined CHEST WALL: No tenderness CHEST: Normal respiration, clear to auscultation bilaterally HEART: Regular rate and rhythm; no murmurs rubs or gallops BACK: No kyphosis or scoliosis; No CVA tenderness ABDOMEN: Positive Bowel Sounds, Obese, Soft Non-Tender, No Rebound or Guarding; No Masses, No Organomegaly Rectal Exam: Not done EXTREMITIES: No Cyanosis, Clubbing, or Edema; No Ulcerations. Genitalia: not examined PULSES: 2+ and symmetric SKIN: Normal hydration no rash or ulceration CNS:  Alert and Oriented x 4, No  Focal Deficits.  Vascular: pulses palpable throughout    Labs on Admission:  Basic Metabolic Panel: No results for input(s): NA, K, CL, CO2, GLUCOSE, BUN, CREATININE, CALCIUM, MG, PHOS in the last 168 hours. Liver Function Tests: No results for input(s): AST, ALT, ALKPHOS, BILITOT, PROT, ALBUMIN in the last 168 hours. No results for input(s): LIPASE, AMYLASE in the last 168 hours. No results for input(s): AMMONIA in the last 168 hours. CBC: No results for input(s): WBC, NEUTROABS, HGB, HCT, MCV, PLT in the last 168 hours. Cardiac Enzymes: No results for input(s): CKTOTAL, CKMB, CKMBINDEX, TROPONINI in the last 168 hours.  BNP (last 3 results) No results for input(s): BNP in the last 8760 hours.  ProBNP (last 3 results) No results for input(s): PROBNP in the last 8760 hours.  CBG: No results for input(s): GLUCAP in the last 168 hours.  Radiological Exams on Admission: No results found.   EKG: Independently reviewed.    Assessment/Plan:   64 y.o. female with  Active Problems:   1.    Acute respiratory failure with  hypoxia- due to #2   Emergent Dialysis   O2 PRN   Monitor O2 sats    2.    Acute pulmonary edema/Acute on chronic combined systolic and diastolic heart failure, NYHA class 4   Emergent Dialysis   Seen By Renal on arrival     3.    ESRD on dialysis   Renal to dialyze and continue Renal Schedule     4.    Obstructive chronic bronchitis without exacerbation   Duonebs PRN     5.    Anemia of chronic disease   Monitor Trend      6.    Diabetes mellitus without complication- Diet controlled   Carb modified Renal Diet   Monitor Glucose Levels    Check HbA1C.     SSI PRN     7.    DVT Prophylaxis   SQ Heparin          Code Status:     FULL CODE     Family Communication:    No Family Present    Disposition Plan:    Inpatient Status        Time spent:  74 Minutes      Ron Parker [facility] Pager [phone]   If 7AM -7PM Please Contact the Day Rounding Team MD for [facility]  If 7PM-7AM, Please Contact Night-Floor Coverage  [email or web address] Password [email or web address] 03/04/2015, 10:46 PM     ADDENDUM:   Patient was seen and examined on 03/04/2015

## 2015-03-04 NOTE — Progress Notes (Signed)
Pt admitted to 2C11 with sob at dialysis. Pt stated that she has been ending her treatments early d/t cramping and discomfort during dialysis. Pt is alert and orientated x4. Per [facility] pt received ativan and albuterol in transport. Will monitor pt.

## 2015-03-05 ENCOUNTER — Inpatient Hospital Stay (HOSPITAL_COMMUNITY): Payer: Medicare Other

## 2015-03-05 ENCOUNTER — Encounter (HOSPITAL_COMMUNITY): Payer: Self-pay

## 2015-03-05 DIAGNOSIS — J810 Acute pulmonary edema: Secondary | ICD-10-CM

## 2015-03-05 DIAGNOSIS — I255 Ischemic cardiomyopathy: Secondary | ICD-10-CM

## 2015-03-05 DIAGNOSIS — N186 End stage renal disease: Secondary | ICD-10-CM

## 2015-03-05 DIAGNOSIS — J9601 Acute respiratory failure with hypoxia: Secondary | ICD-10-CM

## 2015-03-05 DIAGNOSIS — Z992 Dependence on renal dialysis: Secondary | ICD-10-CM

## 2015-03-05 DIAGNOSIS — R600 Localized edema: Secondary | ICD-10-CM | POA: Insufficient documentation

## 2015-03-05 LAB — GLUCOSE, CAPILLARY
GLUCOSE-CAPILLARY: 97 mg/dL (ref 65–99)
Glucose-Capillary: 125 mg/dL — ABNORMAL HIGH (ref 65–99)
Glucose-Capillary: 173 mg/dL — ABNORMAL HIGH (ref 65–99)
Glucose-Capillary: 215 mg/dL — ABNORMAL HIGH (ref 65–99)
Glucose-Capillary: 108 mg/dL — ABNORMAL HIGH (ref 65–99)

## 2015-03-05 LAB — BASIC METABOLIC PANEL
Anion gap: 11 (ref 5–15)
BUN: 10 mg/dL (ref 6–20)
CALCIUM: 9.1 mg/dL (ref 8.9–10.3)
CO2: 28 mmol/L (ref 22–32)
Chloride: 102 mmol/L (ref 101–111)
Creatinine, Ser: 1.96 mg/dL — ABNORMAL HIGH (ref 0.44–1.00)
GFR calc Af Amer: 30 mL/min — ABNORMAL LOW (ref >60)
GFR calc non Af Amer: 26 mL/min — ABNORMAL LOW (ref >60)
GLUCOSE: 128 mg/dL — AB (ref 65–99)
Potassium: 4.1 mmol/L (ref 3.5–5.1)
SODIUM: 141 mmol/L (ref 135–145)

## 2015-03-05 LAB — CBC
HCT: 33.2 % — ABNORMAL LOW (ref 36.0–46.0)
HEMOGLOBIN: 10.5 g/dL — AB (ref 12.0–15.0)
MCH: 31 pg (ref 26.0–34.0)
MCHC: 31.6 g/dL (ref 30.0–36.0)
MCV: 97.9 fL (ref 78.0–100.0)
Platelets: 218 10*3/uL (ref 150–400)
RBC: 3.39 MIL/uL — ABNORMAL LOW (ref 3.87–5.11)
RDW: 17.9 % — ABNORMAL HIGH (ref 11.5–15.5)
WBC: 8.9 10*3/uL (ref 4.0–10.5)

## 2015-03-05 MED ORDER — METHYLPREDNISOLONE SODIUM SUCC 125 MG IJ SOLR
125.0000 mg | Freq: Once | INTRAMUSCULAR | Status: AC
Start: 2015-03-05 — End: 2015-03-05
  Administered 2015-03-05: 125 mg via INTRAVENOUS
  Filled 2015-03-05: qty 2

## 2015-03-05 MED ORDER — IPRATROPIUM-ALBUTEROL 0.5-2.5 (3) MG/3ML IN SOLN
3.0000 mL | Freq: Four times a day (QID) | RESPIRATORY_TRACT | Status: DC
  Administered 2015-03-05 – 2015-03-07 (×4): 3 mL via RESPIRATORY_TRACT
  Filled 2015-03-05 (×7): qty 3

## 2015-03-05 MED ORDER — ARIPIPRAZOLE 5 MG PO TABS
5.0000 mg | ORAL_TABLET | Freq: Every day | ORAL | Status: DC
  Administered 2015-03-05 – 2015-03-07 (×3): 5 mg via ORAL
  Filled 2015-03-05 (×3): qty 1

## 2015-03-05 MED ORDER — INSULIN ASPART 100 UNIT/ML ~~LOC~~ SOLN
0.0000 [IU] | Freq: Three times a day (TID) | SUBCUTANEOUS | Status: DC
  Administered 2015-03-05: 3 [IU] via SUBCUTANEOUS
  Administered 2015-03-06: 2 [IU] via SUBCUTANEOUS
  Administered 2015-03-07: 1 [IU] via SUBCUTANEOUS

## 2015-03-05 MED ORDER — IPRATROPIUM-ALBUTEROL 0.5-2.5 (3) MG/3ML IN SOLN
RESPIRATORY_TRACT | Status: AC
  Administered 2015-03-05: 3 mL
  Filled 2015-03-05: qty 3

## 2015-03-05 MED ORDER — LORAZEPAM 0.5 MG PO TABS
ORAL_TABLET | ORAL | Status: AC
  Filled 2015-03-05: qty 1

## 2015-03-05 MED ORDER — ALBUTEROL SULFATE (2.5 MG/3ML) 0.083% IN NEBU
INHALATION_SOLUTION | RESPIRATORY_TRACT | Status: AC
  Administered 2015-03-05: 2.5 mg
  Filled 2015-03-05: qty 3

## 2015-03-05 MED ORDER — LORAZEPAM 2 MG/ML IJ SOLN
0.5000 mg | Freq: Once | INTRAMUSCULAR | Status: AC
  Administered 2015-03-05: 0.5 mg via INTRAVENOUS
  Filled 2015-03-05: qty 1

## 2015-03-05 MED FILL — Albuterol Sulfate Soln Nebu 0.5% (5 MG/ML): RESPIRATORY_TRACT | Qty: 20 | Status: AC

## 2015-03-05 MED FILL — Lorazepam Inj 2 MG/ML: INTRAMUSCULAR | Qty: 1 | Status: AC

## 2015-03-05 NOTE — Progress Notes (Signed)
  Echocardiogram 2D Echocardiogram has been performed.  Cathie Beams 03/05/2015, 3:35 PM

## 2015-03-05 NOTE — Progress Notes (Signed)
Called by Dialysis RN for pt audibly wheezing. Came up to find the pt in respiratory distress, minimal air movement. 5mg  Albuterol/0.5 Atrovent was given with significant improvement. Rapid Response RN present, Chest X-ray order. RT will continue to monitor.

## 2015-03-05 NOTE — Progress Notes (Signed)
Pt became anxious and started wheezing while in dialysis. Dialysis nurse called to update floor nurse. Up to see pt and transfer back down. Pt was breathing normal on 2L but was still anxious. Transferred pt back down to 2C after 2.5 hours of dialysis, pt settles back down in room, given night time medications along with Ativan and pt is stable sats 100% on 2L.

## 2015-03-05 NOTE — Progress Notes (Signed)
Getting hemodialysis Tx. Pt anxious during entire Tx. 0.5mg  ativan given PO with no improvement. Pt became more anxious as tx progressed, UF was turned due to severe cramping in legs and arms, cramping improved some, noticed pt wheezing and restless. Was incont of stool and began having audible expiratory wheezing. Resp therapy called to give pt a breathing Tx, resp status continued to decline while waiting arrival of RT, rapid response nurse called. RT arrived and pt was having expiratory wheezing, poor air movement, lips slightly blue, hurting in chest, anxious and was grabbing at me begging for help. Breathing Tx given, rapid response nurse at bedside. Hemodialysis Tx discontinued due to possible intubation. [name] called and informed of episode and [facility] [name] notified of resp distress. Pt completed 2.5 hrs of HD Tx with 1646cc of fluid removed. Pt transported back to 2c.

## 2015-03-05 NOTE — Progress Notes (Signed)
Called per HD RN Renee at 0130 for Pt with increased WOB and wheezing. RT at bedside providing NEB treatment. Upon my arrival 0135 Pt found resting in bed completing NEB. RR 30s, po2 100% on NEB airflow mask, BP and HR elevated. Pt anxious but appears to be calming. Once NEB completed Pt placed back on 2 LNC with RR 20s. Lungs course with much improvement after NEB treatment. CXR ordered per protocol. Nephology MD paged and updated per HD RN. [facility] DR. Jenkins paged and updated as well per HD RN. Pt to transfer to [facility]  SDU as originally ordered, BiPAP and steroids ordered per MD. Pt left in HD on 2 LNC anxiety much improved with RN completing HD takedown preparing for transfer.

## 2015-03-05 NOTE — Progress Notes (Signed)
[facility] Rounding Note Subjective:  Says still SOB but better Did not tolerate HD last PM - cramped, then became very anxious - then extremely SOB Less than a liter removed Says this is what happened at her HD unit yesterday - "felt like her chest was going to explode"   Objective Vital signs in last 24 hours: Filed Vitals:   03/05/15 0200 03/05/15 0722 03/05/15 0742 03/05/15 0800  BP: 162/82   130/72  Pulse:    96  Temp:   97.9 F (36.6 C)   TempSrc:   Oral   Resp: Height:      Weight:      SpO2: 100% 100% 100% 100%   Weight change:   Intake/Output Summary (Last 24 hours) at 03/05/15 1114 Last data filed at 03/05/15 0200  Gross per 24 hour  Intake      0 ml  Output   1696 ml  Net  -1696 ml    Physical Exam:  BP 130/72 mmHg  Pulse 96  Temp(Src) 97.9 F (36.6 C) (Oral)  Resp 14  Ht  (1.651 m)  Wt 68.8 kg (151 lb 10.8 oz)  BMI 25.24 kg/m2  SpO2 100% Audible wheezing from the foot of the bed but says not currently SOB JVP 6 cm Basilar crackles, exp wheezes S1S2 No S3 Abd soft and non-tender 1+ edema LE's Left upper arm AVF + bruit  Labs:   Recent Labs Lab 03/05/15 0310  NA 141  K 4.1  CL 102  CO2 28  GLUCOSE 128*  BUN 10  CREATININE 1.96*  CALCIUM 9.1     Recent Labs Lab 03/05/15 0310  WBC 8.9  HGB 10.5*  HCT 33.2*  MCV 97.9  PLT 218    Recent Labs Lab 03/05/15 0307 03/05/15 0742  GLUCAP 125* 173*   Studies/Results: Dg Chest Port 1 View  03/05/2015   CLINICAL DATA:  Shortness of breath.  Dialysis.  EXAM: PORTABLE CHEST - 1 VIEW  COMPARISON:  03/04/2015  FINDINGS: Stable cardiomegaly and aortic tortuosity. Re- demonstrated changes of CABG.  Diffuse interstitial coarsening is increased, with Kerley lines and trace pleural effusions. Chain sutures noted over the left upper lobe.  IMPRESSION: Pulmonary edema which has increased since yesterday.   Electronically Signed   By: Marnee Spring M.D.   On:  03/05/2015 02:25   Medications:   . ARIPiprazole  5 mg Oral Daily  . aspirin EC  81 mg Oral Daily  . [START ON 03/06/2015] calcitRIOL  1 mcg Oral Once per day on Tue Thu Sat  . carvedilol  6.25 mg Oral BID WC  . heparin  5,000 Units Subcutaneous 3 times per day  . insulin aspart  0-9 Units Subcutaneous TID WC  . ipratropium-albuterol  3 mL Nebulization Q6H  . multivitamin  1 tablet Oral QHS  . pantoprazole  40 mg Oral Daily  . senna  1 tablet Oral Daily  . sodium chloride  3 mL Intravenous Q12H  . sucroferric oxyhydroxide  500 mg Oral TID WC   [facility] Kid:  LUA AVF, calcitriol  Background 64yo WF with ESRD on HD at [facility] developed SOB at HD 7/5 and unable to complete her dialysis session due to resp distress. Sent to [facility] where CXR showed pulm edema (per CXR report) and thus sent to [facility]. Has not missed HD recently but signs off early due to chronic hip pain. Thinks she may have  over indulged in fluids over the weekend. Wt was up 4kg based on weight done at kidney center. Labs from [place] reviewed. Initial troponin Nl.  1. Pulmonary edema - less than a kg pulled last PM d/t recurrence of agitation, severe dyspnea while on HD. Will need to try to HD again today. Post weight 68.8 kg, EDW 67.5 kg. ? If there is an ischemia component here? Will check troponins. ECG if same thing occurs today while on HD. 2. ESRD usual TTS [place] 3. Anemia - need to check if ESA in [place].  4. HTN - carvedilol 5. DM2 - SSI 6. CAD, prior CABG 7. Ischemic CM - . Last ECHO here 10/2013 EF 20-25% and is being repeated 8. COPD - nebs and rec'd dose of solumedrol  Camille Bal, MD [facility] [phone] pager 03/05/2015, 11:14 AM

## 2015-03-05 NOTE — Progress Notes (Signed)
PRN Duoneb respiratory  treatment given for exp wheezes throughout lung fields. Than c/o anxiety - given Ativan 0.5 mg po. Pt was inc of urine and stool peri care done & bed change

## 2015-03-05 NOTE — Progress Notes (Signed)
PROGRESS NOTE  Kathleen [name] XLK:[id number] DOB: 1950/11/08 DOA: 03/04/2015 PCP: No PCP Per Patient  Brief history 64 year old female with history of ischemic cardiomyopathy, chronic systolic CHF with EF 20-25%, COPD, hypertension, ESRD, DM2 presented to [facility] emergency department with hypoxemia and shortness of breath. The patient was unable to complete dialysis secondary to shortness of breath at her outpatient dialysis center. Apparently, the patient has been signing out early off dialysis without getting full treatments secondary to chronic hip pain. The patient has been only staying on dialysis for 2 hours. She was transferred to [facility] 03/04/2015 because of her need for emergent dialysis. The patient was seen by nephrology, and patient underwent dialysis on the evening of 03/04/2015 with 2.5 L removed.  Assessment/Plan: Acute respiratory failure -Secondary to pulmonary edema in the setting of underlying COPD -Patient has been signing off early off of dialysis in her outpatient Center -Presently stable on 2 L nasal cannula -Wean oxygen for oxygen saturations greater than 92% Pulmonary edema/acute on chronic systolic CHF -Secondary to taking self off HD early -03/04/2015--2.5 hours dialysis -Repeat echocardiogram -Continue carvedilol -Appreciate nephrology -Suspect patient will need another dialysis session today ESRD -Appreciate nephrology follow-up -Dialysis on Tuesday, Thursday, Saturday COPD -Patient has 40-pack-year history -Quit smoking 2010 -Continue aerosolized albuterol and Atrovent -Patient was given a one-time dose of Solu-Medrol  IV 03/05/15  -Hold additional sterile for now and monitor the patient clinically Diabetes mellitus, type 2 -Previously taking Lantus 25 units twice a day -However, patient states that she has been off of insulin for 6 months -Recheck hemoglobin A1c -NovoLog sliding scale Hypertension -Continue carvedilol -Expect  improvement in blood pressure with continued dialysis Depression/anxiety -Continue Abilify -Continue Ativan when necessary Leg edema -duplex LE r/o DVT    Family Communication:   Pt at beside Disposition Plan:   Home 1-2 days       Procedures/Studies: Dg Chest Port 1 View  03/05/2015   CLINICAL DATA:  Shortness of breath.  Dialysis.  EXAM: PORTABLE CHEST - 1 VIEW  COMPARISON:  03/04/2015  FINDINGS: Stable cardiomegaly and aortic tortuosity. Re- demonstrated changes of CABG.  Diffuse interstitial coarsening is increased, with Kerley lines and trace pleural effusions. Chain sutures noted over the left upper lobe.  IMPRESSION: Pulmonary edema which has increased since yesterday.   Electronically Signed   By: Marnee Spring M.D.   On: 03/05/2015 02:25        Subjective:  Patient states that she is breathing a little better this morning. Denies any fevers, chills, chest discomfort, nausea, vomiting, diarrhea, abdominal pain.  Objective: Filed Vitals:   03/05/15 0138 03/05/15 0200 03/05/15 0722 03/05/15 0742  BP: 186/90 162/82    Pulse: 122     Temp: 98.1 F (36.7 C)   97.9 F (36.6 C)  TempSrc: Oral   Oral  Resp: 44 20  16  Height:      Weight: 68.8 kg (151 lb 10.8 oz)     SpO2: 98% 100% 100% 100%    Intake/Output Summary (Last 24 hours) at 03/05/15 0837 Last data filed at 03/05/15 0200  Gross per 24 hour  Intake      0 ml  Output   1696 ml  Net  -1696 ml   Weight change:  Exam:   General:  Pt is alert, follows commands appropriately, not in acute distress  HEENT: No icterus, No thrush, [employer]/AT; no neck mass  Cardiovascular: RRR, S1/S2, no  rubs, no gallops  Respiratory: bilateral crackles with bibasilar wheeze. Good air movement.   Abdomen: Soft/+BS, non tender, non distended, no guarding no hepatosplenomegaly;   Extremities: 1+LE edema, No lymphangitis, No petechiae, No rashes; no cyanosis or clubbing; no  Synovitis; palpable thrill left upper extremity    Data Reviewed: Basic Metabolic Panel:  Recent Labs Lab 03/05/15 0310  NA 141  K 4.1  CL 102  CO2 28  GLUCOSE 128*  BUN 10  CREATININE 1.96*  CALCIUM 9.1   Liver Function Tests: No results for input(s): AST, ALT, ALKPHOS, BILITOT, PROT, ALBUMIN in the last 168 hours. No results for input(s): LIPASE, AMYLASE in the last 168 hours. No results for input(s): AMMONIA in the last 168 hours. CBC:  Recent Labs Lab 03/05/15 0310  WBC 8.9  HGB 10.5*  HCT 33.2*  MCV 97.9  PLT 218   Cardiac Enzymes: No results for input(s): CKTOTAL, CKMB, CKMBINDEX, TROPONINI in the last 168 hours. BNP: Invalid input(s): POCBNP CBG:  Recent Labs Lab 03/05/15 0307 03/05/15 0742  GLUCAP 125* 173*    Recent Results (from the past 240 hour(s))  MRSA PCR Screening     Status: None   Collection Time: 03/04/15  9:35 PM  Result Value Ref Range Status   MRSA by PCR NEGATIVE NEGATIVE Final    Comment:        The GeneXpert MRSA Assay (FDA approved for NASAL specimens only), is one component of a comprehensive MRSA colonization surveillance program. It is not intended to diagnose MRSA infection nor to guide or monitor treatment for MRSA infections.      Scheduled Meds: . ARIPiprazole  5 mg Oral Daily  . aspirin EC  81 mg Oral Daily  . [START ON 03/06/2015] calcitRIOL  1 mcg Oral Once per day on Tue Thu Sat  . carvedilol  6.25 mg Oral BID WC  . heparin  5,000 Units Subcutaneous 3 times per day  . insulin aspart  0-9 Units Subcutaneous TID WC  . ipratropium-albuterol  3 mL Nebulization Q6H  . multivitamin  1 tablet Oral QHS  . pantoprazole  40 mg Oral Daily  . senna  1 tablet Oral Daily  . sodium chloride  3 mL Intravenous Q12H  . sucroferric oxyhydroxide  500 mg Oral TID WC   Continuous Infusions:    Iris Tatsch, DO  [facility] Pager [phone]  If 7PM-7AM, please contact night-coverage [email or web address] Password [email or web address] 03/05/2015, 8:37 AM   LOS: 1 day

## 2015-03-06 ENCOUNTER — Inpatient Hospital Stay (HOSPITAL_COMMUNITY): Payer: Medicare Other

## 2015-03-06 DIAGNOSIS — I5023 Acute on chronic systolic (congestive) heart failure: Secondary | ICD-10-CM

## 2015-03-06 DIAGNOSIS — R600 Localized edema: Secondary | ICD-10-CM

## 2015-03-06 LAB — GLUCOSE, CAPILLARY
GLUCOSE-CAPILLARY: 94 mg/dL (ref 65–99)
Glucose-Capillary: 131 mg/dL — ABNORMAL HIGH (ref 65–99)
Glucose-Capillary: 162 mg/dL — ABNORMAL HIGH (ref 65–99)
Glucose-Capillary: 97 mg/dL (ref 65–99)
Glucose-Capillary: 97 mg/dL (ref 65–99)

## 2015-03-06 LAB — RENAL FUNCTION PANEL
ALBUMIN: 2.8 g/dL — AB (ref 3.5–5.0)
Anion gap: 8 (ref 5–15)
BUN: 18 mg/dL (ref 6–20)
CHLORIDE: 100 mmol/L — AB (ref 101–111)
CO2: 29 mmol/L (ref 22–32)
Calcium: 8.7 mg/dL — ABNORMAL LOW (ref 8.9–10.3)
Creatinine, Ser: 2.78 mg/dL — ABNORMAL HIGH (ref 0.44–1.00)
GFR calc Af Amer: 19 mL/min — ABNORMAL LOW (ref >60)
GFR calc non Af Amer: 17 mL/min — ABNORMAL LOW (ref >60)
Glucose, Bld: 97 mg/dL (ref 65–99)
POTASSIUM: 4.3 mmol/L (ref 3.5–5.1)
Phosphorus: 4.3 mg/dL (ref 2.5–4.6)
Sodium: 137 mmol/L (ref 135–145)

## 2015-03-06 LAB — HEMOGLOBIN A1C
Hgb A1c MFr Bld: 6.3 % — ABNORMAL HIGH (ref 4.8–5.6)
Mean Plasma Glucose: 134 mg/dL

## 2015-03-06 LAB — TROPONIN I: TROPONIN I: 0.09 ng/mL — AB (ref <0.031)

## 2015-03-06 MED ORDER — LORAZEPAM 0.5 MG PO TABS
ORAL_TABLET | ORAL | Status: AC
  Filled 2015-03-06: qty 1

## 2015-03-06 MED ORDER — HYDROMORPHONE HCL 1 MG/ML IJ SOLN
INTRAMUSCULAR | Status: AC
  Filled 2015-03-06: qty 1

## 2015-03-06 NOTE — Progress Notes (Signed)
Completed 3.5hrs of hemodialysis on a 3K bath. Slept through first 2.5hrs of Tx. Became anxious when she woke up. Gven 0.5mg  ativan PO and was able to complete Tx with much encouragement. Was able to get 3L of fluid off. Reports she is breathing better post Tx but continues to have some audible expiratory wheezing.

## 2015-03-06 NOTE — Progress Notes (Signed)
[facility] Rounding Note Subjective:  Tolerated HD yesterday with removal of 3 liters Became anxious, but after ativan tolerated treatment Says she feels much better  Objective Vital signs in last 24 hours: Filed Vitals:   03/06/15 0400 03/06/15 0430 03/06/15 0735 03/06/15 0751  BP: 109/61 125/55 122/64 121/68  Pulse: 80 81 76 79  Temp:  97.4 F (36.3 C) 98.4 F (36.9 C)   TempSrc:  Oral Oral   Resp: Height:      Weight:      SpO2: 100% 100% 100% 100%   Weight change: -0.4 kg (-14.1 oz)  Intake/Output Summary (Last 24 hours) at 03/06/15 0820 Last data filed at 03/06/15 0232  Gross per 24 hour  Intake    100 ml  Output   3000 ml  Net  -2900 ml   EDW 67.5 7/6 pre hd wt 68.7 7/6 post hd wt 66.2  Physical Exam:  BP 121/68 mmHg  Pulse 79  Temp(Src) 98.4 F (36.9 C) (Oral)  Resp 13  Ht  (1.651 m)  Wt 66.2 kg (145 lb 15.1 oz)  BMI 24.29 kg/m2  SpO2 100% Looks much more comfortable Wearing O2 Weights as noted S1S2 No S3 1/6 murmur LSB to apex Abd soft and non-tender No edema LE's Left upper arm AVF + bruit  Labs:   Recent Labs Lab 03/05/15 0310  NA 141  K 4.1  CL 102  CO2 28  GLUCOSE 128*  BUN 10  CREATININE 1.96*  CALCIUM 9.1     Recent Labs Lab 03/05/15 0310  WBC 8.9  HGB 10.5*  HCT 33.2*  MCV 97.9  PLT 218    Recent Labs Lab 03/05/15 1152 03/05/15 1713 03/05/15 2036 03/06/15 0410 03/06/15 0738  GLUCAP 215* 97 108* 131* 94   Studies/Results: Dg Chest Port 1 View  03/05/2015   CLINICAL DATA:  Shortness of breath.  Dialysis.  EXAM: PORTABLE CHEST - 1 VIEW  COMPARISON:  03/04/2015  FINDINGS: Stable cardiomegaly and aortic tortuosity. Re- demonstrated changes of CABG.  Diffuse interstitial coarsening is increased, with Kerley lines and trace pleural effusions. Chain sutures noted over the left upper lobe.  IMPRESSION: Pulmonary edema which has increased since yesterday.   Electronically Signed   By: Marnee Spring M.D.   On: 03/05/2015 02:25   Medications:   . ARIPiprazole  5 mg Oral Daily  . aspirin EC  81 mg Oral Daily  . calcitRIOL  1 mcg Oral Once per day on Tue Thu Sat  . carvedilol  6.25 mg Oral BID WC  . heparin  5,000 Units Subcutaneous 3 times per day  . insulin aspart  0-9 Units Subcutaneous TID WC  . ipratropium-albuterol  3 mL Nebulization Q6H  . multivitamin  1 tablet Oral QHS  . pantoprazole  40 mg Oral Daily  . senna  1 tablet Oral Daily  . sodium chloride  3 mL Intravenous Q12H  . sucroferric oxyhydroxide  500 mg Oral TID WC   [facility]: TTS, 3.5hr, 3K,2.25Ca, BFR 400, DW 67.5, LUA AVF,  calcitriol  Background 64yo WF with ESRD on HD at [facility] developed SOB at HD 7/5 and unable to complete her dialysis session due to resp distress. Sent to [facility] where CXR showed pulm edema (per CXR report) and thus sent to [facility]. Had not missed HD recently but signs off early due to chronic hip pain. Thinks she may have over indulged in  fluids over the weekend. Wt was up 4kg based on weight done at kidney center.  1. Pulmonary edema - had dialysis again yesterday and able to pull 3 liters of volume. By hospital weights is somewhat under EDW which probably needs to be lower and will try for additional vol with next HD. BP will be limiting. 2. ESRD usual TTS [place]. Off schedule. HD today to get back on usual schedule of TTS 3. Anemia - will check if ESA in [place].  4. HTN - carvedilol 5. DM2 - SSI 6. CAD, prior CABG 7. Ischemic CM - . ECHO 7/6 unchanged from 10/2013 EF 20-25%. Carvedilol 8. COPD - nebs and rec'd dose of solumedrol  Camille Bal, MD [facility] [phone] pager 03/06/2015, 8:20 AM

## 2015-03-06 NOTE — Progress Notes (Signed)
Utilization review completed. Wilfredo Canterbury, RN, BSN. 

## 2015-03-06 NOTE — Progress Notes (Signed)
PROGRESS NOTE  RHEMY LANNOM YTK:[id number] DOB: [birth year] DOA: 03/04/2015 PCP: No PCP Per Patient  Brief history 64 year old female with history of ischemic cardiomyopathy, chronic systolic CHF with EF 20-25%, COPD, hypertension, ESRD, DM2 presented to [facility] emergency department with hypoxemia and shortness of breath. The patient was unable to complete dialysis secondary to shortness of breath at her outpatient dialysis center. Apparently, the patient has been signing out early off dialysis without getting full treatments secondary to chronic hip pain. The patient has been only staying on dialysis for 2 hours. She was transferred to [facility] 03/04/2015 because of her need for emergent dialysis. The patient was seen by nephrology, and patient underwent dialysis on the evening of 03/04/2015 with 1 L removed.  Assessment/Plan: Acute respiratory failure -Secondary to pulmonary edema in the setting of underlying COPD -Patient has been signing off early off of dialysis in her outpatient Center -Presently stable on 2 L nasal cannula--100% -Wean oxygen for oxygen saturations greater than 92% Pulmonary edema/acute on chronic systolic CHF -Secondary to taking self off HD early -03/04/2015--2.5 hours dialysis--1L -03/05/2015--3L removed--ativan given during dialysis -Repeat echocardiogram--EF 25-30 percent, moderate aortic insufficiency--no change when compared to 11/13/2013 -Continue carvedilol -Appreciate nephrology -Suspect patient will need another dialysis session today  -patient follows up with cardiologist in [place] ESRD -Appreciate nephrology follow-up -Dialysis on Tuesday, Thursday, Saturday COPD -Patient has 40-pack-year history -Quit smoking 2010 -Continue aerosolized albuterol and Atrovent -Patient was given a one-time dose of Solu-Medrol 125mg  IV 03/05/15 @0245  -Hold additional steroids for now and monitor the patient clinically Diabetes mellitus, type  2 -Previously taking Lantus 25 units twice a day -However, patient states that she has been off of insulin for 6 months -Recheck hemoglobin A1c--6.3 -NovoLog sliding scale Hypertension -Continue carvedilol -Expect improvement in blood pressure with continued dialysis Depression/anxiety -Continue Abilify -Continue Ativan when necessary Leg edema -improved with HD  Family Communication: Pt at beside Disposition Plan: Home 03/06/15 if stable   Procedures/Studies: Dg Chest Port 1 View  03/05/2015   CLINICAL DATA:  Shortness of breath.  Dialysis.  EXAM: PORTABLE CHEST - 1 VIEW  COMPARISON:  03/04/2015  FINDINGS: Stable cardiomegaly and aortic tortuosity. Re- demonstrated changes of CABG.  Diffuse interstitial coarsening is increased, with Kerley lines and trace pleural effusions. Chain sutures noted over the left upper lobe.  IMPRESSION: Pulmonary edema which has increased since yesterday.   Electronically Signed   By: Marnee Spring M.D.   On: 03/05/2015 02:25         Subjective:  overall, patient is doing better. Denies any fevers, chills, chest discomfort, nausea, vomiting, diarrhea, abdominal pain. She has some intermittent shortness of breath but much improved from admission. Denies any cough or hemoptysis.  Objective: Filed Vitals:   03/06/15 1100 03/06/15 1200 03/06/15 1203 03/06/15 1300  BP: 105/39 92/51 92/51  108/58  Pulse: 67 69 77 73  Temp:   98 F (36.7 C)   TempSrc:   Oral   Resp: 14 14 20 21   Height:      Weight:      SpO2: 100% 100%  100%    Intake/Output Summary (Last 24 hours) at 03/06/15 1413 Last data filed at 03/06/15 1341  Gross per 24 hour  Intake    680 ml  Output   3000 ml  Net  -2320 ml   Weight change: -0.4 kg (-14.1 oz) Exam:   General:  Pt is alert, follows commands appropriately, not  in acute distress  HEENT: No icterus, No thrush, No neck mass, [employer]/AT  Cardiovascular: RRR, S1/S2, no rubs, no gallops  Respiratory: bibasilar  crackles, right greater than left. Minimal basilar wheeze.   Abdomen: Soft/+BS, non tender, non distended, no guarding  Extremities: No edema, No lymphangitis, No petechiae, No rashes, no synovitis  Data Reviewed: Basic Metabolic Panel:  Recent Labs Lab 03/05/15 0310  NA 141  K 4.1  CL 102  CO2 28  GLUCOSE 128*  BUN 10  CREATININE 1.96*  CALCIUM 9.1   Liver Function Tests: No results for input(s): AST, ALT, ALKPHOS, BILITOT, PROT, ALBUMIN in the last 168 hours. No results for input(s): LIPASE, AMYLASE in the last 168 hours. No results for input(s): AMMONIA in the last 168 hours. CBC:  Recent Labs Lab 03/05/15 0310  WBC 8.9  HGB 10.5*  HCT 33.2*  MCV 97.9  PLT 218   Cardiac Enzymes:  Recent Labs Lab 03/06/15 0020  TROPONINI 0.09*   BNP: Invalid input(s): POCBNP CBG:  Recent Labs Lab 03/05/15 1713 03/05/15 2036 03/06/15 0410 03/06/15 0738 03/06/15 1205  GLUCAP 97 108* 131* 94 162*    Recent Results (from the past 240 hour(s))  MRSA PCR Screening     Status: None   Collection Time: 03/04/15  9:35 PM  Result Value Ref Range Status   MRSA by PCR NEGATIVE NEGATIVE Final    Comment:        The GeneXpert MRSA Assay (FDA approved for NASAL specimens only), is one component of a comprehensive MRSA colonization surveillance program. It is not intended to diagnose MRSA infection nor to guide or monitor treatment for MRSA infections.      Scheduled Meds: . ARIPiprazole  5 mg Oral Daily  . aspirin EC  81 mg Oral Daily  . calcitRIOL  1 mcg Oral Once per day on Tue Thu Sat  . carvedilol  6.25 mg Oral BID WC  . heparin  5,000 Units Subcutaneous 3 times per day  . insulin aspart  0-9 Units Subcutaneous TID WC  . ipratropium-albuterol  3 mL Nebulization Q6H  . multivitamin  1 tablet Oral QHS  . pantoprazole  40 mg Oral Daily  . senna  1 tablet Oral Daily  . sodium chloride  3 mL Intravenous Q12H  . sucroferric oxyhydroxide  500 mg Oral TID WC    Continuous Infusions:    Khilee Hendricksen, DO  [facility] Pager [phone]  If 7PM-7AM, please contact night-coverage [email or web address] Password [email or web address] 03/06/2015, 2:13 PM   LOS: 2 days

## 2015-03-06 NOTE — Progress Notes (Signed)
Preliminary results by tech - Venous Duplex Lower Ext. Completed. Negative for deep and superficial vein thrombosis in both lower extremities. Aedin Jeansonne, BS, RDMS, RVT  

## 2015-03-07 DIAGNOSIS — D638 Anemia in other chronic diseases classified elsewhere: Secondary | ICD-10-CM

## 2015-03-07 LAB — GLUCOSE, CAPILLARY
GLUCOSE-CAPILLARY: 91 mg/dL (ref 65–99)
Glucose-Capillary: 141 mg/dL — ABNORMAL HIGH (ref 65–99)

## 2015-03-07 MED ORDER — CALCIUM ACETATE (PHOS BINDER) 667 MG PO TABS: 667.0000 mg | ORAL_TABLET | Freq: Three times a day (TID) | ORAL | Status: DC

## 2015-03-07 MED ORDER — LORAZEPAM 0.5 MG PO TABS: 0.5000 mg | ORAL_TABLET | Freq: Two times a day (BID) | ORAL | Status: DC | PRN

## 2015-03-07 MED ORDER — NEPRO/CARBSTEADY PO LIQD: 237.0000 mL | Freq: Two times a day (BID) | ORAL | Status: DC

## 2015-03-07 MED ORDER — CALCITRIOL 0.5 MCG PO CAPS: 1.0000 ug | ORAL_CAPSULE | ORAL | Status: DC

## 2015-03-07 MED ORDER — SUCROFERRIC OXYHYDROXIDE 500 MG PO CHEW: 500.0000 mg | CHEWABLE_TABLET | Freq: Three times a day (TID) | ORAL | Status: DC

## 2015-03-07 MED ORDER — PREDNISONE 50 MG PO TABS
60.0000 mg | ORAL_TABLET | Freq: Every day | ORAL | Status: DC
  Administered 2015-03-07: 60 mg via ORAL
  Filled 2015-03-07 (×2): qty 1

## 2015-03-07 MED ORDER — RENA-VITE PO TABS: 1.0000 | ORAL_TABLET | Freq: Every day | ORAL | Status: DC

## 2015-03-07 MED ORDER — PREDNISONE 10 MG PO TABS: 50.0000 mg | ORAL_TABLET | Freq: Every day | ORAL | Status: DC

## 2015-03-07 MED ORDER — CARVEDILOL 6.25 MG PO TABS: 6.2500 mg | ORAL_TABLET | Freq: Two times a day (BID) | ORAL | Status: DC

## 2015-03-07 NOTE — Discharge Summary (Addendum)
Physician Discharge Summary  Kathleen [name] WJX:[id number] DOB: [birth year] DOA: 03/04/2015  PCP: No PCP Per Patient  Admit date: 03/04/2015 Discharge date: 03/07/2015  Recommendations for Outpatient Follow-up:  1. Pt will need to follow up with PCP in 2 weeks post discharge 2. Please obtain BMP and CBC in 2 weeks  Discharge Diagnoses:  Acute respiratory failure -Secondary to pulmonary edema in the setting of underlying COPD -Patient has been signing off early off of dialysis in her outpatient Center -Presently stable on 2 L nasal cannula--100% -Wean oxygen for oxygen saturations greater than 92% -pt was weaned off oxygen and remained clinically stable Pulmonary edema/acute on chronic systolic CHF -Secondary to taking self off HD early -03/04/2015--2.5 hours dialysis--1L -03/05/2015--3L removed--ativan given during dialysis -03/06/15--HD inpt -03/05/15--Repeat echocardiogram--EF 25-30 percent, moderate aortic insufficiency--no change when compared to 11/13/2013 -Continue carvedilol 6.25 mg bid -Appreciate nephrology--spoke with renal whom cleared pt for d/c -resume HD on Tues-Thurs-Sat  -patient follows up with cardiologist in [place]--will need outpt followup ESRD -Appreciate nephrology follow-up -Dialysis on Tuesday, Thursday, Saturday -insurance will not pay for Velphoro; will Rx calcium acetate 667 tid with meals COPD -Patient has 40-pack-year history -Quit smoking 2010 -Continue aerosolized albuterol and Atrovent -Patient was given a one-time dose of Solu-Medrol 125mg  IV 03/05/15 @0245  -pt still had mild amount of wheezing despite dialysis  -pt will be d/c with prednisone taper 50mg , decrease by 10mg  daily Diabetes mellitus, type 2 -Previously taking Lantus 25 units twice a day -However, patient states that she has been off of insulin for 6 months -Recheck hemoglobin A1c--6.3 -NovoLog sliding scale Hypertension -Continue carvedilol -Expect improvement in blood pressure  with continued dialysis Depression/anxiety -Continue Abilify -Continue Ativan when necessary -Rx given for Ativan 0.5mg , #30, no RF to be taken before dialysis on dialysis days Leg edema -improved with HD -venous duplex neg for DVT   Discharge Condition: stable  Disposition: home  Diet:renal with 1200 fluid restrict Wt Readings from Last 3 Encounters:  03/06/15 64.7 kg (142 lb 10.2 oz)  01/24/14 58.1 kg (128 lb 1.4 oz)  01/07/14 57.7 kg (127 lb 3.3 oz)    History of present illness:  64 year old female with history of ischemic cardiomyopathy, chronic systolic CHF with EF 20-25%, COPD, hypertension, ESRD, DM2 presented to [facility] emergency department with hypoxemia and shortness of breath. The patient was unable to complete dialysis secondary to shortness of breath at her outpatient dialysis center. Apparently, the patient has been signing out early off dialysis without getting full treatments secondary to chronic hip pain. The patient has been only staying on dialysis for 2 hours. She was transferred to [facility] 03/04/2015 because of her need for emergent dialysis. The patient was seen by nephrology, and patient underwent dialysis on the evening of 03/04/2015 with 1 L removed. Pt had 3 sessions of dialysis total and was under her dry weight at time of d/c.  She still had a mild amount of wheeze despite fluid removal.  As a result, pt was d/ced with prednisone taper.  PT was resumed at time of dc.   Consultants: nephrology  Discharge Exam: Filed Vitals:   03/07/15 0847  BP: 124/63  Pulse: 69  Temp: 98.2 F (36.8 C)  Resp: 18   Filed Vitals:   03/07/15 0110 03/07/15 0525 03/07/15 0847 03/07/15 0930  BP:  94/61 124/63   Pulse: 76 71 69   Temp:  98.1 F (36.7 C) 98.2 F (36.8 C)   TempSrc:  Oral Oral   Resp: 16  17 18   Height:      Weight:      SpO2:  100% 93% 100%   General: A&O x 3, NAD, pleasant, cooperative Cardiovascular: RRR, no rub, no gallop, no  S3 Respiratory: bibasilar crackles with mild bibasilar wheeze.  Good air movement. Abdomen:soft, nontender, nondistended, positive bowel sounds Extremities: No edema, No lymphangitis, no petechiae  Discharge Instructions      Discharge Instructions    Increase activity slowly    Complete by:  As directed             Medication List    STOP taking these medications        insulin glargine 100 UNIT/ML injection  Commonly known as:  LANTUS      TAKE these medications        ARIPiprazole 5 MG tablet  Commonly known as:  ABILIFY  Take 5 mg by mouth daily.     aspirin 81 MG tablet  Take 81 mg by mouth daily.     b complex-vitamin c-folic acid 0.8 MG Tabs tablet  Take 1 tablet by mouth at bedtime.     budesonide-formoterol 160-4.5 MCG/ACT inhaler  Commonly known as:  SYMBICORT  Inhale 2 puffs into the lungs 2 (two) times daily.     calcitRIOL 0.5 MCG capsule  Commonly known as:  ROCALTROL  Take 2 capsules (1 mcg total) by mouth Every Tuesday,Thursday,and Saturday with dialysis.     calcium acetate 667 MG tablet  Commonly known as:  PHOSLO  Take 1 tablet by mouth 3 (three) times daily with meals.     carvedilol 6.25 MG tablet  Commonly known as:  COREG  Take 1 tablet (6.25 mg total) by mouth 2 (two) times daily with a meal.     citalopram 20 MG tablet  Commonly known as:  CELEXA  Take 20 mg by mouth daily.     docusate sodium 100 MG capsule  Commonly known as:  COLACE  Take 100 mg by mouth 2 (two) times daily.     feeding supplement (RESOURCE BREEZE) Liqd  Take 1 Container by mouth 2 (two) times daily between meals.     glycerin adult 2 G Supp  Generic drug:  glycerin adult  Place 1 suppository rectally daily as needed for moderate constipation.     HYDROcodone-acetaminophen 5-325 MG per tablet  Commonly known as:  NORCO/VICODIN  Take 1 tablet by mouth every 6 (six) hours as needed for moderate pain or severe pain.     ipratropium-albuterol 0.5-2.5 (3)  MG/3ML Soln  Commonly known as:  DUONEB  Take 3 mLs by nebulization every 6 (six) hours as needed (shortness of breath).     LORazepam 0.5 MG tablet  Commonly known as:  ATIVAN  Take 1 tablet (0.5 mg total) by mouth 2 (two) times daily as needed for anxiety.     nitroGLYCERIN 0.4 MG SL tablet  Commonly known as:  NITROSTAT  Place 0.4 mg under the tongue every 5 (five) minutes as needed for chest pain.     omeprazole 40 MG capsule  Commonly known as:  PRILOSEC  Take 40 mg by mouth daily.     oxyCODONE-acetaminophen 5-325 MG per tablet  Commonly known as:  PERCOCET/ROXICET  Take 1 tablet by mouth every 4 (four) hours as needed. pain     predniSONE 10 MG tablet  Commonly known as:  DELTASONE  Take 5 tablets (50 mg total) by mouth daily with breakfast. Decrease by one  tablet daily  Start taking on:  03/08/2015     senna 8.6 MG tablet  Commonly known as:  SENOKOT  Take 1 tablet by mouth daily.         The results of significant diagnostics from this hospitalization (including imaging, microbiology, ancillary and laboratory) are listed below for reference.    Significant Diagnostic Studies: Dg Chest Port 1 View  03/05/2015   CLINICAL DATA:  Shortness of breath.  Dialysis.  EXAM: PORTABLE CHEST - 1 VIEW  COMPARISON:  03/04/2015  FINDINGS: Stable cardiomegaly and aortic tortuosity. Re- demonstrated changes of CABG.  Diffuse interstitial coarsening is increased, with Kerley lines and trace pleural effusions. Chain sutures noted over the left upper lobe.  IMPRESSION: Pulmonary edema which has increased since yesterday.   Electronically Signed   By: Marnee Spring M.D.   On: 03/05/2015 02:25     Microbiology: Recent Results (from the past 240 hour(s))  MRSA PCR Screening     Status: None   Collection Time: 03/04/15  9:35 PM  Result Value Ref Range Status   MRSA by PCR NEGATIVE NEGATIVE Final    Comment:        The GeneXpert MRSA Assay (FDA approved for NASAL specimens only), is  one component of a comprehensive MRSA colonization surveillance program. It is not intended to diagnose MRSA infection nor to guide or monitor treatment for MRSA infections.      Labs: Basic Metabolic Panel:  Recent Labs Lab 03/05/15 0310 03/06/15 1645  NA 141 137  K 4.1 4.3  CL 102 100*  CO2 28 29  GLUCOSE 128* 97  BUN 10 18  CREATININE 1.96* 2.78*  CALCIUM 9.1 8.7*  PHOS  --  4.3   Liver Function Tests:  Recent Labs Lab 03/06/15 1645  ALBUMIN 2.8*   No results for input(s): LIPASE, AMYLASE in the last 168 hours. No results for input(s): AMMONIA in the last 168 hours. CBC:  Recent Labs Lab 03/05/15 0310  WBC 8.9  HGB 10.5*  HCT 33.2*  MCV 97.9  PLT 218   Cardiac Enzymes:  Recent Labs Lab 03/06/15 0020  TROPONINI 0.09*   BNP: Invalid input(s): POCBNP CBG:  Recent Labs Lab 03/06/15 1205 03/06/15 1611 03/06/15 2020 03/07/15 0744 03/07/15 1214  GLUCAP 162* 97 97 91 141*    Time coordinating discharge:  Greater than 30 minutes  Signed:  Orestes Geiman, DO [facility] Pager: [phone] 03/07/2015, 4:29 PM

## 2015-03-07 NOTE — Care Management (Signed)
Important Message  Patient Details  Name: Kathleen [name] MRN: [id number] Date of Birth: 11/19/1950   Medicare Important Message Given:  Yes-second notification given    Kyla Balzarine 03/07/2015, 9:44 AM

## 2015-03-07 NOTE — Evaluation (Signed)
Physical Therapy Evaluation Patient Details Name: Kathleen [name] MRN: [id number] DOB: 1950/11/05 Today's Date: 03/07/2015   History of Present Illness  Patient is a 64 yo female admitted 03/04/15 with hypoxemia and pulmonary edema.  PMH:   ESRD on HD ( dialyzed today), Chronic systolic CHF, COPD, HTN, DM2   Clinical Impression  Patient and husband report patient close to baseline functional level.  Ready for d/c from PT perspective.  Recommend f/u HHPT for home safety evaluation and continued therapy as appropriate.    Follow Up Recommendations Home health PT;Supervision/Assistance - 24 hour    Equipment Recommendations  None recommended by PT    Recommendations for Other Services       Precautions / Restrictions Precautions Precautions: Fall Restrictions Weight Bearing Restrictions: No      Mobility  Bed Mobility Overal bed mobility: Needs Assistance Bed Mobility: Supine to Sit;Sit to Supine     Supine to sit: Min guard Sit to supine: Min guard   General bed mobility comments: Assist for safety.  Transfers Overall transfer level: Needs assistance Equipment used: Rolling walker (2 wheeled) Transfers: Sit to/from Stand Sit to Stand: Min assist         General transfer comment: Verbal cues for hand placement.  Assist to steady during transfer.  Ambulation/Gait Ambulation/Gait assistance: Min guard Ambulation Distance (Feet): 20 Feet Assistive device: Rolling walker (2 wheeled) Gait Pattern/deviations: Step-through pattern;Decreased step length - right;Decreased step length - left;Decreased stride length;Shuffle;Trunk flexed Gait velocity: Decreased Gait velocity interpretation: Below normal speed for age/gender General Gait Details: Verbal cues to stand upright during gait.  Patient demonstrates safe use of RW.  Distance limited due to wounds on feet - sore with weight bearing  Stairs            Wheelchair Mobility    Modified Rankin (Stroke Patients  Only)       Balance                                             Pertinent Vitals/Pain Pain Assessment: 0-10 Pain Score: 4  Pain Location: "all over" Pain Descriptors / Indicators: Aching Pain Intervention(s): Monitored during session;Repositioned    Home Living Family/patient expects to be discharged to:: Private residence Living Arrangements: Spouse/significant other Available Help at Discharge: Family;Available 24 hours/day Type of Home: House       Home Layout: One level Home Equipment: [place] - 2 wheels;Bedside commode;Wheelchair - manual      Prior Function Level of Independence: Independent with assistive device(s);Needs assistance   Gait / Transfers Assistance Needed: Ambulated short distances with RW.  Used w/c primarily during day.  ADL's / Homemaking Assistance Needed: Assist with meal prep, housekeeping, and set-up for sponge bath.        Hand Dominance        Extremity/Trunk Assessment   Upper Extremity Assessment: Generalized weakness           Lower Extremity Assessment: Generalized weakness         Communication   Communication: No difficulties  Cognition Arousal/Alertness: Awake/alert Behavior During Therapy: Flat affect Overall Cognitive Status: Within Functional Limits for tasks assessed                      General Comments      Exercises        Assessment/Plan  PT Assessment All further PT needs can be met in the next venue of care  PT Diagnosis Abnormality of gait;Generalized weakness;Acute pain   PT Problem List Decreased strength;Decreased activity tolerance;Decreased balance;Decreased mobility;Pain;Decreased skin integrity  PT Treatment Interventions     PT Goals (Current goals can be found in the Care Plan section) Acute Rehab PT Goals PT Goal Formulation:  (PT needs to be addressed with HHPT)    Frequency     Barriers to discharge        Co-evaluation               End  of Session Equipment Utilized During Treatment: Gait belt Activity Tolerance: Patient tolerated treatment well;Patient limited by pain Patient left: in bed;with call bell/phone within reach;with family/visitor present Nurse Communication: Mobility status (Ready for d/c from PT perspective)         Time: 3094-0768 PT Time Calculation (min) (ACUTE ONLY): 14 min   Charges:   PT Evaluation $Initial PT Evaluation Tier I: 1 Procedure     PT G CodesDespina [name] 27-Mar-2015, 3:21 PM Kathleen [name]. Kathleen [name], Kathleen [name] Pager [phone]

## 2015-03-07 NOTE — Care Management Note (Signed)
Case Management Note  Patient Details  Name: Kathleen [name] MRN: [id number] Date of Birth: 1950/12/20  Subjective/Objective:               CM following for progression and d/c planning.     Action/Plan: 03/07/15 Met with pt and pt selected [facility] for HHPT, pt has walker and no other DME needs identified.   Expected Discharge Date:       03/07/2015           Expected Discharge Plan:  [place]  In-House Referral:  NA  Discharge planning Services  CM Consult  Post Acute Care Choice:  NA Choice offered to:  NA  DME Arranged:    DME Agency:     HH Arranged:  PT HH Agency:  [facility]  Status of Service:  Completed, signed off  Medicare Important Message Given:  Yes-second notification given Date Medicare IM Given:    Medicare IM give by:    Date Additional Medicare IM Given:    Additional Medicare Important Message give by:     If discussed at [place] of Stay Meetings, dates discussed:    Additional Comments:  Adron Bene, RN 03/07/2015, 1:29 PM

## 2015-03-07 NOTE — Progress Notes (Signed)
Initial Nutrition Assessment  DOCUMENTATION CODES:  Not applicable  INTERVENTION:  Pt to be discharged today.  Encourage adequate PO intake.   NUTRITION DIAGNOSIS:  Increased nutrient needs related to chronic illness as evidenced by estimated needs.  GOAL:  Patient will meet greater than or equal to 90% of their needs  MONITOR:  PO intake, Supplement acceptance, Weight trends, Labs, I & O's  REASON FOR ASSESSMENT:  Consult  (Protein malnutrition)  ASSESSMENT: Pt with a history of ESRD on HD, Chronic systolic CHF, COPD, HTN, DM2 who was seen at the [facility] due to worsening SOB and was found to have hypoxemia and pulmonary Edema and was placed on [place] and arrangements were made to transfer to [facility] for Emergent Dialysis.  Pt reports having a good appetite currently and PTA at home eating at least 3 meals a day. Current meal completion has been 75-100%. Weight has been stable. No malnutrition identified during current admission. Pt currently has Nepro shake ordered, however reports in the past it has caused her to have high blood sugar, thus will not drink it. RD to discontinue orders. Pt was encouraged to eat her food at meals. Plans for pt to be discharged today.   NFPE completed. Pt with no observed significant fat or muscle mass loss.   Potassium and phosphorous WNL.  Height:  Ht Readings from Last 1 Encounters:  03/06/15 5' 5.5" (1.664 m)    Weight:  Wt Readings from Last 1 Encounters:  03/06/15 142 lb 10.2 oz (64.7 kg)    Ideal Body Weight:  57.95 kg  Wt Readings from Last 10 Encounters:  03/06/15 142 lb 10.2 oz (64.7 kg)  01/24/14 128 lb 1.4 oz (58.1 kg)  01/07/14 127 lb 3.3 oz (57.7 kg)  11/16/13 142 lb 10.2 oz (64.7 kg)    BMI:  Body mass index is 23.37 kg/(m^2).  Estimated Nutritional Needs:  Kcal:  1800-2000  Protein:  85-100 grams  Fluid:  1.2 L/day  Skin:   (+1 LE, +1 facial edema)  Diet Order:  Diet renal/carb  modified with fluid restriction Diet-HS Snack?: Nothing; Room service appropriate?: Yes; Fluid consistency:: Thin  EDUCATION NEEDS:  No education needs identified at this time   Intake/Output Summary (Last 24 hours) at 03/07/15 1219 Last data filed at 03/07/15 0917  Gross per 24 hour  Intake    960 ml  Output   1000 ml  Net    -40 ml    Last BM:  7/7  Kathleen Smiling, MS, RD, LDN Pager # [phone] After hours/ weekend pager # [phone]

## 2015-03-07 NOTE — Progress Notes (Signed)
[facility] Progress Note  Assessment/Plan: 1. Pulmonary edema - HD yesterday. Continues to have coarse/tight breath sounds. On low flow O2.  2. ESRD usual TTS [place].  3. Hypertension/Volume:  Had HD yesterday. Net UF 1000.  No edema. HD tomorrow. Below EDW. Will need new EDW upon discharge. 4. Anemia - will check if ESA in [place].  5. Nutrition: Albumin 2.8. On renal diet. Start Nepro. Dietary consult.  6. DM2 - SSI per primary team. 7. CAD, prior CABG 8. Ischemic CM - . ECHO 7/6 unchanged from 10/2013 EF 20-25%. Carvedilol 9. COPD - nebs and rec'd dose of solumedrol  Rita H. Brown NP-C 03/07/2015, 9:44 AM  [facility] [phone]  Pt seen, examined and agree w A/P as above.  Vinson Moselle MD pager [phone]    cell [phone] 03/07/2015, 11:41 AM    Background [name] with ESRD on HD at [facility] developed SOB at HD 7/5 and unable to complete her dialysis session due to resp distress. Sent to [facility] where CXR showed pulm edema (per CXR report) and thus sent to [facility]. Has not missed HD recently but signs off early due to chronic hip pain. Thinks she may have over indulged in fluids over the weekend. Wt was up 4kg based on weight done at kidney center. Labs from [place] reviewed. Initial troponin Nl.  Subjective:  "I feel OK". Denies SOB, Chest pain. c/o thirst and hip pain.  Objective Filed Vitals:   03/07/15 0110 03/07/15 0525 03/07/15 0847 03/07/15 0930  BP:  94/61 124/63   Pulse: 76 71 69   Temp:  98.1 F (36.7 C) 98.2 F (36.8 C)   TempSrc:  Oral Oral   Resp: Height:      Weight:      SpO2:  100% 93% 100%   Physical Exam General: pleasant well nourished female in NAD Heart: S1,S2, RRR. No M/G/R Lungs: Bilateral breath sounds decreased in bases with few scattered crackles in bases, few scattered inspiratory wheezes.  Abdomen: soft, nontender. Active Bowel sounds.  Extremities: No UE/LE edema. Red  petechial type rash bilateral shins (chronic) Dialysis Access: LUA AVF +thrill/+bruit.  Dialysis Orders: [facility]: TTS, 3.5hr, 3K,2.25Ca, BFR 400, DW 67.5, LUA AVF, calcitriol  Additional Objective Labs: Basic Metabolic Panel:  Recent Labs Lab 03/05/15 0310 03/06/15 1645  NA 141 137  K 4.1 4.3  CL 102 100*  CO2 28 29  GLUCOSE 128* 97  BUN 10 18  CREATININE 1.96* 2.78*  CALCIUM 9.1 8.7*  PHOS  --  4.3   Liver Function Tests:  Recent Labs Lab 03/06/15 1645  ALBUMIN 2.8*   No results for input(s): LIPASE, AMYLASE in the last 168 hours. CBC:  Recent Labs Lab 03/05/15 0310  WBC 8.9  HGB 10.5*  HCT 33.2*  MCV 97.9  PLT 218   Blood Culture    Component Value Date/Time   SDES BLOOD RIGHT HAND 01/02/2014 2348   SPECREQUEST BOTTLES DRAWN AEROBIC ONLY 10CC 01/02/2014 2348   CULT  01/02/2014 2348    NO GROWTH 5 DAYS Performed at [facility]   REPTSTATUS 01/09/2014 FINAL 01/02/2014 2348    Cardiac Enzymes:  Recent Labs Lab 03/06/15 0020  TROPONINI 0.09*   CBG:  Recent Labs Lab 03/06/15 0738 03/06/15 1205 03/06/15 1611 03/06/15 2020 03/07/15 0744  GLUCAP 94 162* 97 97 91   Iron Studies: No results for input(s): IRON, TIBC, TRANSFERRIN, FERRITIN in the last 72  hours. @lablastinr3 @ Studies/Results: No results found. Medications:   . ARIPiprazole  5 mg Oral Daily  . aspirin EC  81 mg Oral Daily  . calcitRIOL  1 mcg Oral Once per day on Tue Thu Sat  . carvedilol  6.25 mg Oral BID WC  . heparin  5,000 Units Subcutaneous 3 times per day  . insulin aspart  0-9 Units Subcutaneous TID WC  . ipratropium-albuterol  3 mL Nebulization Q6H  . multivitamin  1 tablet Oral QHS  . pantoprazole  40 mg Oral Daily  . senna  1 tablet Oral Daily  . sodium chloride  3 mL Intravenous Q12H  . sucroferric oxyhydroxide  500 mg Oral TID WC

## 2015-03-07 NOTE — Progress Notes (Signed)
Pt being discharged home via wheelchair with family. Pt alert and oriented x4. VSS. Pt c/o no pain at this time. No signs of respiratory distress. PT signed off on pt and okayed for her to go home. Education complete and care plans resolved. IV removed with catheter intact and pt tolerated well. No further issues at this time. Pt to follow up with PCP. Jillyn Hidden, RN

## 2015-03-13 ENCOUNTER — Encounter (HOSPITAL_COMMUNITY): Payer: Self-pay | Admitting: Nurse Practitioner

## 2015-03-13 ENCOUNTER — Emergency Department (HOSPITAL_COMMUNITY)
Admission: EM | Admit: 2015-03-13 | Discharge: 2015-03-13 | Disposition: A | Discharge status: Home / Self Care | Payer: Medicare Other | Attending: Emergency Medicine | Admitting: Emergency Medicine

## 2015-03-13 ENCOUNTER — Emergency Department (HOSPITAL_COMMUNITY): Payer: Medicare Other

## 2015-03-13 DIAGNOSIS — I5022 Chronic systolic (congestive) heart failure: Secondary | ICD-10-CM | POA: Diagnosis not present

## 2015-03-13 DIAGNOSIS — E119 Type 2 diabetes mellitus without complications: Secondary | ICD-10-CM | POA: Insufficient documentation

## 2015-03-13 DIAGNOSIS — J441 Chronic obstructive pulmonary disease with (acute) exacerbation: Secondary | ICD-10-CM | POA: Diagnosis not present

## 2015-03-13 DIAGNOSIS — Z79899 Other long term (current) drug therapy: Secondary | ICD-10-CM | POA: Diagnosis not present

## 2015-03-13 DIAGNOSIS — Z87891 Personal history of nicotine dependence: Secondary | ICD-10-CM | POA: Diagnosis not present

## 2015-03-13 DIAGNOSIS — I129 Hypertensive chronic kidney disease with stage 1 through stage 4 chronic kidney disease, or unspecified chronic kidney disease: Secondary | ICD-10-CM | POA: Insufficient documentation

## 2015-03-13 DIAGNOSIS — N189 Chronic kidney disease, unspecified: Secondary | ICD-10-CM | POA: Insufficient documentation

## 2015-03-13 DIAGNOSIS — R109 Unspecified abdominal pain: Secondary | ICD-10-CM | POA: Insufficient documentation

## 2015-03-13 DIAGNOSIS — Z7982 Long term (current) use of aspirin: Secondary | ICD-10-CM | POA: Insufficient documentation

## 2015-03-13 DIAGNOSIS — Z862 Personal history of diseases of the blood and blood-forming organs and certain disorders involving the immune mechanism: Secondary | ICD-10-CM | POA: Diagnosis not present

## 2015-03-13 DIAGNOSIS — R079 Chest pain, unspecified: Secondary | ICD-10-CM | POA: Insufficient documentation

## 2015-03-13 LAB — CBC WITH DIFFERENTIAL/PLATELET
BASOS PCT: 0 % (ref 0–1)
Basophils Absolute: 0 10*3/uL (ref 0.0–0.1)
Eosinophils Absolute: 0.3 10*3/uL (ref 0.0–0.7)
Eosinophils Relative: 4 % (ref 0–5)
HEMATOCRIT: 36.7 % (ref 36.0–46.0)
HEMOGLOBIN: 11.8 g/dL — AB (ref 12.0–15.0)
Lymphocytes Relative: 14 % (ref 12–46)
Lymphs Abs: 1.3 10*3/uL (ref 0.7–4.0)
MCH: 30.8 pg (ref 26.0–34.0)
MCHC: 32.2 g/dL (ref 30.0–36.0)
MCV: 95.8 fL (ref 78.0–100.0)
Monocytes Absolute: 0.7 10*3/uL (ref 0.1–1.0)
Monocytes Relative: 7 % (ref 3–12)
Neutro Abs: 7.1 10*3/uL (ref 1.7–7.7)
Neutrophils Relative %: 75 % (ref 43–77)
PLATELETS: 232 10*3/uL (ref 150–400)
RBC: 3.83 MIL/uL — ABNORMAL LOW (ref 3.87–5.11)
RDW: 17 % — ABNORMAL HIGH (ref 11.5–15.5)
WBC: 9.5 10*3/uL (ref 4.0–10.5)

## 2015-03-13 LAB — HEPATIC FUNCTION PANEL
ALBUMIN: 3.3 g/dL — AB (ref 3.5–5.0)
ALK PHOS: 119 U/L (ref 38–126)
ALT: 45 U/L (ref 14–54)
AST: 25 U/L (ref 15–41)
BILIRUBIN DIRECT: 0.2 mg/dL (ref 0.1–0.5)
Indirect Bilirubin: 0.3 mg/dL (ref 0.3–0.9)
Total Bilirubin: 0.5 mg/dL (ref 0.3–1.2)
Total Protein: 6.6 g/dL (ref 6.5–8.1)

## 2015-03-13 LAB — BASIC METABOLIC PANEL
Anion gap: 10 (ref 5–15)
BUN: 56 mg/dL — AB (ref 6–20)
CHLORIDE: 98 mmol/L — AB (ref 101–111)
CO2: 27 mmol/L (ref 22–32)
Calcium: 8.9 mg/dL (ref 8.9–10.3)
Creatinine, Ser: 3.45 mg/dL — ABNORMAL HIGH (ref 0.44–1.00)
GFR calc Af Amer: 15 mL/min — ABNORMAL LOW (ref >60)
GFR calc non Af Amer: 13 mL/min — ABNORMAL LOW (ref >60)
Glucose, Bld: 187 mg/dL — ABNORMAL HIGH (ref 65–99)
POTASSIUM: 4.9 mmol/L (ref 3.5–5.1)
Sodium: 135 mmol/L (ref 135–145)

## 2015-03-13 LAB — I-STAT TROPONIN, ED: TROPONIN I, POC: 0.04 ng/mL (ref 0.00–0.08)

## 2015-03-13 LAB — LACTIC ACID, PLASMA: Lactic Acid, Venous: 1.4 mmol/L (ref 0.5–2.0)

## 2015-03-13 MED ORDER — HYDROMORPHONE HCL 1 MG/ML IJ SOLN
1.0000 mg | Freq: Once | INTRAMUSCULAR | Status: AC
Start: 2015-03-13 — End: 2015-03-13
  Administered 2015-03-13: 1 mg via INTRAVENOUS
  Filled 2015-03-13: qty 1

## 2015-03-13 MED ORDER — NITROGLYCERIN 0.4 MG SL SUBL
0.4000 mg | SUBLINGUAL_TABLET | SUBLINGUAL | Status: DC | PRN
Start: 2015-03-13 — End: 2015-03-14
  Administered 2015-03-13: 0.4 mg via SUBLINGUAL
  Filled 2015-03-13: qty 1

## 2015-03-13 NOTE — ED Provider Notes (Signed)
CSN: [id number]     Arrival date & time 03/13/15  1508 History   First MD Initiated Contact with Patient 03/13/15 [phone]     Chief Complaint  Patient presents with  . Chest Pain     (Consider location/radiation/quality/duration/timing/severity/associated sxs/prior Treatment) Patient is a 64 y.o. female presenting with chest pain.  Chest Pain Pain location:  Substernal area Pain quality: pressure   Pain radiates to:  Does not radiate Pain radiates to the back: no   Pain severity:  Severe Onset quality:  Sudden Duration:  2 hours Timing:  Constant Progression:  Partially resolved Chronicity:  Recurrent Context comment:  Occurred during dialysis Relieved by:  Nothing Worsened by:  Coughing, movement and deep breathing Ineffective treatments:  None tried Associated symptoms: abdominal pain (for 2 weeks, no acute change), lower extremity edema (chronically), orthopnea and shortness of breath (chronically)   Associated symptoms: no nausea and no PND     Past Medical History  Diagnosis Date  . Chronic kidney disease   . Diabetes mellitus without complication   . Hypertension   . COPD (chronic obstructive pulmonary disease)   . Ischemic cardiomyopathy   . Anemia   . Family history of anesthesia complication     MOTHER HAD NAUSEA  . CHF (congestive heart failure)     CHRONIC SYSTOLIC  . Shortness of breath    Past Surgical History  Procedure Laterality Date  . Knee cartilage surgery Left 2007  . Video assisted thoracoscopy (vats)/ lymph node sampling Left 2007  . Incontinence surgery  ?2006  . Coronary artery bypass graft  2010    x 1   No family history on file. History  Substance Use Topics  . Smoking status: Former Smoker -- 80 years    Quit date: 08/30/2008  . Smokeless tobacco: Never Used  . Alcohol Use: No   OB History    No data available     Review of Systems  Respiratory: Positive for shortness of breath (chronically).   Cardiovascular: Positive for  chest pain and orthopnea. Negative for PND.  Gastrointestinal: Positive for abdominal pain (for 2 weeks, no acute change). Negative for nausea.  All other systems reviewed and are negative.     Allergies  Statins; Levaquin; Metformin and related; Other; Sulfa antibiotics; and Tobradex  Home Medications   Prior to Admission medications   Medication Sig Start Date End Date Taking? Authorizing Provider  ARIPiprazole (ABILIFY) 5 MG tablet Take 5 mg by mouth daily.   Yes Historical Provider, MD  aspirin 81 MG tablet Take 81 mg by mouth daily.   Yes Historical Provider, MD  b complex-vitamin c-folic acid (NEPHRO-VITE) 0.8 MG TABS tablet Take 1 tablet by mouth at bedtime.   Yes Historical Provider, MD  budesonide-formoterol (SYMBICORT) 160-4.5 MCG/ACT inhaler Inhale 2 puffs into the lungs 2 (two) times daily.   Yes Historical Provider, MD  calcium acetate (PHOSLO) 667 MG tablet Take 1 tablet by mouth 3 (three) times daily with meals. 03/07/15  Yes Catarina Hartshorn, MD  carvedilol (COREG) 6.25 MG tablet Take 1 tablet (6.25 mg total) by mouth 2 (two) times daily with a meal. 03/07/15  Yes Catarina Hartshorn, MD  citalopram (CELEXA) 20 MG tablet Take 20 mg by mouth daily.   Yes Historical Provider, MD  docusate sodium (COLACE) 100 MG capsule Take 100 mg by mouth 2 (two) times daily.    Yes Historical Provider, MD  glycerin adult (GLYCERIN ADULT) 2 G SUPP Place 1 suppository rectally daily  as needed for moderate constipation.   Yes Historical Provider, MD  ipratropium-albuterol (DUONEB) 0.5-2.5 (3) MG/3ML SOLN Take 3 mLs by nebulization 2 (two) times daily as needed (shortness of breath).    Yes Historical Provider, MD  LORazepam (ATIVAN) 0.5 MG tablet Take 1 tablet (0.5 mg total) by mouth 2 (two) times daily as needed for anxiety. Patient taking differently: Take 0.5 mg by mouth 3 (three) times daily as needed for anxiety.  03/07/15  Yes Catarina Hartshorn, MD  omeprazole (PRILOSEC) 40 MG capsule Take 40 mg by mouth daily.   Yes  Historical Provider, MD  oxyCODONE-acetaminophen (PERCOCET/ROXICET) 5-325 MG per tablet Take 1 tablet by mouth every 4 (four) hours as needed for moderate pain. pain 02/24/15  Yes Historical Provider, MD  senna (SENOKOT) 8.6 MG tablet Take 1 tablet by mouth daily.   Yes Historical Provider, MD  calcitRIOL (ROCALTROL) 0.5 MCG capsule Take 2 capsules (1 mcg total) by mouth Every Tuesday,Thursday,and Saturday with dialysis. 03/07/15   Catarina Hartshorn, MD  feeding supplement, RESOURCE BREEZE, (RESOURCE BREEZE) LIQD Take 1 Container by mouth 2 (two) times daily between meals. Patient not taking: Reported on 03/13/2015 01/24/14   Bobbye Charleston, MD  HYDROcodone-acetaminophen (NORCO/VICODIN) 5-325 MG per tablet Take 1 tablet by mouth every 6 (six) hours as needed for moderate pain or severe pain. Patient not taking: Reported on 03/13/2015 01/24/14   Bobbye Charleston, MD  nitroGLYCERIN (NITROSTAT) 0.4 MG SL tablet Place 0.4 mg under the tongue every 5 (five) minutes as needed for chest pain.    Historical Provider, MD  predniSONE (DELTASONE) 10 MG tablet Take 5 tablets (50 mg total) by mouth daily with breakfast. Decrease by one tablet daily Patient not taking: Reported on 03/13/2015 03/08/15   Catarina Hartshorn, MD   BP 109/54 mmHg  Pulse 70  Temp(Src) 98.4 F (36.9 C) (Oral)  Resp 17  Ht 5' 5.5" (1.664 m)  Wt 145 lb 15.1 oz (66.2 kg)  BMI 23.91 kg/m2  SpO2 97% Physical Exam  Constitutional: She is oriented to person, place, and time. She appears well-developed and well-nourished.  HENT:  Head: Normocephalic and atraumatic.  Right Ear: External ear normal.  Left Ear: External ear normal.  Eyes: Conjunctivae and EOM are normal. Pupils are equal, round, and reactive to light.  Neck: Normal range of motion. Neck supple.  Cardiovascular: Normal rate, regular rhythm, normal heart sounds and intact distal pulses.   Pulmonary/Chest: Effort normal. She has wheezes (diffuse expiratory). She has rales in the  right lower field and the left lower field.  Abdominal: Soft. Bowel sounds are normal. There is no tenderness.  Musculoskeletal: Normal range of motion.  Neurological: She is alert and oriented to person, place, and time.  Skin: Skin is warm and dry.  Vitals reviewed.   ED Course  Procedures (including critical care time) Labs Review Labs Reviewed  CBC WITH DIFFERENTIAL/PLATELET - Abnormal; Notable for the following:    RBC 3.83 (*)    Hemoglobin 11.8 (*)    RDW 17.0 (*)    All other components within normal limits  BASIC METABOLIC PANEL - Abnormal; Notable for the following:    Chloride 98 (*)    Glucose, Bld 187 (*)    BUN 56 (*)    Creatinine, Ser 3.45 (*)    GFR calc non Af Amer 13 (*)    GFR calc Af Amer 15 (*)    All other components within normal limits  HEPATIC FUNCTION PANEL - Abnormal;  Notable for the following:    Albumin 3.3 (*)    All other components within normal limits  LACTIC ACID, PLASMA  LACTIC ACID, PLASMA  LACTIC ACID, PLASMA  I-STAT TROPOININ, ED  I-STAT TROPOININ, ED    Imaging Review Dg Chest 2 View  03/13/2015   CLINICAL DATA:  Onset of chest pain and shortness of breath today; similar symptoms 3 days ago prompting an ED visit. , history of COPD, CHF, CABG, and diabetes.  EXAM: CHEST  2 VIEW  COMPARISON:  Portable chest x-ray of March 05, 2015  FINDINGS: The lungs are well-expanded. The pulmonary interstitial markings remain increased. There is surgical coils suture material peripherally in the left upper hemi thorax which is stable. The cardiac silhouette remains enlarged and the pulmonary vascularity is mildly engorged. There is no pleural effusion or alveolar infiltrate. There are 6 intact sternal wires. Coronary artery graft markers are present. The bony thorax exhibits no acute abnormalities.  IMPRESSION: CHF with mild pulmonary interstitial edema not greatly changed since the previous study allowing for differences in radiographic technique.    Electronically Signed   By: David  [place] M.D.   On: 03/13/2015 17:08     EKG Interpretation   Date/Time:  Thursday March 13 2015 15:32:39 EDT Ventricular Rate:  78 PR Interval:  181 QRS Duration: 119 QT Interval:  449 QTC Calculation: 511 R Axis:   94 Text Interpretation:  Sinus or ectopic atrial rhythm Left posterior  fascicular block Consider left ventricular hypertrophy Repol abnrm  suggests ischemia, lateral leads  which is new Prolonged QT interval  Confirmed by DOCHERTY  MD, MEGAN ([id number]) on 03/13/2015 3:43:29 PM      MDM   Final diagnoses:  Chest pain, unspecified chest pain type    64 y.o. female with pertinent PMH of ESRD, CAD sp stenting, COPD presents with chest pain as above.  Occurred during dialysis.  Pt has a ho stopping dialysis early for cramping, with recent admission for respiratory failure as a result.  On arrival today patient complains of 7 out of 10 pain, however pain is exactly reproduced with palpation of chest wall, she has not had shortness of breath, history atypical for ACS, unlikely PE, likely musculoskeletal. Workup with 2 negative troponins at 6 hour interval.  Pain not improved with nitro, but relieved with dilaudid.  A detailed discussion regarding return precautions and close follow-up with cardiology, patient voiced understanding and elected to return home. I did offer admission which the patient refused. Discharged home in stable condition.  I have reviewed all laboratory and imaging studies if ordered as above  1. Chest pain, unspecified chest pain type         Mirian Mo, MD 03/14/15 [phone]

## 2015-03-13 NOTE — ED Notes (Signed)
Patient transported to X-ray 

## 2015-03-13 NOTE — Discharge Instructions (Signed)

## 2015-03-13 NOTE — ED Notes (Signed)
Lab results not crossing over. i-STAT troponin showed to MD.  

## 2015-03-13 NOTE — ED Notes (Signed)
Per EMS pt was getting dialyzed and half way through begun c/o 5/10 central chest pain, with mild shortness of breath. Pt described as chest heaviness- pt did not receive ASA or nitroglycerin. Patient endorses cough x2 weeks with brown productive sputum.

## 2015-03-14 LAB — I-STAT TROPONIN, ED: Troponin i, poc: 0.05 ng/mL (ref 0.00–0.08)

## 2015-03-14 LAB — LACTIC ACID, PLASMA: Lactic Acid, Venous: 0.9 mmol/L (ref 0.5–2.0)

## 2015-05-05 ENCOUNTER — Emergency Department (HOSPITAL_COMMUNITY): Payer: Medicare Other

## 2015-05-05 ENCOUNTER — Inpatient Hospital Stay (HOSPITAL_COMMUNITY)
Admission: EM | Admit: 2015-05-05 | Discharge: 2015-05-09 | DRG: 444 | Disposition: A | Discharge status: Home Health | Payer: Medicare Other | Attending: Internal Medicine | Admitting: Internal Medicine

## 2015-05-05 ENCOUNTER — Encounter (HOSPITAL_COMMUNITY): Payer: Self-pay | Admitting: Emergency Medicine

## 2015-05-05 DIAGNOSIS — I5023 Acute on chronic systolic (congestive) heart failure: Secondary | ICD-10-CM | POA: Diagnosis present

## 2015-05-05 DIAGNOSIS — R627 Adult failure to thrive: Secondary | ICD-10-CM | POA: Insufficient documentation

## 2015-05-05 DIAGNOSIS — Z888 Allergy status to other drugs, medicaments and biological substances status: Secondary | ICD-10-CM

## 2015-05-05 DIAGNOSIS — Z7902 Long term (current) use of antithrombotics/antiplatelets: Secondary | ICD-10-CM

## 2015-05-05 DIAGNOSIS — K819 Cholecystitis, unspecified: Secondary | ICD-10-CM | POA: Diagnosis not present

## 2015-05-05 DIAGNOSIS — I739 Peripheral vascular disease, unspecified: Secondary | ICD-10-CM | POA: Diagnosis present

## 2015-05-05 DIAGNOSIS — Z6824 Body mass index (BMI) 24.0-24.9, adult: Secondary | ICD-10-CM

## 2015-05-05 DIAGNOSIS — Z87891 Personal history of nicotine dependence: Secondary | ICD-10-CM

## 2015-05-05 DIAGNOSIS — Z992 Dependence on renal dialysis: Secondary | ICD-10-CM

## 2015-05-05 DIAGNOSIS — Z951 Presence of aortocoronary bypass graft: Secondary | ICD-10-CM

## 2015-05-05 DIAGNOSIS — K8000 Calculus of gallbladder with acute cholecystitis without obstruction: Secondary | ICD-10-CM | POA: Diagnosis present

## 2015-05-05 DIAGNOSIS — E1122 Type 2 diabetes mellitus with diabetic chronic kidney disease: Secondary | ICD-10-CM | POA: Diagnosis present

## 2015-05-05 DIAGNOSIS — I255 Ischemic cardiomyopathy: Secondary | ICD-10-CM | POA: Diagnosis present

## 2015-05-05 DIAGNOSIS — N390 Urinary tract infection, site not specified: Secondary | ICD-10-CM | POA: Diagnosis present

## 2015-05-05 DIAGNOSIS — R109 Unspecified abdominal pain: Secondary | ICD-10-CM

## 2015-05-05 DIAGNOSIS — E119 Type 2 diabetes mellitus without complications: Secondary | ICD-10-CM

## 2015-05-05 DIAGNOSIS — Z79891 Long term (current) use of opiate analgesic: Secondary | ICD-10-CM

## 2015-05-05 DIAGNOSIS — I5022 Chronic systolic (congestive) heart failure: Secondary | ICD-10-CM | POA: Diagnosis present

## 2015-05-05 DIAGNOSIS — N186 End stage renal disease: Secondary | ICD-10-CM

## 2015-05-05 DIAGNOSIS — Z882 Allergy status to sulfonamides status: Secondary | ICD-10-CM

## 2015-05-05 DIAGNOSIS — N2581 Secondary hyperparathyroidism of renal origin: Secondary | ICD-10-CM | POA: Diagnosis present

## 2015-05-05 DIAGNOSIS — Z79899 Other long term (current) drug therapy: Secondary | ICD-10-CM

## 2015-05-05 DIAGNOSIS — K8001 Calculus of gallbladder with acute cholecystitis with obstruction: Secondary | ICD-10-CM | POA: Diagnosis not present

## 2015-05-05 DIAGNOSIS — I120 Hypertensive chronic kidney disease with stage 5 chronic kidney disease or end stage renal disease: Secondary | ICD-10-CM | POA: Diagnosis present

## 2015-05-05 DIAGNOSIS — K8050 Calculus of bile duct without cholangitis or cholecystitis without obstruction: Secondary | ICD-10-CM | POA: Insufficient documentation

## 2015-05-05 DIAGNOSIS — I25708 Atherosclerosis of coronary artery bypass graft(s), unspecified, with other forms of angina pectoris: Secondary | ICD-10-CM | POA: Insufficient documentation

## 2015-05-05 DIAGNOSIS — D649 Anemia, unspecified: Secondary | ICD-10-CM | POA: Diagnosis present

## 2015-05-05 DIAGNOSIS — I25119 Atherosclerotic heart disease of native coronary artery with unspecified angina pectoris: Secondary | ICD-10-CM | POA: Diagnosis present

## 2015-05-05 DIAGNOSIS — K7460 Unspecified cirrhosis of liver: Secondary | ICD-10-CM | POA: Diagnosis present

## 2015-05-05 DIAGNOSIS — R52 Pain, unspecified: Secondary | ICD-10-CM

## 2015-05-05 DIAGNOSIS — R748 Abnormal levels of other serum enzymes: Secondary | ICD-10-CM | POA: Diagnosis present

## 2015-05-05 DIAGNOSIS — Z7982 Long term (current) use of aspirin: Secondary | ICD-10-CM

## 2015-05-05 DIAGNOSIS — J449 Chronic obstructive pulmonary disease, unspecified: Secondary | ICD-10-CM

## 2015-05-05 DIAGNOSIS — F0390 Unspecified dementia without behavioral disturbance: Secondary | ICD-10-CM | POA: Diagnosis present

## 2015-05-05 LAB — COMPREHENSIVE METABOLIC PANEL
ALK PHOS: 79 U/L (ref 38–126)
ALT: 19 U/L (ref 14–54)
AST: 36 U/L (ref 15–41)
Albumin: 3.2 g/dL — ABNORMAL LOW (ref 3.5–5.0)
Anion gap: 15 (ref 5–15)
BUN: 41 mg/dL — AB (ref 6–20)
CALCIUM: 8.9 mg/dL (ref 8.9–10.3)
CHLORIDE: 94 mmol/L — AB (ref 101–111)
CO2: 23 mmol/L (ref 22–32)
CREATININE: 4.57 mg/dL — AB (ref 0.44–1.00)
GFR calc Af Amer: 11 mL/min — ABNORMAL LOW (ref >60)
GFR, EST NON AFRICAN AMERICAN: 9 mL/min — AB (ref >60)
Glucose, Bld: 179 mg/dL — ABNORMAL HIGH (ref 65–99)
Potassium: 5.3 mmol/L — ABNORMAL HIGH (ref 3.5–5.1)
Sodium: 132 mmol/L — ABNORMAL LOW (ref 135–145)
Total Bilirubin: 1.1 mg/dL (ref 0.3–1.2)
Total Protein: 7 g/dL (ref 6.5–8.1)

## 2015-05-05 LAB — GRAM STAIN: Special Requests: NORMAL

## 2015-05-05 LAB — URINALYSIS, ROUTINE W REFLEX MICROSCOPIC
Glucose, UA: 100 mg/dL — AB
KETONES UR: 15 mg/dL — AB
LEUKOCYTES UA: NEGATIVE
Nitrite: NEGATIVE
PH: 5 (ref 5.0–8.0)
Protein, ur: >300 mg/dL — AB
Specific Gravity, Urine: 1.025 (ref 1.005–1.030)
Urobilinogen, UA: 1 mg/dL (ref 0.0–1.0)

## 2015-05-05 LAB — CBC WITH DIFFERENTIAL/PLATELET
BASOS ABS: 0 10*3/uL (ref 0.0–0.1)
Basophils Relative: 0 % (ref 0–1)
EOS PCT: 1 % (ref 0–5)
Eosinophils Absolute: 0.1 10*3/uL (ref 0.0–0.7)
HEMATOCRIT: 35.1 % — AB (ref 36.0–46.0)
HEMOGLOBIN: 10.9 g/dL — AB (ref 12.0–15.0)
LYMPHS ABS: 0.8 10*3/uL (ref 0.7–4.0)
Lymphocytes Relative: 11 % — ABNORMAL LOW (ref 12–46)
MCH: 30 pg (ref 26.0–34.0)
MCHC: 31.1 g/dL (ref 30.0–36.0)
MCV: 96.7 fL (ref 78.0–100.0)
Monocytes Absolute: 0.4 10*3/uL (ref 0.1–1.0)
Monocytes Relative: 6 % (ref 3–12)
NEUTROS ABS: 5.7 10*3/uL (ref 1.7–7.7)
NEUTROS PCT: 82 % — AB (ref 43–77)
PLATELETS: 201 10*3/uL (ref 150–400)
RBC: 3.63 MIL/uL — AB (ref 3.87–5.11)
RDW: 18.5 % — ABNORMAL HIGH (ref 11.5–15.5)
WBC: 7 10*3/uL (ref 4.0–10.5)

## 2015-05-05 LAB — URINE MICROSCOPIC-ADD ON

## 2015-05-05 LAB — LIPASE, BLOOD: LIPASE: 24 U/L (ref 22–51)

## 2015-05-05 LAB — BRAIN NATRIURETIC PEPTIDE: B Natriuretic Peptide: >4500 pg/mL — ABNORMAL HIGH (ref 0.0–100.0)

## 2015-05-05 LAB — TROPONIN I
Troponin I: 0.06 ng/mL — ABNORMAL HIGH (ref <0.031)
Troponin I: 0.07 ng/mL — ABNORMAL HIGH (ref <0.031)

## 2015-05-05 MED ORDER — SODIUM CHLORIDE 0.9 % IV BOLUS (SEPSIS)
500.0000 mL | Freq: Once | INTRAVENOUS | Status: AC
  Administered 2015-05-05: 500 mL via INTRAVENOUS

## 2015-05-05 MED ORDER — IOHEXOL 300 MG/ML  SOLN
100.0000 mL | Freq: Once | INTRAMUSCULAR | Status: AC | PRN
  Administered 2015-05-05: 100 mL via INTRAVENOUS

## 2015-05-05 MED ORDER — IOHEXOL 300 MG/ML  SOLN: 25.0000 mL | Freq: Once | INTRAMUSCULAR | Status: DC | PRN

## 2015-05-05 NOTE — ED Notes (Signed)
Pt complaining of abd pain and nausea x4 days. Pt went to urgent care 4 days ago and was diagnosed with UTI- prescribed and antibiotic. Pt has been taking it regularly but still feeling abd pain- and it seems to have worsened. EMS reports BP 110/62, sinus rhythm with PVCs.

## 2015-05-05 NOTE — ED Notes (Signed)
Pt finished contrast, calling CT

## 2015-05-05 NOTE — ED Provider Notes (Signed)
CSN: [id number]     Arrival date & time 05/05/15  1501 History   First MD Initiated Contact with Patient 05/05/15 1506     Chief Complaint  Patient presents with  . Abdominal Pain   Patient is a 64 y.o. female presenting with general illness. The history is provided by the patient and medical records. No language interpreter was used.  Illness Location:  Abdomen Quality:  Pain Severity:  Moderate Onset quality:  Gradual Timing:  Constant Progression:  Worsening Chronicity:  Recurrent Context:  ESRD (TTS dilaysis), HTN, DM, COPD (albuterol prn, no O2 at home), CHF (25-30% July 2016), and ICM presenting today with abdominal pain & N/V 4 days. Generalized but worst in lower quadrants. Gradual onset. Progressive worsening. Nausea constant. Emesis predominantly post-tussive with small streaks of BRB. Associated symptoms include constipation, dysuria, and urinary frequency/urgency. Patient also notes baseline SOB & cough associated w/ orthopnea which is unchanged from baseline. Associated symptoms: abdominal pain, chest pain (baseline, unchanged), cough (baseline, unchanged), nausea, shortness of breath (baseline, unchanged) and vomiting (NBNB)   Associated symptoms: no diarrhea and no fever     Past Medical History  Diagnosis Date  . Chronic kidney disease   . Diabetes mellitus without complication   . Hypertension   . COPD (chronic obstructive pulmonary disease)   . Ischemic cardiomyopathy   . Anemia   . Family history of anesthesia complication     MOTHER HAD NAUSEA  . CHF (congestive heart failure)     CHRONIC SYSTOLIC  . Shortness of breath    Past Surgical History  Procedure Laterality Date  . Knee cartilage surgery Left 2007  . Video assisted thoracoscopy (vats)/ lymph node sampling Left 2007  . Incontinence surgery  ?2006  . Coronary artery bypass graft  2010    x 1   History reviewed. No pertinent family history. Social History  Substance Use Topics  . Smoking status:  Former Smoker -- 80 years    Quit date: 08/30/2008  . Smokeless tobacco: Never Used  . Alcohol Use: No   OB History    No data available      Review of Systems  Constitutional: Negative for fever and chills.  Respiratory: Positive for cough (baseline, unchanged) and shortness of breath (baseline, unchanged).   Cardiovascular: Positive for chest pain (baseline, unchanged) and leg swelling (baseline, unchanged). Negative for palpitations.  Gastrointestinal: Positive for nausea, vomiting (NBNB), abdominal pain, constipation and blood in stool (streaking on toilet paper, baseline, unchanged). Negative for diarrhea and rectal pain.  Genitourinary: Positive for dysuria, urgency and frequency. Negative for hematuria.  All other systems reviewed and are negative.   Allergies  Statins; Levaquin; Metformin and related; Other; Sulfa antibiotics; and Tobradex  Home Medications   Prior to Admission medications   Medication Sig Start Date End Date Taking? Authorizing Provider  ARIPiprazole (ABILIFY) 5 MG tablet Take 5 mg by mouth daily.   Yes Historical Provider, MD  aspirin 81 MG tablet Take 81 mg by mouth daily.   Yes Historical Provider, MD  b complex-vitamin c-folic acid (NEPHRO-VITE) 0.8 MG TABS tablet Take 1 tablet by mouth Every Tuesday,Thursday,and Saturday with dialysis.    Yes Historical Provider, MD  budesonide-formoterol (SYMBICORT) 160-4.5 MCG/ACT inhaler Inhale 2 puffs into the lungs 2 (two) times daily.   Yes Historical Provider, MD  calcitRIOL (ROCALTROL) 0.5 MCG capsule Take 2 capsules (1 mcg total) by mouth Every Tuesday,Thursday,and Saturday with dialysis. 03/07/15  Yes Catarina Hartshorn, MD  calcium acetate [facility])  667 MG tablet Take 1 tablet by mouth 3 (three) times daily with meals. 03/07/15  Yes Catarina Hartshorn, MD  cephALEXin (KEFLEX) 500 MG capsule Take 500 mg by mouth 2 (two) times daily. 05/03/15  Yes Historical Provider, MD  citalopram (CELEXA) 40 MG tablet Take 40 mg by mouth at bedtime.   04/29/15  Yes Historical Provider, MD  clopidogrel (PLAVIX) 75 MG tablet Take 75 mg by mouth daily. 04/21/15  Yes Historical Provider, MD  docusate sodium (COLACE) 100 MG capsule Take 100 mg by mouth 2 (two) times daily.    Yes Historical Provider, MD  fluconazole (DIFLUCAN) 100 MG tablet Take 100 mg by mouth daily. 05/03/15  Yes Historical Provider, MD  glycerin adult (GLYCERIN ADULT) 2 G SUPP Place 1 suppository rectally daily as needed for moderate constipation.   Yes Historical Provider, MD  ipratropium-albuterol (DUONEB) 0.5-2.5 (3) MG/3ML SOLN Take 3 mLs by nebulization 2 (two) times daily as needed (shortness of breath).    Yes Historical Provider, MD  LORazepam (ATIVAN) 0.5 MG tablet Take 1 tablet (0.5 mg total) by mouth 2 (two) times daily as needed for anxiety. Patient taking differently: Take 0.5 mg by mouth 3 (three) times daily as needed for anxiety.  03/07/15  Yes Catarina Hartshorn, MD  nitroGLYCERIN (NITROSTAT) 0.4 MG SL tablet Place 0.4 mg under the tongue every 5 (five) minutes as needed for chest pain.   Yes Historical Provider, MD  omeprazole (PRILOSEC) 40 MG capsule Take 40 mg by mouth daily.   Yes Historical Provider, MD  ondansetron (ZOFRAN) 4 MG tablet Take 4 mg by mouth. 04/28/15  Yes Historical Provider, MD  oxyCODONE-acetaminophen (PERCOCET/ROXICET) 5-325 MG per tablet Take 1 tablet by mouth every 4 (four) hours as needed for moderate pain. pain 02/24/15  Yes Historical Provider, MD  Senna-Fennel (NATURAL VEGETABLE LAXATIVE PO) Take 1 tablet by mouth 2 (two) times daily.   Yes Historical Provider, MD   BP 103/59 mmHg  Pulse 74  Temp(Src) 98.3 F (36.8 C) (Oral)  Resp 15  Ht  (1.651 m)  Wt 147 lb (66.679 kg)  BMI 24.46 kg/m2  SpO2 99%   Physical Exam  Constitutional: She is oriented to person, place, and time. She appears well-developed and well-nourished. No distress.  HENT:  Head: Normocephalic and atraumatic.  Eyes: Conjunctivae are normal. Pupils are equal, round, and  reactive to light.  Neck: Normal range of motion. Neck supple.  Cardiovascular: Normal rate, regular rhythm and intact distal pulses.   Pulmonary/Chest: Effort normal and breath sounds normal.  Saturations 93% on room air  Abdominal: Soft. Bowel sounds are normal. She exhibits no distension. There is tenderness (throughout, worse in lower quadrants). There is no rebound and no guarding.  Musculoskeletal: Normal range of motion.  Neurological: She is alert and oriented to person, place, and time.  Skin: Skin is warm and dry. She is not diaphoretic.    ED Course  Procedures   Labs Review Labs Reviewed  CBC WITH DIFFERENTIAL/PLATELET - Abnormal; Notable for the following:    RBC 3.63 (*)    Hemoglobin 10.9 (*)    HCT 35.1 (*)    RDW 18.5 (*)    Neutrophils Relative % 82 (*)    Lymphocytes Relative 11 (*)    All other components within normal limits  COMPREHENSIVE METABOLIC PANEL - Abnormal; Notable for the following:    Sodium 132 (*)    Potassium 5.3 (*)    Chloride 94 (*)    Glucose,  Bld 179 (*)    BUN 41 (*)    Creatinine, Ser 4.57 (*)    Albumin 3.2 (*)    GFR calc non Af Amer 9 (*)    GFR calc Af Amer 11 (*)    All other components within normal limits  URINALYSIS, ROUTINE W REFLEX MICROSCOPIC (NOT AT [facility]) - Abnormal; Notable for the following:    Color, Urine AMBER (*)    Glucose, UA 100 (*)    Hgb urine dipstick SMALL (*)    Bilirubin Urine MODERATE (*)    Ketones, ur 15 (*)    Protein, ur >300 (*)    All other components within normal limits  BRAIN NATRIURETIC PEPTIDE - Abnormal; Notable for the following:    B Natriuretic Peptide >4500.0 (*)    All other components within normal limits  TROPONIN I - Abnormal; Notable for the following:    Troponin I 0.06 (*)    All other components within normal limits  URINE MICROSCOPIC-ADD ON - Abnormal; Notable for the following:    Squamous Epithelial / LPF FEW (*)    Bacteria, UA FEW (*)    Casts GRANULAR CAST (*)     All other components within normal limits  TROPONIN I - Abnormal; Notable for the following:    Troponin I 0.07 (*)    All other components within normal limits  GRAM STAIN  LIPASE, BLOOD    Imaging Review Ct Abdomen Pelvis W Contrast  05/05/2015   CLINICAL DATA:  64 year old female with mid abdominal pain and emesis.  EXAM: CT ABDOMEN AND PELVIS WITH CONTRAST  TECHNIQUE: Multidetector CT imaging of the abdomen and pelvis was performed using the standard protocol following bolus administration of intravenous contrast.  CONTRAST:  OMNIPAQUE IOHEXOL 300 MG/ML  SOLN  COMPARISON:  CT dated 03/07/2013  FINDINGS: Evaluation of the pelvic structures is limited due to streak artifact caused by right femoral metallic hardware.  Small left pleural effusion. The visualized lung bases are clear with there is cardiomegaly with dilatation of the left ventricle. Retrograde flow of contrast noted within the IVC indicative of right cardiac dysfunction. Coronary vascular calcification and CABG clips noted. No intra-abdominal free air. There is a small free fluid within the pelvis.  There is mild irregularity of the hepatic contour concerning for underlying cirrhosis. Clinical correlation is recommended. The gallbladder wall appears thickened and edematous concerning for acute cholecystitis. There is a small pericholecystic fluid. Small calcified stone may be present at the neck of the gallbladder or within the cystic duct. Ultrasound is recommended for better evaluation of the gallbladder. The pancreas splenic and adrenal glands appear unremarkable. There is no hydronephrosis on either side. The visualized ureters and urinary bladder appear unremarkable. The uterus is visualized but not well evaluated.  constipation. There is no evidence of bowel obstruction. There is mild diffuse thickening of the wall of the distal and terminal ileum, likely related to chronic inflammation. There is luminal narrowing of a loop of  distal small bowel within the pelvis likely related to chronic inflammation and stricture. Correlation with history of Crohn's disease recommended. The appendix is not visualized with certainty.  There is aortoiliac atherosclerotic disease. The origins of the celiac axis, SMA, IMA as well as the origins of the renal arteries appear patent. There is focal narrowing of the origin of the celiac axis with poststenotic dilatation of the vessel measuring up to 9 mm. There is no lymphadenopathy. There is lumbar scoliosis with degenerative changes  of the spine. There is angulation and S1-S2, likely related to an old fracture. There is disc desiccation and vacuum phenomena at L4-5. Multilevel diffuse disc bulge most prominent at L3-L4 and L5-S1. No acute fracture. Right femoral orthopedic hardware noted. There is diffuse subcutaneous soft tissue stranding and edema. Small fat containing umbilical hernia noted.  IMPRESSION: Inflammatory changes of the gallbladder most compatible with acute cholecystitis. Ultrasound recommended for better evaluation of the gallbladder.  Irregular hepatic contour concerning for cirrhosis. Clinical correlation is recommended.  Segmental thickening of the distal and terminal ileum with areas of strictures, likely related to chronic inflammation. Correlation with history of inflammatory bowel disease such as Crohn's recommended. No bowel obstruction.   Electronically Signed   By: Elgie Collard M.D.   On: 05/05/2015 21:24   Dg Chest Port 1 View  05/05/2015   CLINICAL DATA:  Acute shortness of breath today.  EXAM: PORTABLE CHEST - 1 VIEW  COMPARISON:  03/13/2015 and prior chest radiographs dating back to 11/11/2013  FINDINGS: Cardiomegaly and CABG changes again noted.  Surgical changes overlying the left upper lung again noted.  There is no evidence of focal airspace disease, pulmonary edema, suspicious pulmonary nodule/mass, pleural effusion, or pneumothorax. No acute bony abnormalities are  identified.  IMPRESSION: Cardiomegaly without evidence of acute cardiopulmonary disease.   Electronically Signed   By: Harmon Pier M.D.   On: 05/05/2015 15:59   US Abdomen Limited Ruq  05/05/2015   CLINICAL DATA:  Abdominal pain and nausea. Symptoms for 4 days. History of diabetes and hypertension, COPD, CHF, chronic kidney disease, dialysis.  EXAM: US ABDOMEN LIMITED - RIGHT UPPER QUADRANT  COMPARISON:  CT abdomen and pelvis 05/05/2015  FINDINGS: Gallbladder:  Small stones demonstrated in the gallbladder. Stone versus small polyp in the gallbladder neck. No gallbladder distention. Mild borderline gallbladder wall thickening with trace fluid between the liver and gallbladder. Murphy's sign is negative.  Common bile duct:  Diameter: 4.2 mm, normal  Liver:  Diffusely increased parenchymal echotexture with nodular contour suggesting possible cirrhosis.  IMPRESSION: Small stones in the gallbladder with borderline wall thickening and small amount of edema. Murphy's sign is negative. Findings are indeterminate but suggestive of cholecystitis. Heterogeneous liver parenchymal echotexture with nodular contour suggest possible cirrhosis.   Electronically Signed   By: Burman Nieves M.D.   On: 05/05/2015 23:17   I have personally reviewed and evaluated these images and lab results as part of my medical decision-making.   EKG Interpretation None      MDM   Ms. Balazs is a 64 yo female with PMHx of ESRD (TTS dilaysis), HTN, DM, COPD (albuterol prn, no O2 at home), CHF (25-30% July 2016), and ICM presenting today with abdominal pain & N/V 4 days. Generalized but worst in lower quadrants. Gradual onset. Progressive worsening. Nausea constant. Emesis predominantly post-tussive with small streaks of BRB. Associated symptoms include constipation, dysuria, and urinary frequency/urgency. Patient seen at urgent care 3 days ago at which time she was diagnosed with a UTI and placed on Keflex which she has been taking as  prescribed. Patient also notes baseline SOB & cough associated w/ orthopnea which is unchanged from baseline. Patient also notes exertional chest pressure radiating to left arm & neck relieved with rest -  experiences this on a weekly basis, no recent worsening. Has not been taking NO because it burns her tongue gives her a HA. Denies CP currently.   Exam above notable for pleasant elderly female lying in stretcher in  no acute distress. Afebrile. Not tachycardic. Not tachypneic. Breathing well on room air and maintaining saturations at 93% without supplemental oxygen. Normotensive. Abdomen showing generalized tenderness to palpation worse in lower quadrants without guarding or rebound. No CVA tenderness.  EKG showing no ST elevation/depression and T-wave inversions in V5/V6 which appear unchanged from previous. First troponin 0.6. Second troponin 0.7 Chest x-ray notable for cardiomegaly without evidence of acute cardiopulmonary disease. UA negative for infection. Hemoglobin 10.9 which is patient's baseline. WBC 7.0. CMP showing normal AST/ALT, normal alkaline phosphatase, normal T bili. Lipase normal. BMP within 4500 (patient has advanced CHF with EF of 25-30% as well as ESRD).   CT scan results concerning for cholecystitis. Report to ultrasound obtained and also showing same concern for cholecystitis. General surgery was consultative and informed about condition of patient and recommends admission to hospitalists and will discuss possible procedural interventions tomorrow with patient. Hospitalist consultation and evaluated patient in the emergency department and will admit for further evaluation and management. Patient and family understand and agree with the plan and has no further questions or concerns time.  Patient care discussed with and followed by my attending, Dr. Dione Booze   Final diagnoses:  Abdominal pain  Cholecystitis    Angelina Ok, MD 05/06/15 1308  Dione Booze, MD 05/06/15  2253

## 2015-05-06 ENCOUNTER — Inpatient Hospital Stay (HOSPITAL_COMMUNITY): Payer: Medicare Other

## 2015-05-06 DIAGNOSIS — N390 Urinary tract infection, site not specified: Secondary | ICD-10-CM | POA: Diagnosis present

## 2015-05-06 DIAGNOSIS — I25708 Atherosclerosis of coronary artery bypass graft(s), unspecified, with other forms of angina pectoris: Secondary | ICD-10-CM | POA: Diagnosis not present

## 2015-05-06 DIAGNOSIS — K8000 Calculus of gallbladder with acute cholecystitis without obstruction: Secondary | ICD-10-CM | POA: Diagnosis present

## 2015-05-06 DIAGNOSIS — Z951 Presence of aortocoronary bypass graft: Secondary | ICD-10-CM | POA: Diagnosis not present

## 2015-05-06 DIAGNOSIS — Z992 Dependence on renal dialysis: Secondary | ICD-10-CM | POA: Diagnosis not present

## 2015-05-06 DIAGNOSIS — I739 Peripheral vascular disease, unspecified: Secondary | ICD-10-CM | POA: Diagnosis present

## 2015-05-06 DIAGNOSIS — Z7902 Long term (current) use of antithrombotics/antiplatelets: Secondary | ICD-10-CM | POA: Diagnosis not present

## 2015-05-06 DIAGNOSIS — E119 Type 2 diabetes mellitus without complications: Secondary | ICD-10-CM | POA: Diagnosis not present

## 2015-05-06 DIAGNOSIS — N2581 Secondary hyperparathyroidism of renal origin: Secondary | ICD-10-CM | POA: Diagnosis present

## 2015-05-06 DIAGNOSIS — I5022 Chronic systolic (congestive) heart failure: Secondary | ICD-10-CM | POA: Diagnosis not present

## 2015-05-06 DIAGNOSIS — F0390 Unspecified dementia without behavioral disturbance: Secondary | ICD-10-CM | POA: Diagnosis present

## 2015-05-06 DIAGNOSIS — Z882 Allergy status to sulfonamides status: Secondary | ICD-10-CM | POA: Diagnosis not present

## 2015-05-06 DIAGNOSIS — J449 Chronic obstructive pulmonary disease, unspecified: Secondary | ICD-10-CM | POA: Diagnosis present

## 2015-05-06 DIAGNOSIS — Z79899 Other long term (current) drug therapy: Secondary | ICD-10-CM | POA: Diagnosis not present

## 2015-05-06 DIAGNOSIS — Z87891 Personal history of nicotine dependence: Secondary | ICD-10-CM | POA: Diagnosis not present

## 2015-05-06 DIAGNOSIS — K819 Cholecystitis, unspecified: Secondary | ICD-10-CM | POA: Diagnosis present

## 2015-05-06 DIAGNOSIS — E1122 Type 2 diabetes mellitus with diabetic chronic kidney disease: Secondary | ICD-10-CM | POA: Diagnosis present

## 2015-05-06 DIAGNOSIS — I5023 Acute on chronic systolic (congestive) heart failure: Secondary | ICD-10-CM | POA: Diagnosis present

## 2015-05-06 DIAGNOSIS — K8050 Calculus of bile duct without cholangitis or cholecystitis without obstruction: Secondary | ICD-10-CM | POA: Diagnosis not present

## 2015-05-06 DIAGNOSIS — K8001 Calculus of gallbladder with acute cholecystitis with obstruction: Secondary | ICD-10-CM | POA: Diagnosis present

## 2015-05-06 DIAGNOSIS — Z79891 Long term (current) use of opiate analgesic: Secondary | ICD-10-CM | POA: Diagnosis not present

## 2015-05-06 DIAGNOSIS — I255 Ischemic cardiomyopathy: Secondary | ICD-10-CM | POA: Diagnosis present

## 2015-05-06 DIAGNOSIS — I120 Hypertensive chronic kidney disease with stage 5 chronic kidney disease or end stage renal disease: Secondary | ICD-10-CM | POA: Diagnosis present

## 2015-05-06 DIAGNOSIS — Z888 Allergy status to other drugs, medicaments and biological substances status: Secondary | ICD-10-CM | POA: Diagnosis not present

## 2015-05-06 DIAGNOSIS — N186 End stage renal disease: Secondary | ICD-10-CM | POA: Diagnosis not present

## 2015-05-06 DIAGNOSIS — R748 Abnormal levels of other serum enzymes: Secondary | ICD-10-CM | POA: Diagnosis present

## 2015-05-06 DIAGNOSIS — D649 Anemia, unspecified: Secondary | ICD-10-CM | POA: Diagnosis present

## 2015-05-06 DIAGNOSIS — K7460 Unspecified cirrhosis of liver: Secondary | ICD-10-CM | POA: Diagnosis present

## 2015-05-06 DIAGNOSIS — Z7982 Long term (current) use of aspirin: Secondary | ICD-10-CM | POA: Diagnosis not present

## 2015-05-06 DIAGNOSIS — I25119 Atherosclerotic heart disease of native coronary artery with unspecified angina pectoris: Secondary | ICD-10-CM | POA: Diagnosis present

## 2015-05-06 DIAGNOSIS — Z6824 Body mass index (BMI) 24.0-24.9, adult: Secondary | ICD-10-CM | POA: Diagnosis not present

## 2015-05-06 LAB — CBC
HCT: 33.7 % — ABNORMAL LOW (ref 36.0–46.0)
Hemoglobin: 10.4 g/dL — ABNORMAL LOW (ref 12.0–15.0)
MCH: 29.5 pg (ref 26.0–34.0)
MCHC: 30.9 g/dL (ref 30.0–36.0)
MCV: 95.5 fL (ref 78.0–100.0)
PLATELETS: 198 10*3/uL (ref 150–400)
RBC: 3.53 MIL/uL — AB (ref 3.87–5.11)
RDW: 18.5 % — ABNORMAL HIGH (ref 11.5–15.5)
WBC: 6.6 10*3/uL (ref 4.0–10.5)

## 2015-05-06 LAB — COMPREHENSIVE METABOLIC PANEL
ALBUMIN: 3.2 g/dL — AB (ref 3.5–5.0)
ALT: 26 U/L (ref 14–54)
AST: 44 U/L — AB (ref 15–41)
Alkaline Phosphatase: 72 U/L (ref 38–126)
Anion gap: 13 (ref 5–15)
BUN: 47 mg/dL — AB (ref 6–20)
CHLORIDE: 97 mmol/L — AB (ref 101–111)
CO2: 23 mmol/L (ref 22–32)
CREATININE: 5 mg/dL — AB (ref 0.44–1.00)
Calcium: 8.9 mg/dL (ref 8.9–10.3)
GFR calc Af Amer: 10 mL/min — ABNORMAL LOW (ref >60)
GFR, EST NON AFRICAN AMERICAN: 8 mL/min — AB (ref >60)
GLUCOSE: 78 mg/dL (ref 65–99)
POTASSIUM: 5 mmol/L (ref 3.5–5.1)
SODIUM: 133 mmol/L — AB (ref 135–145)
Total Bilirubin: 1.2 mg/dL (ref 0.3–1.2)
Total Protein: 6.7 g/dL (ref 6.5–8.1)

## 2015-05-06 LAB — GLUCOSE, CAPILLARY
GLUCOSE-CAPILLARY: 94 mg/dL (ref 65–99)
GLUCOSE-CAPILLARY: 118 mg/dL — AB (ref 65–99)
Glucose-Capillary: 163 mg/dL — ABNORMAL HIGH (ref 65–99)
Glucose-Capillary: 61 mg/dL — ABNORMAL LOW (ref 65–99)
Glucose-Capillary: 152 mg/dL — ABNORMAL HIGH (ref 65–99)

## 2015-05-06 MED ORDER — HEPARIN SODIUM (PORCINE) 5000 UNIT/ML IJ SOLN
5000.0000 [IU] | Freq: Three times a day (TID) | INTRAMUSCULAR | Status: DC
  Administered 2015-05-06 – 2015-05-09 (×9): 5000 [IU] via SUBCUTANEOUS
  Filled 2015-05-06 (×8): qty 1

## 2015-05-06 MED ORDER — ONDANSETRON HCL 4 MG/2ML IJ SOLN
4.0000 mg | Freq: Four times a day (QID) | INTRAMUSCULAR | Status: DC | PRN
  Administered 2015-05-06 – 2015-05-08 (×2): 4 mg via INTRAVENOUS
  Filled 2015-05-06 (×2): qty 2

## 2015-05-06 MED ORDER — IPRATROPIUM-ALBUTEROL 0.5-2.5 (3) MG/3ML IN SOLN: 3.0000 mL | Freq: Two times a day (BID) | RESPIRATORY_TRACT | Status: DC | PRN

## 2015-05-06 MED ORDER — FLUCONAZOLE 100 MG PO TABS
100.0000 mg | ORAL_TABLET | Freq: Every day | ORAL | Status: DC
  Administered 2015-05-07 – 2015-05-08 (×2): 100 mg via ORAL
  Filled 2015-05-06 (×3): qty 1

## 2015-05-06 MED ORDER — OXYCODONE-ACETAMINOPHEN 5-325 MG PO TABS
ORAL_TABLET | ORAL | Status: AC
  Filled 2015-05-06: qty 1

## 2015-05-06 MED ORDER — CALCITRIOL 0.5 MCG PO CAPS
1.0000 ug | ORAL_CAPSULE | ORAL | Status: DC
  Administered 2015-05-06 – 2015-05-08 (×2): 1 ug via ORAL
  Filled 2015-05-06 (×3): qty 2

## 2015-05-06 MED ORDER — NITROGLYCERIN 0.4 MG SL SUBL
0.4000 mg | SUBLINGUAL_TABLET | SUBLINGUAL | Status: DC | PRN
Start: 2015-05-06 — End: 2015-05-09

## 2015-05-06 MED ORDER — DEXTROSE 50 % IV SOLN: 25.0000 mL | Freq: Once | INTRAVENOUS | Status: AC

## 2015-05-06 MED ORDER — LIDOCAINE-PRILOCAINE 2.5-2.5 % EX CREA: 1.0000 "application " | TOPICAL_CREAM | CUTANEOUS | Status: DC | PRN

## 2015-05-06 MED ORDER — HEPARIN SODIUM (PORCINE) 1000 UNIT/ML DIALYSIS: 1000.0000 [IU] | INTRAMUSCULAR | Status: DC | PRN

## 2015-05-06 MED ORDER — SODIUM CHLORIDE 0.9 % IV SOLN
1.5000 g | Freq: Two times a day (BID) | INTRAVENOUS | Status: DC
  Administered 2015-05-06 (×3): 1.5 g via INTRAVENOUS
  Filled 2015-05-06 (×5): qty 1.5

## 2015-05-06 MED ORDER — BUDESONIDE-FORMOTEROL FUMARATE 160-4.5 MCG/ACT IN AERO
2.0000 | INHALATION_SPRAY | Freq: Two times a day (BID) | RESPIRATORY_TRACT | Status: DC
  Administered 2015-05-07 – 2015-05-09 (×3): 2 via RESPIRATORY_TRACT
  Filled 2015-05-06 (×2): qty 6

## 2015-05-06 MED ORDER — ALTEPLASE 2 MG IJ SOLR: 2.0000 mg | Freq: Once | INTRAMUSCULAR | Status: DC | PRN

## 2015-05-06 MED ORDER — PENTAFLUOROPROP-TETRAFLUOROETH EX AERO: 1.0000 "application " | INHALATION_SPRAY | CUTANEOUS | Status: DC | PRN

## 2015-05-06 MED ORDER — LORAZEPAM 0.5 MG PO TABS
0.5000 mg | ORAL_TABLET | Freq: Three times a day (TID) | ORAL | Status: DC | PRN
  Administered 2015-05-06 – 2015-05-09 (×4): 0.5 mg via ORAL
  Filled 2015-05-06 (×4): qty 1

## 2015-05-06 MED ORDER — DEXTROSE 50 % IV SOLN
INTRAVENOUS | Status: AC
  Administered 2015-05-06: 25 mL
  Filled 2015-05-06: qty 50

## 2015-05-06 MED ORDER — SODIUM CHLORIDE 0.9 % IV SOLN: 100.0000 mL | INTRAVENOUS | Status: DC | PRN

## 2015-05-06 MED ORDER — ASPIRIN EC 81 MG PO TBEC
81.0000 mg | DELAYED_RELEASE_TABLET | Freq: Every day | ORAL | Status: DC
  Administered 2015-05-06 – 2015-05-09 (×4): 81 mg via ORAL
  Filled 2015-05-06 (×7): qty 1

## 2015-05-06 MED ORDER — CLOPIDOGREL BISULFATE 75 MG PO TABS
75.0000 mg | ORAL_TABLET | Freq: Every day | ORAL | Status: DC
  Administered 2015-05-06: 75 mg via ORAL
  Filled 2015-05-06: qty 1

## 2015-05-06 MED ORDER — DOCUSATE SODIUM 100 MG PO CAPS
100.0000 mg | ORAL_CAPSULE | Freq: Two times a day (BID) | ORAL | Status: DC
  Administered 2015-05-06 – 2015-05-09 (×8): 100 mg via ORAL
  Filled 2015-05-06 (×8): qty 1

## 2015-05-06 MED ORDER — ARIPIPRAZOLE 10 MG PO TABS
5.0000 mg | ORAL_TABLET | Freq: Every day | ORAL | Status: DC
  Administered 2015-05-06 – 2015-05-09 (×4): 5 mg via ORAL
  Filled 2015-05-06 (×5): qty 1

## 2015-05-06 MED ORDER — CITALOPRAM HYDROBROMIDE 20 MG PO TABS
40.0000 mg | ORAL_TABLET | Freq: Every day | ORAL | Status: DC
  Administered 2015-05-06 – 2015-05-09 (×4): 40 mg via ORAL
  Filled 2015-05-06 (×4): qty 2

## 2015-05-06 MED ORDER — CALCIUM ACETATE (PHOS BINDER) 667 MG PO CAPS
667.0000 mg | ORAL_CAPSULE | Freq: Three times a day (TID) | ORAL | Status: DC
  Administered 2015-05-06 – 2015-05-09 (×9): 667 mg via ORAL
  Filled 2015-05-06 (×13): qty 1

## 2015-05-06 MED ORDER — OXYCODONE-ACETAMINOPHEN 5-325 MG PO TABS
1.0000 | ORAL_TABLET | ORAL | Status: DC | PRN
  Administered 2015-05-06 – 2015-05-09 (×6): 1 via ORAL
  Filled 2015-05-06 (×4): qty 1

## 2015-05-06 MED ORDER — PANTOPRAZOLE SODIUM 40 MG PO TBEC
80.0000 mg | DELAYED_RELEASE_TABLET | Freq: Every day | ORAL | Status: DC
  Administered 2015-05-06 – 2015-05-09 (×4): 80 mg via ORAL
  Filled 2015-05-06 (×4): qty 2

## 2015-05-06 MED ORDER — TECHNETIUM TC 99M MEBROFENIN IV KIT
5.1000 | PACK | Freq: Once | INTRAVENOUS | Status: DC | PRN
  Administered 2015-05-06: 5 via INTRAVENOUS
  Filled 2015-05-06: qty 6

## 2015-05-06 MED ORDER — DIPHENHYDRAMINE-ZINC ACETATE 2-0.1 % EX CREA
TOPICAL_CREAM | Freq: Three times a day (TID) | CUTANEOUS | Status: DC | PRN
  Filled 2015-05-06: qty 28

## 2015-05-06 MED ORDER — LIDOCAINE HCL (PF) 1 % IJ SOLN: 5.0000 mL | INTRAMUSCULAR | Status: DC | PRN

## 2015-05-06 NOTE — Progress Notes (Signed)
Patient ID: Kathleen [name], female   DOB: 01-02-1951, 64 y.o.   MRN: [id number]    Subjective: Pt was getting HIDA scan but was uncomfortable and had some nausea.  Test stopped about half way though.  Objective: Vital signs in last 24 hours: Temp:  [97.6 F (36.4 C)-98.3 F (36.8 C)] 97.7 F (36.5 C) (09/06 6387) Pulse Rate:  [45-78] 76 (09/06 0638) Resp:  [10-21] 16 (09/06 0638) BP: (95-138)/(49-94) 127/66 mmHg (09/06 0638) SpO2:  [92 %-100 %] 100 % (09/06 5643) Weight:  [63.957 kg (141 lb)-66.8 kg (147 lb 4.3 oz)] 66.7 kg (147 lb 0.8 oz) (09/06 0146) Last BM Date: 05/05/15  Intake/Output from previous day:   Intake/Output this shift:    PE: Abd: soft, minimally tender, few BS, ND   Lab Results:   Recent Labs  05/05/15 1520 05/06/15 0532  WBC 7.0 6.6  HGB 10.9* 10.4*  HCT 35.1* 33.7*  PLT 201 198   BMET  Recent Labs  05/05/15 1520 05/06/15 0532  NA 132* 133*  K 5.3* 5.0  CL 94* 97*  CO2 23 23  GLUCOSE 179* 78  BUN 41* 47*  CREATININE 4.57* 5.00*  CALCIUM 8.9 8.9   PT/INR No results for input(s): LABPROT, INR in the last 72 hours. CMP     Component Value Date/Time   NA 133* 05/06/2015 0532   K 5.0 05/06/2015 0532   CL 97* 05/06/2015 0532   CO2 23 05/06/2015 0532   GLUCOSE 78 05/06/2015 0532   BUN 47* 05/06/2015 0532   CREATININE 5.00* 05/06/2015 0532   CALCIUM 8.9 05/06/2015 0532   PROT 6.7 05/06/2015 0532   ALBUMIN 3.2* 05/06/2015 0532   AST 44* 05/06/2015 0532   ALT 26 05/06/2015 0532   ALKPHOS 72 05/06/2015 0532   BILITOT 1.2 05/06/2015 0532   GFRNONAA 8* 05/06/2015 0532   GFRAA 10* 05/06/2015 0532   Lipase     Component Value Date/Time   LIPASE 24 05/05/2015 1520       Studies/Results: Ct Abdomen Pelvis W Contrast  05/05/2015   CLINICAL DATA:  64 year old female with mid abdominal pain and emesis.  EXAM: CT ABDOMEN AND PELVIS WITH CONTRAST  TECHNIQUE: Multidetector CT imaging of the abdomen and pelvis was performed using the  standard protocol following bolus administration of intravenous contrast.  CONTRAST:  OMNIPAQUE IOHEXOL 300 MG/ML  SOLN  COMPARISON:  CT dated 03/07/2013  FINDINGS: Evaluation of the pelvic structures is limited due to streak artifact caused by right femoral metallic hardware.  Small left pleural effusion. The visualized lung bases are clear with there is cardiomegaly with dilatation of the left ventricle. Retrograde flow of contrast noted within the IVC indicative of right cardiac dysfunction. Coronary vascular calcification and CABG clips noted. No intra-abdominal free air. There is a small free fluid within the pelvis.  There is mild irregularity of the hepatic contour concerning for underlying cirrhosis. Clinical correlation is recommended. The gallbladder wall appears thickened and edematous concerning for acute cholecystitis. There is a small pericholecystic fluid. Small calcified stone may be present at the neck of the gallbladder or within the cystic duct. Ultrasound is recommended for better evaluation of the gallbladder. The pancreas splenic and adrenal glands appear unremarkable. There is no hydronephrosis on either side. The visualized ureters and urinary bladder appear unremarkable. The uterus is visualized but not well evaluated.  constipation. There is no evidence of bowel obstruction. There is mild diffuse thickening of the wall of the distal and terminal  ileum, likely related to chronic inflammation. There is luminal narrowing of a loop of distal small bowel within the pelvis likely related to chronic inflammation and stricture. Correlation with history of Crohn's disease recommended. The appendix is not visualized with certainty.  There is aortoiliac atherosclerotic disease. The origins of the celiac axis, SMA, IMA as well as the origins of the renal arteries appear patent. There is focal narrowing of the origin of the celiac axis with poststenotic dilatation of the vessel measuring up to 9  mm. There is no lymphadenopathy. There is lumbar scoliosis with degenerative changes of the spine. There is angulation and S1-S2, likely related to an old fracture. There is disc desiccation and vacuum phenomena at L4-5. Multilevel diffuse disc bulge most prominent at L3-L4 and L5-S1. No acute fracture. Right femoral orthopedic hardware noted. There is diffuse subcutaneous soft tissue stranding and edema. Small fat containing umbilical hernia noted.  IMPRESSION: Inflammatory changes of the gallbladder most compatible with acute cholecystitis. Ultrasound recommended for better evaluation of the gallbladder.  Irregular hepatic contour concerning for cirrhosis. Clinical correlation is recommended.  Segmental thickening of the distal and terminal ileum with areas of strictures, likely related to chronic inflammation. Correlation with history of inflammatory bowel disease such as Crohn's recommended. No bowel obstruction.   Electronically Signed   By: Elgie Collard M.D.   On: 05/05/2015 21:24   Dg Chest Port 1 View  05/05/2015   CLINICAL DATA:  Acute shortness of breath today.  EXAM: PORTABLE CHEST - 1 VIEW  COMPARISON:  03/13/2015 and prior chest radiographs dating back to 11/11/2013  FINDINGS: Cardiomegaly and CABG changes again noted.  Surgical changes overlying the left upper lung again noted.  There is no evidence of focal airspace disease, pulmonary edema, suspicious pulmonary nodule/mass, pleural effusion, or pneumothorax. No acute bony abnormalities are identified.  IMPRESSION: Cardiomegaly without evidence of acute cardiopulmonary disease.   Electronically Signed   By: Harmon Pier M.D.   On: 05/05/2015 15:59   US Abdomen Limited Ruq  05/05/2015   CLINICAL DATA:  Abdominal pain and nausea. Symptoms for 4 days. History of diabetes and hypertension, COPD, CHF, chronic kidney disease, dialysis.  EXAM: US ABDOMEN LIMITED - RIGHT UPPER QUADRANT  COMPARISON:  CT abdomen and pelvis 05/05/2015  FINDINGS:  Gallbladder:  Small stones demonstrated in the gallbladder. Stone versus small polyp in the gallbladder neck. No gallbladder distention. Mild borderline gallbladder wall thickening with trace fluid between the liver and gallbladder. Murphy's sign is negative.  Common bile duct:  Diameter: 4.2 mm, normal  Liver:  Diffusely increased parenchymal echotexture with nodular contour suggesting possible cirrhosis.  IMPRESSION: Small stones in the gallbladder with borderline wall thickening and small amount of edema. Murphy's sign is negative. Findings are indeterminate but suggestive of cholecystitis. Heterogeneous liver parenchymal echotexture with nodular contour suggest possible cirrhosis.   Electronically Signed   By: Burman Nieves M.D.   On: 05/05/2015 23:17    Anti-infectives: Anti-infectives    Start     Dose/Rate Route Frequency Ordered Stop   05/06/15 1000  fluconazole (DIFLUCAN) tablet 100 mg     100 mg Oral Daily 05/06/15 0008     05/06/15 0130  ampicillin-sulbactam (UNASYN) 1.5 g in sodium chloride 0.9 % 50 mL IVPB     1.5 g 100 mL/hr over 30 Minutes Intravenous 2 times daily 05/06/15 0053         Assessment/Plan  1. Abdominal pain  -US shows some gallstones with borderline wall thickening.  She also has findings c/w possible cirrhosis as well -HIDA scan was partially completed, but at 35 minutes it appears the gallbladder is visible, which would suggest no evidence of cholecystitis.  We will await the final read to make any further recommendations, but doubtful she will need an operation this admission. -may have clear liquids from our standpoint -may have some biliary colic or may have primary liver dysfunction as source of her pain.  LFTS are normal 2. ESRD -per primary service 3. MMP -per primary service  LOS: 0 days    Lylia Karn E 05/06/2015, 10:03 AM Pager: [phone]

## 2015-05-06 NOTE — Progress Notes (Signed)
Hypoglycemic Event  CBG: 61  Treatment: D50 IV 25 mL  Symptoms: None  Follow-up CBG: [id number] CBG Result:163  Possible Reasons for Event: Inadequate meal intake: NPO for Hepatobiliary scan      Kathleen [name]  Remember to initiate Hypoglycemia Order Set & complete

## 2015-05-06 NOTE — Consult Note (Signed)
Reason for Consult: Cholecystitis Referring Physician: Dr. Ninfa Linden Kathleen [name] is an 64 y.o. female.  HPI: Patient is a 64 year old female with multiple medical issues, who comes in today secondary to a two-week history of abdominal pain. Patient states she's had associated nausea and vomiting. She states the pain has been on and off over the last 2 weeks. She does state that eating makes the pain worse. Patient came to the ER for further evaluation secondary to ongoing pain.  Upon evaluation ER patient underwent ultrasound and CT scan which revealed a nodular liver with question of cirrhosis. There also appeared to be a thickened gallbladder wall and pericholecystic fluid as well as ascitic fluid within the abdomen. There was a small gallstone within the neck of the gallbladder.  Surgery was consult for further management.  Past Medical History  Diagnosis Date  . Chronic kidney disease   . Diabetes mellitus without complication   . Hypertension   . COPD (chronic obstructive pulmonary disease)   . Ischemic cardiomyopathy   . Anemia   . Family history of anesthesia complication     MOTHER HAD NAUSEA  . CHF (congestive heart failure)     CHRONIC SYSTOLIC  . Shortness of breath     Past Surgical History  Procedure Laterality Date  . Knee cartilage surgery Left 2007  . Video assisted thoracoscopy (vats)/ lymph node sampling Left 2007  . Incontinence surgery  ?2006  . Coronary artery bypass graft  2010    x 1    History reviewed. No pertinent family history.  Social History:  reports that she quit smoking about 6 years ago. She has never used smokeless tobacco. She reports that she does not drink alcohol or use illicit drugs.  Allergies:  Allergies  Allergen Reactions  . Statins     Side effects of leg pain and aching with "cholesterol medications", pt requested that no statins be given to her from this time forward  . Levaquin [Levofloxacin In D5w] Hives  . Metformin And  Related Diarrhea  . Other Other (See Comments)    Mycins allergy, Resource [employer] feeding supplement causes high blood sugar   . Sulfa Antibiotics Hives  . Tobradex [Tobramycin-Dexamethasone] Other (See Comments)    redness    Medications: I have reviewed the patient's current medications.  Results for orders placed or performed during the hospital encounter of 05/05/15 (from the past 48 hour(s))  CBC with Differential     Status: Abnormal   Collection Time: 05/05/15  3:20 PM  Result Value Ref Range   WBC 7.0 4.0 - 10.5 K/uL   RBC 3.63 (L) 3.87 - 5.11 MIL/uL   Hemoglobin 10.9 (L) 12.0 - 15.0 g/dL   HCT 35.1 (L) 36.0 - 46.0 %   MCV 96.7 78.0 - 100.0 fL   MCH 30.0 26.0 - 34.0 pg   MCHC 31.1 30.0 - 36.0 g/dL   RDW 18.5 (H) 11.5 - 15.5 %   Platelets 201 150 - 400 K/uL   Neutrophils Relative % 82 (H) 43 - 77 %   Neutro Abs 5.7 1.7 - 7.7 K/uL   Lymphocytes Relative 11 (L) 12 - 46 %   Lymphs Abs 0.8 0.7 - 4.0 K/uL   Monocytes Relative 6 3 - 12 %   Monocytes Absolute 0.4 0.1 - 1.0 K/uL   Eosinophils Relative 1 0 - 5 %   Eosinophils Absolute 0.1 0.0 - 0.7 K/uL   Basophils Relative 0 0 - 1 %  Basophils Absolute 0.0 0.0 - 0.1 K/uL  Comprehensive metabolic panel     Status: Abnormal   Collection Time: 05/05/15  3:20 PM  Result Value Ref Range   Sodium 132 (L) 135 - 145 mmol/L   Potassium 5.3 (H) 3.5 - 5.1 mmol/L   Chloride 94 (L) 101 - 111 mmol/L   CO2 23 22 - 32 mmol/L   Glucose, Bld 179 (H) 65 - 99 mg/dL   BUN 41 (H) 6 - 20 mg/dL   Creatinine, Ser 4.57 (H) 0.44 - 1.00 mg/dL   Calcium 8.9 8.9 - 10.3 mg/dL   Total Protein 7.0 6.5 - 8.1 g/dL   Albumin 3.2 (L) 3.5 - 5.0 g/dL   AST 36 15 - 41 U/L   ALT 19 14 - 54 U/L   Alkaline Phosphatase 79 38 - 126 U/L   Total Bilirubin 1.1 0.3 - 1.2 mg/dL   GFR calc non Af Amer 9 (L) >60 mL/min   GFR calc Af Amer 11 (L) >60 mL/min    Comment: (NOTE) The eGFR has been calculated using the CKD EPI equation. This calculation has not been  validated in all clinical situations. eGFR's persistently <60 mL/min signify possible Chronic Kidney Disease.    Anion gap 15 5 - 15  Lipase, blood     Status: None   Collection Time: 05/05/15  3:20 PM  Result Value Ref Range   Lipase 24 22 - 51 U/L  Brain natriuretic peptide     Status: Abnormal   Collection Time: 05/05/15  3:20 PM  Result Value Ref Range   B Natriuretic Peptide >4500.0 (H) 0.0 - 100.0 pg/mL  Troponin I     Status: Abnormal   Collection Time: 05/05/15  3:20 PM  Result Value Ref Range   Troponin I 0.06 (H) <0.031 ng/mL    Comment:        PERSISTENTLY INCREASED TROPONIN VALUES IN THE RANGE OF 0.04-0.49 ng/mL CAN BE SEEN IN:       -UNSTABLE ANGINA       -CONGESTIVE HEART FAILURE       -MYOCARDITIS       -CHEST TRAUMA       -ARRYHTHMIAS       -LATE PRESENTING MYOCARDIAL INFARCTION       -COPD   CLINICAL FOLLOW-UP RECOMMENDED.   Urinalysis, Routine w reflex microscopic (not at [facility])     Status: Abnormal   Collection Time: 05/05/15  3:41 PM  Result Value Ref Range   Color, Urine AMBER (A) YELLOW    Comment: BIOCHEMICALS MAY BE AFFECTED BY COLOR   APPearance CLEAR CLEAR   Specific Gravity, Urine 1.025 1.005 - 1.030   pH 5.0 5.0 - 8.0   Glucose, UA 100 (A) NEGATIVE mg/dL   Hgb urine dipstick SMALL (A) NEGATIVE   Bilirubin Urine MODERATE (A) NEGATIVE   Ketones, ur 15 (A) NEGATIVE mg/dL   Protein, ur >300 (A) NEGATIVE mg/dL   Urobilinogen, UA 1.0 0.0 - 1.0 mg/dL   Nitrite NEGATIVE NEGATIVE   Leukocytes, UA NEGATIVE NEGATIVE  Gram stain     Status: None   Collection Time: 05/05/15  3:41 PM  Result Value Ref Range   Specimen Description URINE, CATHETERIZED    Special Requests Normal    Gram Stain      WBC PRESENT, PREDOMINANTLY MONONUCLEAR NO ORGANISMS SEEN SQUAMOUS EPITHELIAL CELLS PRESENT CYTOSPIN    Report Status 05/05/2015 FINAL   Urine microscopic-add on     Status: Abnormal  Collection Time: 05/05/15  3:41 PM  Result Value Ref Range   Squamous  Epithelial / LPF FEW (A) RARE   WBC, UA 0-2 <3 WBC/hpf   RBC / HPF 3-6 <3 RBC/hpf   Bacteria, UA FEW (A) RARE   Casts GRANULAR CAST (A) NEGATIVE    Comment: HYALINE CASTS  Troponin I     Status: Abnormal   Collection Time: 05/05/15  9:49 PM  Result Value Ref Range   Troponin I 0.07 (H) <0.031 ng/mL    Comment:        PERSISTENTLY INCREASED TROPONIN VALUES IN THE RANGE OF 0.04-0.49 ng/mL CAN BE SEEN IN:       -UNSTABLE ANGINA       -CONGESTIVE HEART FAILURE       -MYOCARDITIS       -CHEST TRAUMA       -ARRYHTHMIAS       -LATE PRESENTING MYOCARDIAL INFARCTION       -COPD   CLINICAL FOLLOW-UP RECOMMENDED.     Ct Abdomen Pelvis W Contrast  05/05/2015   CLINICAL DATA:  64 year old female with mid abdominal pain and emesis.  EXAM: CT ABDOMEN AND PELVIS WITH CONTRAST  TECHNIQUE: Multidetector CT imaging of the abdomen and pelvis was performed using the standard protocol following bolus administration of intravenous contrast.  CONTRAST:  110m OMNIPAQUE IOHEXOL 300 MG/ML  SOLN  COMPARISON:  CT dated 03/07/2013  FINDINGS: Evaluation of the pelvic structures is limited due to streak artifact caused by right femoral metallic hardware.  Small left pleural effusion. The visualized lung bases are clear with there is cardiomegaly with dilatation of the left ventricle. Retrograde flow of contrast noted within the IVC indicative of right cardiac dysfunction. Coronary vascular calcification and CABG clips noted. No intra-abdominal free air. There is a small free fluid within the pelvis.  There is mild irregularity of the hepatic contour concerning for underlying cirrhosis. Clinical correlation is recommended. The gallbladder wall appears thickened and edematous concerning for acute cholecystitis. There is a small pericholecystic fluid. Small calcified stone may be present at the neck of the gallbladder or within the cystic duct. Ultrasound is recommended for better evaluation of the gallbladder. The pancreas  splenic and adrenal glands appear unremarkable. There is no hydronephrosis on either side. The visualized ureters and urinary bladder appear unremarkable. The uterus is visualized but not well evaluated.  constipation. There is no evidence of bowel obstruction. There is mild diffuse thickening of the wall of the distal and terminal ileum, likely related to chronic inflammation. There is luminal narrowing of a loop of distal small bowel within the pelvis likely related to chronic inflammation and stricture. Correlation with history of Crohn's disease recommended. The appendix is not visualized with certainty.  There is aortoiliac atherosclerotic disease. The origins of the celiac axis, SMA, IMA as well as the origins of the renal arteries appear patent. There is focal narrowing of the origin of the celiac axis with poststenotic dilatation of the vessel measuring up to 9 mm. There is no lymphadenopathy. There is lumbar scoliosis with degenerative changes of the spine. There is angulation and S1-S2, likely related to an old fracture. There is disc desiccation and vacuum phenomena at L4-5. Multilevel diffuse disc bulge most prominent at L3-L4 and L5-S1. No acute fracture. Right femoral orthopedic hardware noted. There is diffuse subcutaneous soft tissue stranding and edema. Small fat containing umbilical hernia noted.  IMPRESSION: Inflammatory changes of the gallbladder most compatible with acute cholecystitis. Ultrasound recommended for better  evaluation of the gallbladder.  Irregular hepatic contour concerning for cirrhosis. Clinical correlation is recommended.  Segmental thickening of the distal and terminal ileum with areas of strictures, likely related to chronic inflammation. Correlation with history of inflammatory bowel disease such as Crohn's recommended. No bowel obstruction.   Electronically Signed   By: Anner Crete M.D.   On: 05/05/2015 21:24   Dg Chest Port 1 View  05/05/2015   CLINICAL DATA:  Acute  shortness of breath today.  EXAM: PORTABLE CHEST - 1 VIEW  COMPARISON:  03/13/2015 and prior chest radiographs dating back to 11/11/2013  FINDINGS: Cardiomegaly and CABG changes again noted.  Surgical changes overlying the left upper lung again noted.  There is no evidence of focal airspace disease, pulmonary edema, suspicious pulmonary nodule/mass, pleural effusion, or pneumothorax. No acute bony abnormalities are identified.  IMPRESSION: Cardiomegaly without evidence of acute cardiopulmonary disease.   Electronically Signed   By: Margarette Canada M.D.   On: 05/05/2015 15:59   US Abdomen Limited Ruq  05/05/2015   CLINICAL DATA:  Abdominal pain and nausea. Symptoms for 4 days. History of diabetes and hypertension, COPD, CHF, chronic kidney disease, dialysis.  EXAM: US ABDOMEN LIMITED - RIGHT UPPER QUADRANT  COMPARISON:  CT abdomen and pelvis 05/05/2015  FINDINGS: Gallbladder:  Small stones demonstrated in the gallbladder. Stone versus small polyp in the gallbladder neck. No gallbladder distention. Mild borderline gallbladder wall thickening with trace fluid between the liver and gallbladder. Murphy's sign is negative.  Common bile duct:  Diameter: 4.2 mm, normal  Liver:  Diffusely increased parenchymal echotexture with nodular contour suggesting possible cirrhosis.  IMPRESSION: Small stones in the gallbladder with borderline wall thickening and small amount of edema. Murphy's sign is negative. Findings are indeterminate but suggestive of cholecystitis. Heterogeneous liver parenchymal echotexture with nodular contour suggest possible cirrhosis.   Electronically Signed   By: Lucienne Capers M.D.   On: 05/05/2015 23:17    Review of Systems  Constitutional: Negative.   HENT: Negative.   Eyes: Negative.   Respiratory: Negative.   Cardiovascular: Negative.   Gastrointestinal: Positive for nausea, vomiting and abdominal pain. Negative for diarrhea and constipation.  Genitourinary: Negative.   Musculoskeletal:  Negative.   Skin: Negative.   Neurological: Negative.    Blood pressure 138/64, pulse 75, temperature 98.3 F (36.8 C), temperature source Oral, resp. rate 19, height _0  (1.651 m), weight 66.679 kg (147 lb), SpO2 99 %. Physical Exam  Constitutional: She is oriented to person, place, and time. She appears well-developed and well-nourished.  HENT:  Head: Normocephalic and atraumatic.  Eyes: Conjunctivae and EOM are normal. Pupils are equal, round, and reactive to light.  Neck: Normal range of motion. Neck supple.  Cardiovascular: Normal rate, regular rhythm and normal heart sounds.   Respiratory: Effort normal and breath sounds normal.  GI: Soft. Bowel sounds are normal. She exhibits no distension. There is tenderness (Generalized but greatest in the right upper quadrant). There is no rebound and no guarding.  Musculoskeletal: Normal range of motion.  Neurological: She is alert and oriented to person, place, and time.  Skin: Skin is warm and dry.    Assessment/Plan: 64 year old female with possible cholecystitis/cholelithiasis with a history of chronic kidney disease on hemodialysis, CHF, hypertension, COPD   1. Would recommend at this time a HIDA scan to evaluate for acute cholecystitis. 2. Continue nothing by mouth, IV fluids 3. This would not recommend antibiotics as patient does not have a leukocytosis.   Rosario Jacks.,  Lewie Deman 05/06/2015, 1:12 AM

## 2015-05-06 NOTE — Progress Notes (Signed)
Patient seen and examined, currently pain free ,reported has intermittent right upper quadrant pain for years, more frequent recently, lap chole per surgery, nephrology for HD support. H/o CHF ef 25% volume managed by HD.

## 2015-05-06 NOTE — Consult Note (Signed)
[facility] Renal Consultation Note    Indication for Consultation:  Management of ESRD/hemodialysis; anemia, hypertension/volume and secondary hyperparathyroidism  HPI: Kathleen [name] is a 64 y.o. female with ESRD on TTS dialysis in [place], EF 25050% who presented with abdominal pain with N and V for the past few days.  Work up showed acute calculous cholecystitis on CT and [place] without obstruction. General surgery was consulted. Pt received a partial HIDA scan that was stopped prematurely due to nausea. She is afebrile without ^ in WBC.  She regularly attends HD and usually runs her full treatment with average Uf volumes of 1-2.3 L. Saturday she had a net UF of 1.1 with post weight of 65.9 (EDW ^^) post HD BP was 124/72/  She has mild SOB, notes more facial puffiness lately. No CP. She is due for HD today.  Past Medical History  Diagnosis Date  . Chronic kidney disease   . Diabetes mellitus without complication   . Hypertension   . COPD (chronic obstructive pulmonary disease)   . Ischemic cardiomyopathy   . Anemia   . Family history of anesthesia complication     MOTHER HAD NAUSEA  . CHF (congestive heart failure)     CHRONIC SYSTOLIC  . Shortness of breath    Past Surgical History  Procedure Laterality Date  . Knee cartilage surgery Left 2007  . Video assisted thoracoscopy (vats)/ lymph node sampling Left 2007  . Incontinence surgery  ?2006  . Coronary artery bypass graft  2010    x 1   History reviewed. No pertinent family history. Social History:  reports that she quit smoking about 6 years ago. She has never used smokeless tobacco. She reports that she does not drink alcohol or use illicit drugs. Allergies  Allergen Reactions  . Statins     Side effects of leg pain and aching with "cholesterol medications", pt requested that no statins be given to her from this time forward  . Levaquin [Levofloxacin In D5w] Hives  . Metformin And Related Diarrhea  . Other  Other (See Comments)    Mycins allergy, Resource Breeze feeding supplement causes high blood sugar   . Sulfa Antibiotics Hives  . Tobradex [Tobramycin-Dexamethasone] Other (See Comments)    redness   Prior to Admission medications   Medication Sig Start Date End Date Taking? Authorizing Provider  ARIPiprazole (ABILIFY) 5 MG tablet Take 5 mg by mouth daily.   Yes Historical Provider, MD  aspirin 81 MG tablet Take 81 mg by mouth daily.   Yes Historical Provider, MD  b complex-vitamin c-folic acid (NEPHRO-VITE) 0.8 MG TABS tablet Take 1 tablet by mouth Every Tuesday,Thursday,and Saturday with dialysis.    Yes Historical Provider, MD  budesonide-formoterol (SYMBICORT) 160-4.5 MCG/ACT inhaler Inhale 2 puffs into the lungs 2 (two) times daily.   Yes Historical Provider, MD  calcitRIOL (ROCALTROL) 0.5 MCG capsule Take 2 capsules (1 mcg total) by mouth Every Tuesday,Thursday,and Saturday with dialysis. 03/07/15  Yes Catarina Hartshorn, MD  calcium acetate (PHOSLO) 667 MG tablet Take 1 tablet by mouth 3 (three) times daily with meals. 03/07/15  Yes Catarina Hartshorn, MD  cephALEXin (KEFLEX) 500 MG capsule Take 500 mg by mouth 2 (two) times daily. 05/03/15  Yes Historical Provider, MD  citalopram (CELEXA) 40 MG tablet Take 40 mg by mouth at bedtime.  04/29/15  Yes Historical Provider, MD  clopidogrel (PLAVIX) 75 MG tablet Take 75 mg by mouth daily. 04/21/15  Yes Historical Provider, MD  docusate sodium (  COLACE) 100 MG capsule Take 100 mg by mouth 2 (two) times daily.    Yes Historical Provider, MD  fluconazole (DIFLUCAN) 100 MG tablet Take 100 mg by mouth daily. 05/03/15  Yes Historical Provider, MD  glycerin adult (GLYCERIN ADULT) 2 G SUPP Place 1 suppository rectally daily as needed for moderate constipation.   Yes Historical Provider, MD  ipratropium-albuterol (DUONEB) 0.5-2.5 (3) MG/3ML SOLN Take 3 mLs by nebulization 2 (two) times daily as needed (shortness of breath).    Yes Historical Provider, MD  LORazepam (ATIVAN) 0.5 MG  tablet Take 1 tablet (0.5 mg total) by mouth 2 (two) times daily as needed for anxiety. Patient taking differently: Take 0.5 mg by mouth 3 (three) times daily as needed for anxiety.  03/07/15  Yes Catarina Hartshorn, MD  nitroGLYCERIN (NITROSTAT) 0.4 MG SL tablet Place 0.4 mg under the tongue every 5 (five) minutes as needed for chest pain.   Yes Historical Provider, MD  omeprazole (PRILOSEC) 40 MG capsule Take 40 mg by mouth daily.   Yes Historical Provider, MD  ondansetron (ZOFRAN) 4 MG tablet Take 4 mg by mouth. 04/28/15  Yes Historical Provider, MD  oxyCODONE-acetaminophen (PERCOCET/ROXICET) 5-325 MG per tablet Take 1 tablet by mouth every 4 (four) hours as needed for moderate pain. pain 02/24/15  Yes Historical Provider, MD  Senna-Fennel (NATURAL VEGETABLE LAXATIVE PO) Take 1 tablet by mouth 2 (two) times daily.   Yes Historical Provider, MD   Current Facility-Administered Medications  Medication Dose Route Frequency Provider Last Rate Last Dose  . ampicillin-sulbactam (UNASYN) 1.5 g in sodium chloride 0.9 % 50 mL IVPB  1.5 g Intravenous BID Hillary Bow, DO   1.5 g at 05/06/15 1100  . ARIPiprazole (ABILIFY) tablet 5 mg  5 mg Oral Daily Hillary Bow, DO      . aspirin EC tablet 81 mg  81 mg Oral Daily Hillary Bow, DO   81 mg at 05/06/15 1051  . budesonide-formoterol (SYMBICORT) 160-4.5 MCG/ACT inhaler 2 puff  2 puff Inhalation BID Hillary Bow, DO   2 puff at 05/06/15 0030  . calcitRIOL (ROCALTROL) capsule 1 mcg  1 mcg Oral Q T,Th,Sa-HD Hillary Bow, DO      . calcium acetate (PHOSLO) capsule 667 mg  667 mg Oral TID WC Hillary Bow, DO   667 mg at 05/06/15 0800  . citalopram (CELEXA) tablet 40 mg  40 mg Oral QHS Hillary Bow, DO   40 mg at 05/06/15 0154  . clopidogrel (PLAVIX) tablet 75 mg  75 mg Oral Daily Hillary Bow, DO   75 mg at 05/06/15 1051  . docusate sodium (COLACE) capsule 100 mg  100 mg Oral BID Hillary Bow, DO   100 mg at 05/06/15 1051  . fluconazole (DIFLUCAN)  tablet 100 mg  100 mg Oral Daily Hillary Bow, DO   100 mg at 05/06/15 1000  . heparin injection 5,000 Units  5,000 Units Subcutaneous 3 times per day Hillary Bow, DO   5,000 Units at 05/06/15 0155  . iohexol (OMNIPAQUE) 300 MG/ML solution 25 mL  25 mL Oral Once PRN Medication Radiologist, MD      . ipratropium-albuterol (DUONEB) 0.5-2.5 (3) MG/3ML nebulizer solution 3 mL  3 mL Nebulization BID PRN Hillary Bow, DO      . LORazepam (ATIVAN) tablet 0.5 mg  0.5 mg Oral TID PRN Hillary Bow, DO   0.5 mg at 05/06/15 0154  . nitroGLYCERIN (  NITROSTAT) SL tablet 0.4 mg  0.4 mg Sublingual Q5 min PRN Hillary Bow, DO      . ondansetron [facility]) injection 4 mg  4 mg Intravenous Q6H PRN Hillary Bow, DO      . oxyCODONE-acetaminophen (PERCOCET/ROXICET) 5-325 MG per tablet 1 tablet  1 tablet Oral Q4H PRN Hillary Bow, DO   1 tablet at 05/06/15 1100  . pantoprazole (PROTONIX) EC tablet 80 mg  80 mg Oral Daily Hillary Bow, DO   80 mg at 05/06/15 1051  . technetium TC 92M mebrofenin (CHOLETEC) injection 5 milli Curie  5 milli Curie Intravenous Once PRN Medication Radiologist, MD   5 milli Curie at 05/06/15 0840   Labs: Basic Metabolic Panel:  Recent Labs Lab 05/05/15 1520 05/06/15 0532  NA 132* 133*  K 5.3* 5.0  CL 94* 97*  CO2 23 23  GLUCOSE 179* 78  BUN 41* 47*  CREATININE 4.57* 5.00*  CALCIUM 8.9 8.9   Liver Function Tests:  Recent Labs Lab 05/05/15 1520 05/06/15 0532  AST 36 44*  ALT 19 26  ALKPHOS 79 72  BILITOT 1.1 1.2  PROT 7.0 6.7  ALBUMIN 3.2* 3.2*    Recent Labs Lab 05/05/15 1520  LIPASE 24   CBC:  Recent Labs Lab 05/05/15 1520 05/06/15 0532  WBC 7.0 6.6  NEUTROABS 5.7  --   HGB 10.9* 10.4*  HCT 35.1* 33.7*  MCV 96.7 95.5  PLT 201 198   Cardiac Enzymes:  Recent Labs Lab 05/05/15 1520 05/05/15 2149  TROPONINI 0.06* 0.07*   CBG:  Recent Labs Lab 05/06/15 0143 05/06/15 0749 05/06/15 0818 05/06/15 1150  GLUCAP 94 61* 163*  118*   Studies/Results: Nm Hepatobiliary Liver Func  05/06/2015   CLINICAL DATA:  Right upper quadrant pain with nausea and vomiting. Gallstones. Evaluate for acute cholecystitis.  EXAM: NUCLEAR MEDICINE HEPATOBILIARY IMAGING  TECHNIQUE: Sequential images of the abdomen were obtained out to 60 minutes following intravenous administration of radiopharmaceutical.  RADIOPHARMACEUTICALS:  5.1 mCi Tc-65m  Choletec IV  COMPARISON:  None.  FINDINGS: Prompt and homogeneous uptake into the liver. Prominent blood pool in this patient with suspected cirrhosis.  Central bile ducts seen by 25 min and the gallbladder by 30 min. No tracer in the bowel at 60 min, when patient terminated the examination.  IMPRESSION: 1. Patent cystic duct. 2. No bowel activity at 60 min, cannot confirm common bile duct patency. Patient terminated exam before delayed imaging could be acquired.   Electronically Signed   By: Marnee Spring M.D.   On: 05/06/2015 10:06   Ct Abdomen Pelvis W Contrast  05/05/2015   CLINICAL DATA:  64 year old female with mid abdominal pain and emesis.  EXAM: CT ABDOMEN AND PELVIS WITH CONTRAST  TECHNIQUE: Multidetector CT imaging of the abdomen and pelvis was performed using the standard protocol following bolus administration of intravenous contrast.  CONTRAST:  OMNIPAQUE IOHEXOL 300 MG/ML  SOLN  COMPARISON:  CT dated 03/07/2013  FINDINGS: Evaluation of the pelvic structures is limited due to streak artifact caused by right femoral metallic hardware.  Small left pleural effusion. The visualized lung bases are clear with there is cardiomegaly with dilatation of the left ventricle. Retrograde flow of contrast noted within the IVC indicative of right cardiac dysfunction. Coronary vascular calcification and CABG clips noted. No intra-abdominal free air. There is a small free fluid within the pelvis.  There is mild irregularity of the hepatic contour concerning for underlying cirrhosis. Clinical correlation is  recommended. The gallbladder wall appears thickened and edematous concerning for acute cholecystitis. There is a small pericholecystic fluid. Small calcified stone may be present at the neck of the gallbladder or within the cystic duct. Ultrasound is recommended for better evaluation of the gallbladder. The pancreas splenic and adrenal glands appear unremarkable. There is no hydronephrosis on either side. The visualized ureters and urinary bladder appear unremarkable. The uterus is visualized but not well evaluated.  constipation. There is no evidence of bowel obstruction. There is mild diffuse thickening of the wall of the distal and terminal ileum, likely related to chronic inflammation. There is luminal narrowing of a loop of distal small bowel within the pelvis likely related to chronic inflammation and stricture. Correlation with history of Crohn's disease recommended. The appendix is not visualized with certainty.  There is aortoiliac atherosclerotic disease. The origins of the celiac axis, SMA, IMA as well as the origins of the renal arteries appear patent. There is focal narrowing of the origin of the celiac axis with poststenotic dilatation of the vessel measuring up to 9 mm. There is no lymphadenopathy. There is lumbar scoliosis with degenerative changes of the spine. There is angulation and S1-S2, likely related to an old fracture. There is disc desiccation and vacuum phenomena at L4-5. Multilevel diffuse disc bulge most prominent at L3-L4 and L5-S1. No acute fracture. Right femoral orthopedic hardware noted. There is diffuse subcutaneous soft tissue stranding and edema. Small fat containing umbilical hernia noted.  IMPRESSION: Inflammatory changes of the gallbladder most compatible with acute cholecystitis. Ultrasound recommended for better evaluation of the gallbladder.  Irregular hepatic contour concerning for cirrhosis. Clinical correlation is recommended.  Segmental thickening of the distal and  terminal ileum with areas of strictures, likely related to chronic inflammation. Correlation with history of inflammatory bowel disease such as Crohn's recommended. No bowel obstruction.   Electronically Signed   By: Elgie Collard M.D.   On: 05/05/2015 21:24   Dg Chest Port 1 View  05/05/2015   CLINICAL DATA:  Acute shortness of breath today.  EXAM: PORTABLE CHEST - 1 VIEW  COMPARISON:  03/13/2015 and prior chest radiographs dating back to 11/11/2013  FINDINGS: Cardiomegaly and CABG changes again noted.  Surgical changes overlying the left upper lung again noted.  There is no evidence of focal airspace disease, pulmonary edema, suspicious pulmonary nodule/mass, pleural effusion, or pneumothorax. No acute bony abnormalities are identified.  IMPRESSION: Cardiomegaly without evidence of acute cardiopulmonary disease.   Electronically Signed   By: Harmon Pier M.D.   On: 05/05/2015 15:59   US Abdomen Limited Ruq  05/05/2015   CLINICAL DATA:  Abdominal pain and nausea. Symptoms for 4 days. History of diabetes and hypertension, COPD, CHF, chronic kidney disease, dialysis.  EXAM: US ABDOMEN LIMITED - RIGHT UPPER QUADRANT  COMPARISON:  CT abdomen and pelvis 05/05/2015  FINDINGS: Gallbladder:  Small stones demonstrated in the gallbladder. Stone versus small polyp in the gallbladder neck. No gallbladder distention. Mild borderline gallbladder wall thickening with trace fluid between the liver and gallbladder. Murphy's sign is negative.  Common bile duct:  Diameter: 4.2 mm, normal  Liver:  Diffusely increased parenchymal echotexture with nodular contour suggesting possible cirrhosis.  IMPRESSION: Small stones in the gallbladder with borderline wall thickening and small amount of edema. Murphy's sign is negative. Findings are indeterminate but suggestive of cholecystitis. Heterogeneous liver parenchymal echotexture with nodular contour suggest possible cirrhosis.   Electronically Signed   By: Burman Nieves M.D.   On:  05/05/2015 23:17  ROS: As per HPI otherwise negative. Physical Exam: Filed Vitals:   05/06/15 0139 05/06/15 0146 05/06/15 0638 05/06/15 1027  BP:  95/63 127/66 145/74  Pulse:  75 76 77  Temp:  97.6 F (36.4 C) 97.7 F (36.5 C) 97.5 F (36.4 C)  TempSrc:  Oral Oral Oral  Resp:  16 16 18   Height: 5\' 5"  (1.651 m) 5\' 5"  (1.651 m)    Weight: 66.8 kg (147 lb 4.3 oz) 66.7 kg (147 lb 0.8 oz)    SpO2:  100% 100% 100%     General:  WF NAD breathing easily on O2 Head: Normocephalic, atraumatic, sclera non-icteric, generalized facial edema Neck: Supple. JVD not elevated. Lungs: bilateral crackles Heart: RRR with S1 S2. Abdomen: Soft, mild upper quandranttenderness, non-distended + BS. Lower extremities: tr to + edema  Neuro: Alert and oriented X 3. Moves all extremities spontaneously. Psych:  Responds to questions appropriately with a normal affect. Dialysis Access:left upper AVF + bruit  Dialysis Orders: Center:[facility] 160 3.5 EDW 66 400/600 3 K 2.25 Ca pofile 4 Mircera 75 last 8/25 venofer 50 calcitriol 1 left upper AVF Recent labs:  Hgb 10.9 stable 14% sat ferritin 1237 Ca/P ok iPTH 562  Assessment/Plan: 1.  Abdominal pain - HIDA scan stopped due to nausea, no evidence of cholecystitis; showed gallstones with borderline thickening - may not need surgery- supportive care 2.  ESRD -  TTS HD today - on no heparin HD as outpt, unclear why 3.  Hypertension/volume  -BP variable - obvious excess volume on exam - try for 3 L may not be able to tolerate- has lost weight will need adjust in EDW down 4.  Anemia  - Hgb stable - continue weekly Fe - not due for ESA redose yet 5.  Metabolic bone disease -  Continue calcitriol 6.  Nutrition - diet advanced to CL  Sheffield Slider, PA-C [facility] Beeper [phone] 05/06/2015, 12:20 PM   Patient seen and examined, agree with above note with above modifications. Long term Hd patient from - is compliant.  Presents with  abdominal pain- w/up consistent with cholecystitis also due for dialysis today.  Will likely need EDW down this admission  Annie Sable, MD 05/06/2015

## 2015-05-06 NOTE — H&P (Signed)
[facility] History and Physical  Kathleen [name] ZOX:[id number] DOB: 1951-01-29 DOA: 05/05/2015  Referring physician: EDP PCP: No PCP Per Patient   Chief Complaint: Abdominal pain   HPI: Kathleen [name] is a 64 y.o. female with h/o ESRD dialysis TTS, CHF with EF 25-30% a couple of months ago on echo.  Patient presents to ED with c/o RUQ abdominal pain, N/V.  Symptoms ongoing for past 4 days.  Worsening despite trying keflex prescribed by UC.  Does have SOB, orthopnea, and cough which is unchanged from baseline.  Nausea is constant.  Vomiting is NBNB.  Review of Systems: Systems reviewed.  As above, otherwise negative  Past Medical History  Diagnosis Date  . Chronic kidney disease   . Diabetes mellitus without complication   . Hypertension   . COPD (chronic obstructive pulmonary disease)   . Ischemic cardiomyopathy   . Anemia   . Family history of anesthesia complication     MOTHER HAD NAUSEA  . CHF (congestive heart failure)     CHRONIC SYSTOLIC  . Shortness of breath    Past Surgical History  Procedure Laterality Date  . Knee cartilage surgery Left 2007  . Video assisted thoracoscopy (vats)/ lymph node sampling Left 2007  . Incontinence surgery  ?2006  . Coronary artery bypass graft  2010    x 1   Social History:  reports that she quit smoking about 6 years ago. She has never used smokeless tobacco. She reports that she does not drink alcohol or use illicit drugs.  Allergies  Allergen Reactions  . Statins     Side effects of leg pain and aching with "cholesterol medications", pt requested that no statins be given to her from this time forward  . Levaquin [Levofloxacin In D5w] Hives  . Metformin And Related Diarrhea  . Other Other (See Comments)    Mycins allergy, Resource Breeze feeding supplement causes high blood sugar   . Sulfa Antibiotics Hives  . Tobradex [Tobramycin-Dexamethasone] Other (See Comments)    redness    History reviewed. No pertinent family  history.   Prior to Admission medications   Medication Sig Start Date End Date Taking? Authorizing Provider  ARIPiprazole (ABILIFY) 5 MG tablet Take 5 mg by mouth daily.   Yes Historical Provider, MD  aspirin 81 MG tablet Take 81 mg by mouth daily.   Yes Historical Provider, MD  b complex-vitamin c-folic acid (NEPHRO-VITE) 0.8 MG TABS tablet Take 1 tablet by mouth Every Tuesday,Thursday,and Saturday with dialysis.    Yes Historical Provider, MD  budesonide-formoterol (SYMBICORT) 160-4.5 MCG/ACT inhaler Inhale 2 puffs into the lungs 2 (two) times daily.   Yes Historical Provider, MD  calcitRIOL (ROCALTROL) 0.5 MCG capsule Take 2 capsules (1 mcg total) by mouth Every Tuesday,Thursday,and Saturday with dialysis. 03/07/15  Yes Catarina Hartshorn, MD  calcium acetate (PHOSLO) 667 MG tablet Take 1 tablet by mouth 3 (three) times daily with meals. 03/07/15  Yes Catarina Hartshorn, MD  cephALEXin (KEFLEX) 500 MG capsule Take 500 mg by mouth 2 (two) times daily. 05/03/15  Yes Historical Provider, MD  citalopram (CELEXA) 40 MG tablet Take 40 mg by mouth at bedtime.  04/29/15  Yes Historical Provider, MD  clopidogrel (PLAVIX) 75 MG tablet Take 75 mg by mouth daily. 04/21/15  Yes Historical Provider, MD  docusate sodium (COLACE) 100 MG capsule Take 100 mg by mouth 2 (two) times daily.    Yes Historical Provider, MD  fluconazole (DIFLUCAN) 100 MG tablet Take 100 mg by  mouth daily. 05/03/15  Yes Historical Provider, MD  glycerin adult (GLYCERIN ADULT) 2 G SUPP Place 1 suppository rectally daily as needed for moderate constipation.   Yes Historical Provider, MD  ipratropium-albuterol (DUONEB) 0.5-2.5 (3) MG/3ML SOLN Take 3 mLs by nebulization 2 (two) times daily as needed (shortness of breath).    Yes Historical Provider, MD  LORazepam (ATIVAN) 0.5 MG tablet Take 1 tablet (0.5 mg total) by mouth 2 (two) times daily as needed for anxiety. Patient taking differently: Take 0.5 mg by mouth 3 (three) times daily as needed for anxiety.  03/07/15   Yes Catarina Hartshorn, MD  nitroGLYCERIN (NITROSTAT) 0.4 MG SL tablet Place 0.4 mg under the tongue every 5 (five) minutes as needed for chest pain.   Yes Historical Provider, MD  omeprazole (PRILOSEC) 40 MG capsule Take 40 mg by mouth daily.   Yes Historical Provider, MD  ondansetron (ZOFRAN) 4 MG tablet Take 4 mg by mouth. 04/28/15  Yes Historical Provider, MD  oxyCODONE-acetaminophen (PERCOCET/ROXICET) 5-325 MG per tablet Take 1 tablet by mouth every 4 (four) hours as needed for moderate pain. pain 02/24/15  Yes Historical Provider, MD  Senna-Fennel (NATURAL VEGETABLE LAXATIVE PO) Take 1 tablet by mouth 2 (two) times daily.   Yes Historical Provider, MD   Physical Exam: Filed Vitals:   05/06/15 0000  BP: 103/59  Pulse: 74  Temp:   Resp: 15    BP 103/59 mmHg  Pulse 74  Temp(Src) 98.3 F (36.8 C) (Oral)  Resp 15  Ht  (1.651 m)  Wt 66.679 kg (147 lb)  BMI 24.46 kg/m2  SpO2 99%  General Appearance:    Alert, oriented, no distress, appears stated age  Head:    Normocephalic, atraumatic  Eyes:    PERRL, EOMI, sclera non-icteric        Nose:   Nares without drainage or epistaxis. Mucosa, turbinates normal  Throat:   Moist mucous membranes. Oropharynx without erythema or exudate.  Neck:   Supple. No carotid bruits.  No thyromegaly.  No lymphadenopathy.   Back:     No CVA tenderness, no spinal tenderness  Lungs:     Clear to auscultation bilaterally, without wheezes, rhonchi or rales  Chest wall:    No tenderness to palpitation  Heart:    Regular rate and rhythm without murmurs, gallops, rubs  Abdomen:     Soft, non-tender, nondistended, normal bowel sounds, no organomegaly  Genitalia:    deferred  Rectal:    deferred  Extremities:   No clubbing, cyanosis or edema.  Pulses:   2+ and symmetric all extremities  Skin:   Skin color, texture, turgor normal, no rashes or lesions  Lymph nodes:   Cervical, supraclavicular, and axillary nodes normal  Neurologic:   CNII-XII intact. Normal  strength, sensation and reflexes      throughout    Labs on Admission:  Basic Metabolic Panel:  Recent Labs Lab 05/05/15 1520  NA 132*  K 5.3*  CL 94*  CO2 23  GLUCOSE 179*  BUN 41*  CREATININE 4.57*  CALCIUM 8.9   Liver Function Tests:  Recent Labs Lab 05/05/15 1520  AST 36  ALT 19  ALKPHOS 79  BILITOT 1.1  PROT 7.0  ALBUMIN 3.2*    Recent Labs Lab 05/05/15 1520  LIPASE 24   No results for input(s): AMMONIA in the last 168 hours. CBC:  Recent Labs Lab 05/05/15 1520  WBC 7.0  NEUTROABS 5.7  HGB 10.9*  HCT 35.1*  MCV 96.7  PLT 201   Cardiac Enzymes:  Recent Labs Lab 05/05/15 1520 05/05/15 2149  TROPONINI 0.06* 0.07*    BNP (last 3 results) No results for input(s): PROBNP in the last 8760 hours. CBG: No results for input(s): GLUCAP in the last 168 hours.  Radiological Exams on Admission: Ct Abdomen Pelvis W Contrast  05/05/2015   CLINICAL DATA:  64 year old female with mid abdominal pain and emesis.  EXAM: CT ABDOMEN AND PELVIS WITH CONTRAST  TECHNIQUE: Multidetector CT imaging of the abdomen and pelvis was performed using the standard protocol following bolus administration of intravenous contrast.  CONTRAST:  OMNIPAQUE IOHEXOL 300 MG/ML  SOLN  COMPARISON:  CT dated 03/07/2013  FINDINGS: Evaluation of the pelvic structures is limited due to streak artifact caused by right femoral metallic hardware.  Small left pleural effusion. The visualized lung bases are clear with there is cardiomegaly with dilatation of the left ventricle. Retrograde flow of contrast noted within the IVC indicative of right cardiac dysfunction. Coronary vascular calcification and CABG clips noted. No intra-abdominal free air. There is a small free fluid within the pelvis.  There is mild irregularity of the hepatic contour concerning for underlying cirrhosis. Clinical correlation is recommended. The gallbladder wall appears thickened and edematous concerning for acute  cholecystitis. There is a small pericholecystic fluid. Small calcified stone may be present at the neck of the gallbladder or within the cystic duct. Ultrasound is recommended for better evaluation of the gallbladder. The pancreas splenic and adrenal glands appear unremarkable. There is no hydronephrosis on either side. The visualized ureters and urinary bladder appear unremarkable. The uterus is visualized but not well evaluated.  constipation. There is no evidence of bowel obstruction. There is mild diffuse thickening of the wall of the distal and terminal ileum, likely related to chronic inflammation. There is luminal narrowing of a loop of distal small bowel within the pelvis likely related to chronic inflammation and stricture. Correlation with history of Crohn's disease recommended. The appendix is not visualized with certainty.  There is aortoiliac atherosclerotic disease. The origins of the celiac axis, SMA, IMA as well as the origins of the renal arteries appear patent. There is focal narrowing of the origin of the celiac axis with poststenotic dilatation of the vessel measuring up to 9 mm. There is no lymphadenopathy. There is lumbar scoliosis with degenerative changes of the spine. There is angulation and S1-S2, likely related to an old fracture. There is disc desiccation and vacuum phenomena at L4-5. Multilevel diffuse disc bulge most prominent at L3-L4 and L5-S1. No acute fracture. Right femoral orthopedic hardware noted. There is diffuse subcutaneous soft tissue stranding and edema. Small fat containing umbilical hernia noted.  IMPRESSION: Inflammatory changes of the gallbladder most compatible with acute cholecystitis. Ultrasound recommended for better evaluation of the gallbladder.  Irregular hepatic contour concerning for cirrhosis. Clinical correlation is recommended.  Segmental thickening of the distal and terminal ileum with areas of strictures, likely related to chronic inflammation. Correlation  with history of inflammatory bowel disease such as Crohn's recommended. No bowel obstruction.   Electronically Signed   By: Elgie Collard M.D.   On: 05/05/2015 21:24   Dg Chest Port 1 View  05/05/2015   CLINICAL DATA:  Acute shortness of breath today.  EXAM: PORTABLE CHEST - 1 VIEW  COMPARISON:  03/13/2015 and prior chest radiographs dating back to 11/11/2013  FINDINGS: Cardiomegaly and CABG changes again noted.  Surgical changes overlying the left upper lung again noted.  There  is no evidence of focal airspace disease, pulmonary edema, suspicious pulmonary nodule/mass, pleural effusion, or pneumothorax. No acute bony abnormalities are identified.  IMPRESSION: Cardiomegaly without evidence of acute cardiopulmonary disease.   Electronically Signed   By: Harmon Pier M.D.   On: 05/05/2015 15:59   US Abdomen Limited Ruq  05/05/2015   CLINICAL DATA:  Abdominal pain and nausea. Symptoms for 4 days. History of diabetes and hypertension, COPD, CHF, chronic kidney disease, dialysis.  EXAM: US ABDOMEN LIMITED - RIGHT UPPER QUADRANT  COMPARISON:  CT abdomen and pelvis 05/05/2015  FINDINGS: Gallbladder:  Small stones demonstrated in the gallbladder. Stone versus small polyp in the gallbladder neck. No gallbladder distention. Mild borderline gallbladder wall thickening with trace fluid between the liver and gallbladder. Murphy's sign is negative.  Common bile duct:  Diameter: 4.2 mm, normal  Liver:  Diffusely increased parenchymal echotexture with nodular contour suggesting possible cirrhosis.  IMPRESSION: Small stones in the gallbladder with borderline wall thickening and small amount of edema. Murphy's sign is negative. Findings are indeterminate but suggestive of cholecystitis. Heterogeneous liver parenchymal echotexture with nodular contour suggest possible cirrhosis.   Electronically Signed   By: Burman Nieves M.D.   On: 05/05/2015 23:17    EKG: Independently reviewed.  Assessment/Plan Principal Problem:    Acute calculous cholecystitis Active Problems:   ESRD on dialysis   Diabetes   Chronic systolic heart failure   1. Acute calculous cholecystitis - as seen on CT and [place] 1. No evidence of bile duct obstruction, no evidence of LFT elevation 2. Repeat CMP in AM 3. Unasyn per pharm 4. General surgery consulted, they say they will eval in AM and decide between cholecystectomy and cholecystostomy tube 1. From a risk standpoint she is ASA III 5. NPO 2. ESRD - have called nephrology consult line and let them know about patients admission and need for TTS dialysis 3. DM - appears to be diet controlled, CBG checks AC/HS  Gen surgery has been formally consulted and will see patient in AM.  Code Status: Full Code  Family Communication: No family in room Disposition Plan: Admit to inpatient   Time spent: 70 min  GARDNER, JARED M. [facility] Pager [phone]  If 7AM-7PM, please contact the day team taking care of the patient [email or web address] Password [email or web address] 05/06/2015, 12:38 AM

## 2015-05-06 NOTE — Progress Notes (Addendum)
[facility] called to notify that they had begun HIDA scan on patient, but she refused to finish. Per tech, they were able to get pictures of the gallbladder, but none of the bowel. Dr. Roda Shutters and surgical PA both notified.

## 2015-05-07 ENCOUNTER — Encounter (HOSPITAL_COMMUNITY)
Admission: EM | Disposition: A | Discharge status: Home Health | Payer: Self-pay | Source: Home / Self Care | Attending: Internal Medicine

## 2015-05-07 DIAGNOSIS — Z992 Dependence on renal dialysis: Secondary | ICD-10-CM

## 2015-05-07 DIAGNOSIS — I5022 Chronic systolic (congestive) heart failure: Secondary | ICD-10-CM

## 2015-05-07 DIAGNOSIS — I25708 Atherosclerosis of coronary artery bypass graft(s), unspecified, with other forms of angina pectoris: Secondary | ICD-10-CM | POA: Insufficient documentation

## 2015-05-07 DIAGNOSIS — K8000 Calculus of gallbladder with acute cholecystitis without obstruction: Secondary | ICD-10-CM

## 2015-05-07 DIAGNOSIS — N186 End stage renal disease: Secondary | ICD-10-CM

## 2015-05-07 LAB — GLUCOSE, CAPILLARY
GLUCOSE-CAPILLARY: 57 mg/dL — AB (ref 65–99)
GLUCOSE-CAPILLARY: 84 mg/dL (ref 65–99)
GLUCOSE-CAPILLARY: 90 mg/dL (ref 65–99)
Glucose-Capillary: 192 mg/dL — ABNORMAL HIGH (ref 65–99)
Glucose-Capillary: 113 mg/dL — ABNORMAL HIGH (ref 65–99)

## 2015-05-07 LAB — MRSA PCR SCREENING: MRSA by PCR: NEGATIVE

## 2015-05-07 SURGERY — LAPAROSCOPIC CHOLECYSTECTOMY WITH INTRAOPERATIVE CHOLANGIOGRAM
Anesthesia: General

## 2015-05-07 MED ORDER — SODIUM CHLORIDE 0.9 % IV SOLN
3.0000 g | INTRAVENOUS | Status: AC
  Filled 2015-05-07: qty 3

## 2015-05-07 MED ORDER — DARBEPOETIN ALFA 60 MCG/0.3ML IJ SOSY
60.0000 ug | PREFILLED_SYRINGE | INTRAMUSCULAR | Status: DC
  Administered 2015-05-09: 60 ug via INTRAVENOUS
  Filled 2015-05-07: qty 0.3

## 2015-05-07 MED ORDER — RENA-VITE PO TABS
1.0000 | ORAL_TABLET | Freq: Every day | ORAL | Status: DC
  Administered 2015-05-07 – 2015-05-09 (×2): 1 via ORAL
  Filled 2015-05-07 (×3): qty 1

## 2015-05-07 MED ORDER — SODIUM CHLORIDE 0.9 % IV SOLN: 3.0000 g | INTRAVENOUS | Status: DC

## 2015-05-07 MED ORDER — DEXTROSE 50 % IV SOLN
INTRAVENOUS | Status: AC
  Administered 2015-05-07: 50 mL
  Filled 2015-05-07: qty 50

## 2015-05-07 NOTE — Progress Notes (Signed)
Patient ID: Kathleen [name], female   DOB: 03-17-1951, 64 y.o.   MRN: [id number]    Subjective: Pt feels ok this morning.  Nausea improved, but she still got some pain with clear liquids yesterday.  Wants to proceed with surgery this admit.  Objective: Vital signs in last 24 hours: Temp:  [97.4 F (36.3 C)-97.5 F (36.4 C)] 97.4 F (36.3 C) (09/07 0621) Pulse Rate:  [75-85] 79 (09/07 0621) Resp:  [16-18] 17 (09/07 0621) BP: (107-145)/(56-74) 118/64 mmHg (09/07 0621) SpO2:  [98 %-100 %] 98 % (09/07 0621) Weight:  [67.4 kg (148 lb 9.4 oz)] 67.4 kg (148 lb 9.4 oz) (09/06 1945) Last BM Date: 05/05/15  Intake/Output from previous day: 09/06 0701 - 09/07 0700 In: 580 [P.O.:480; IV Piggyback:100] Out: 2500  Intake/Output this shift:    PE: Abd: soft, minimally tender, +BS, ND Heart: regular LungS: CTAB  Lab Results:   Recent Labs  05/05/15 1520 05/06/15 0532  WBC 7.0 6.6  HGB 10.9* 10.4*  HCT 35.1* 33.7*  PLT 201 198   BMET  Recent Labs  05/05/15 1520 05/06/15 0532  NA 132* 133*  K 5.3* 5.0  CL 94* 97*  CO2 23 23  GLUCOSE 179* 78  BUN 41* 47*  CREATININE 4.57* 5.00*  CALCIUM 8.9 8.9   PT/INR No results for input(s): LABPROT, INR in the last 72 hours. CMP     Component Value Date/Time   NA 133* 05/06/2015 0532   K 5.0 05/06/2015 0532   CL 97* 05/06/2015 0532   CO2 23 05/06/2015 0532   GLUCOSE 78 05/06/2015 0532   BUN 47* 05/06/2015 0532   CREATININE 5.00* 05/06/2015 0532   CALCIUM 8.9 05/06/2015 0532   PROT 6.7 05/06/2015 0532   ALBUMIN 3.2* 05/06/2015 0532   AST 44* 05/06/2015 0532   ALT 26 05/06/2015 0532   ALKPHOS 72 05/06/2015 0532   BILITOT 1.2 05/06/2015 0532   GFRNONAA 8* 05/06/2015 0532   GFRAA 10* 05/06/2015 0532   Lipase     Component Value Date/Time   LIPASE 24 05/05/2015 1520       Studies/Results: Nm Hepatobiliary Liver Func  05/06/2015   CLINICAL DATA:  Right upper quadrant pain with nausea and vomiting. Gallstones. Evaluate  for acute cholecystitis.  EXAM: NUCLEAR MEDICINE HEPATOBILIARY IMAGING  TECHNIQUE: Sequential images of the abdomen were obtained out to 60 minutes following intravenous administration of radiopharmaceutical.  RADIOPHARMACEUTICALS:  5.1 mCi Tc-67m  Choletec IV  COMPARISON:  None.  FINDINGS: Prompt and homogeneous uptake into the liver. Prominent blood pool in this patient with suspected cirrhosis.  Central bile ducts seen by 25 min and the gallbladder by 30 min. No tracer in the bowel at 60 min, when patient terminated the examination.  IMPRESSION: 1. Patent cystic duct. 2. No bowel activity at 60 min, cannot confirm common bile duct patency. Patient terminated exam before delayed imaging could be acquired.   Electronically Signed   By: Marnee Spring M.D.   On: 05/06/2015 10:06   Ct Abdomen Pelvis W Contrast  05/05/2015   CLINICAL DATA:  64 year old female with mid abdominal pain and emesis.  EXAM: CT ABDOMEN AND PELVIS WITH CONTRAST  TECHNIQUE: Multidetector CT imaging of the abdomen and pelvis was performed using the standard protocol following bolus administration of intravenous contrast.  CONTRAST:  OMNIPAQUE IOHEXOL 300 MG/ML  SOLN  COMPARISON:  CT dated 03/07/2013  FINDINGS: Evaluation of the pelvic structures is limited due to streak artifact caused by right femoral  metallic hardware.  Small left pleural effusion. The visualized lung bases are clear with there is cardiomegaly with dilatation of the left ventricle. Retrograde flow of contrast noted within the IVC indicative of right cardiac dysfunction. Coronary vascular calcification and CABG clips noted. No intra-abdominal free air. There is a small free fluid within the pelvis.  There is mild irregularity of the hepatic contour concerning for underlying cirrhosis. Clinical correlation is recommended. The gallbladder wall appears thickened and edematous concerning for acute cholecystitis. There is a small pericholecystic fluid. Small calcified  stone may be present at the neck of the gallbladder or within the cystic duct. Ultrasound is recommended for better evaluation of the gallbladder. The pancreas splenic and adrenal glands appear unremarkable. There is no hydronephrosis on either side. The visualized ureters and urinary bladder appear unremarkable. The uterus is visualized but not well evaluated.  constipation. There is no evidence of bowel obstruction. There is mild diffuse thickening of the wall of the distal and terminal ileum, likely related to chronic inflammation. There is luminal narrowing of a loop of distal small bowel within the pelvis likely related to chronic inflammation and stricture. Correlation with history of Crohn's disease recommended. The appendix is not visualized with certainty.  There is aortoiliac atherosclerotic disease. The origins of the celiac axis, SMA, IMA as well as the origins of the renal arteries appear patent. There is focal narrowing of the origin of the celiac axis with poststenotic dilatation of the vessel measuring up to 9 mm. There is no lymphadenopathy. There is lumbar scoliosis with degenerative changes of the spine. There is angulation and S1-S2, likely related to an old fracture. There is disc desiccation and vacuum phenomena at L4-5. Multilevel diffuse disc bulge most prominent at L3-L4 and L5-S1. No acute fracture. Right femoral orthopedic hardware noted. There is diffuse subcutaneous soft tissue stranding and edema. Small fat containing umbilical hernia noted.  IMPRESSION: Inflammatory changes of the gallbladder most compatible with acute cholecystitis. Ultrasound recommended for better evaluation of the gallbladder.  Irregular hepatic contour concerning for cirrhosis. Clinical correlation is recommended.  Segmental thickening of the distal and terminal ileum with areas of strictures, likely related to chronic inflammation. Correlation with history of inflammatory bowel disease such as Crohn's  recommended. No bowel obstruction.   Electronically Signed   By: Elgie Collard M.D.   On: 05/05/2015 21:24   Dg Chest Port 1 View  05/05/2015   CLINICAL DATA:  Acute shortness of breath today.  EXAM: PORTABLE CHEST - 1 VIEW  COMPARISON:  03/13/2015 and prior chest radiographs dating back to 11/11/2013  FINDINGS: Cardiomegaly and CABG changes again noted.  Surgical changes overlying the left upper lung again noted.  There is no evidence of focal airspace disease, pulmonary edema, suspicious pulmonary nodule/mass, pleural effusion, or pneumothorax. No acute bony abnormalities are identified.  IMPRESSION: Cardiomegaly without evidence of acute cardiopulmonary disease.   Electronically Signed   By: Harmon Pier M.D.   On: 05/05/2015 15:59   US Abdomen Limited Ruq  05/05/2015   CLINICAL DATA:  Abdominal pain and nausea. Symptoms for 4 days. History of diabetes and hypertension, COPD, CHF, chronic kidney disease, dialysis.  EXAM: US ABDOMEN LIMITED - RIGHT UPPER QUADRANT  COMPARISON:  CT abdomen and pelvis 05/05/2015  FINDINGS: Gallbladder:  Small stones demonstrated in the gallbladder. Stone versus small polyp in the gallbladder neck. No gallbladder distention. Mild borderline gallbladder wall thickening with trace fluid between the liver and gallbladder. Murphy's sign is negative.  Common bile  duct:  Diameter: 4.2 mm, normal  Liver:  Diffusely increased parenchymal echotexture with nodular contour suggesting possible cirrhosis.  IMPRESSION: Small stones in the gallbladder with borderline wall thickening and small amount of edema. Murphy's sign is negative. Findings are indeterminate but suggestive of cholecystitis. Heterogeneous liver parenchymal echotexture with nodular contour suggest possible cirrhosis.   Electronically Signed   By: Burman Nieves M.D.   On: 05/05/2015 23:17    Anti-infectives: Anti-infectives    Start     Dose/Rate Route Frequency Ordered Stop   05/08/15 0600  Ampicillin-Sulbactam  (UNASYN) 3 g in sodium chloride 0.9 % 100 mL IVPB     3 g 100 mL/hr over 60 Minutes Intravenous On call to O.R. 05/07/15 0817 05/09/15 0559   05/06/15 1000  fluconazole (DIFLUCAN) tablet 100 mg     100 mg Oral Daily 05/06/15 0008     05/06/15 0130  ampicillin-sulbactam (UNASYN) 1.5 g in sodium chloride 0.9 % 50 mL IVPB  Status:  Discontinued     1.5 g 100 mL/hr over 30 Minutes Intravenous 2 times daily 05/06/15 0053 05/07/15 0817       Assessment/Plan  1. Symptomatic cholelithiasis -patient is still on plavix.  I have held this, but given she got HD yesterday this has been dialyzed off some.   -CHF with EF of 25%.  Will ask cards to see the patient today for pre-op evaluation and clearance -clear liquids today, NPO p MN -stop unasyn as she doesn't have cholecystitis 2. ESRD, HD -per primary and renal 3. CHF, EF 25% -cards consult   LOS: 1 day    Zalea Pete E 05/07/2015, 8:21 AM Pager: [phone]

## 2015-05-07 NOTE — Progress Notes (Signed)
[facility] Progress Note  Assessment/Plan: 1. Abdominal pain - HIDA scan stopped due to nausea, no evidence of cholecystitis; showed gallstones with borderline thickening - pain resolved; needs card eval before surgery is considered 2. ESRD - TTS Had 3 hr HD on Tuesday - time reduced due to large pt volume - on no heparin HD as outpt, unclear why; plan HD for Thursday - can schedule around other procedures.-  3. Hypertension/volume -BP variable - still some excess volume on exam -net UF 2.5 L on Tuesday - continue to titrate down - no post HD weight done - but should have been below EDW baseline on pre HD weight of 66.7 4. Anemia - Hgb stable - continue weekly Fe - due for redose 9/8 5. Metabolic bone disease - Continue calcitriol 6. Nutrition - diet advanced to CL Sheffield Slider, PA-C [facility] Beeper [phone] 05/07/2015,10:49 AM  LOS: 1 day   Pt seen, examined and agree w A/P as above.  Vinson Moselle MD pager [phone]    cell [phone] 05/07/2015, 3:47 PM    Subjective:   Feeling better, tolerating CL still abdominal pain   Objective Filed Vitals:   05/06/15 2200 05/06/15 2230 05/06/15 2240 05/07/15 0621  BP: 107/66 111/65 123/62 118/64  Pulse: 77 75 79 79  Temp:    97.4 F (36.3 C)  TempSrc:    Oral  Resp: 16 16 16 17   Height:      Weight:      SpO2:    98%   Physical Exam General: NAD, face less puffy Heart: RRR Lungs: no rales Abdomen: soft RUQ tenderness Extremities: no LE edema Dialysis Access: left upper AVF + bruit  CXR - clear  Dialysis Orders: Center:[facility] 160 3.5 EDW 66 400/600 3 K 2.25 Ca pofile 4 Mircera 75 last 8/25 venofer 50 calcitriol 1 left upper AVF Recent labs: Hgb 10.9 stable 14% sat ferritin 1237 Ca/P ok iPTH 562  Additional Objective Labs: Basic Metabolic Panel:  Recent Labs Lab 05/05/15 1520 05/06/15 0532  NA 132* 133*  K 5.3* 5.0  CL 94* 97*  CO2 23 23  GLUCOSE 179* 78  BUN 41* 47*   CREATININE 4.57* 5.00*  CALCIUM 8.9 8.9   Liver Function Tests:  Recent Labs Lab 05/05/15 1520 05/06/15 0532  AST 36 44*  ALT 19 26  ALKPHOS 79 72  BILITOT 1.1 1.2  PROT 7.0 6.7  ALBUMIN 3.2* 3.2*    Recent Labs Lab 05/05/15 1520  LIPASE 24   CBC:  Recent Labs Lab 05/05/15 1520 05/06/15 0532  WBC 7.0 6.6  NEUTROABS 5.7  --   HGB 10.9* 10.4*  HCT 35.1* 33.7*  MCV 96.7 95.5  PLT 201 198  Cardiac Enzymes:  Recent Labs Lab 05/05/15 1520 05/05/15 2149  TROPONINI 0.06* 0.07*   CBG:  Recent Labs Lab 05/06/15 0818 05/06/15 1150 05/06/15 1726 05/07/15 0740 05/07/15 0818  GLUCAP 163* 118* 152* 57* 192*    Studies/Results: Nm Hepatobiliary Liver Func  05/06/2015   CLINICAL DATA:  Right upper quadrant pain with nausea and vomiting. Gallstones. Evaluate for acute cholecystitis.  EXAM: NUCLEAR MEDICINE HEPATOBILIARY IMAGING  TECHNIQUE: Sequential images of the abdomen were obtained out to 60 minutes following intravenous administration of radiopharmaceutical.  RADIOPHARMACEUTICALS:  5.1 mCi Tc-68m  Choletec IV  COMPARISON:  None.  FINDINGS: Prompt and homogeneous uptake into the liver. Prominent blood pool in this patient with suspected cirrhosis.  Central bile ducts seen by 25 min and  the gallbladder by 30 min. No tracer in the bowel at 60 min, when patient terminated the examination.  IMPRESSION: 1. Patent cystic duct. 2. No bowel activity at 60 min, cannot confirm common bile duct patency. Patient terminated exam before delayed imaging could be acquired.   Electronically Signed   By: Marnee Spring M.D.   On: 05/06/2015 10:06   Ct Abdomen Pelvis W Contrast  05/05/2015   CLINICAL DATA:  64 year old female with mid abdominal pain and emesis.  EXAM: CT ABDOMEN AND PELVIS WITH CONTRAST  TECHNIQUE: Multidetector CT imaging of the abdomen and pelvis was performed using the standard protocol following bolus administration of intravenous contrast.  CONTRAST:  OMNIPAQUE  IOHEXOL 300 MG/ML  SOLN  COMPARISON:  CT dated 03/07/2013  FINDINGS: Evaluation of the pelvic structures is limited due to streak artifact caused by right femoral metallic hardware.  Small left pleural effusion. The visualized lung bases are clear with there is cardiomegaly with dilatation of the left ventricle. Retrograde flow of contrast noted within the IVC indicative of right cardiac dysfunction. Coronary vascular calcification and CABG clips noted. No intra-abdominal free air. There is a small free fluid within the pelvis.  There is mild irregularity of the hepatic contour concerning for underlying cirrhosis. Clinical correlation is recommended. The gallbladder wall appears thickened and edematous concerning for acute cholecystitis. There is a small pericholecystic fluid. Small calcified stone may be present at the neck of the gallbladder or within the cystic duct. Ultrasound is recommended for better evaluation of the gallbladder. The pancreas splenic and adrenal glands appear unremarkable. There is no hydronephrosis on either side. The visualized ureters and urinary bladder appear unremarkable. The uterus is visualized but not well evaluated.  constipation. There is no evidence of bowel obstruction. There is mild diffuse thickening of the wall of the distal and terminal ileum, likely related to chronic inflammation. There is luminal narrowing of a loop of distal small bowel within the pelvis likely related to chronic inflammation and stricture. Correlation with history of Crohn's disease recommended. The appendix is not visualized with certainty.  There is aortoiliac atherosclerotic disease. The origins of the celiac axis, SMA, IMA as well as the origins of the renal arteries appear patent. There is focal narrowing of the origin of the celiac axis with poststenotic dilatation of the vessel measuring up to 9 mm. There is no lymphadenopathy. There is lumbar scoliosis with degenerative changes of the spine.  There is angulation and S1-S2, likely related to an old fracture. There is disc desiccation and vacuum phenomena at L4-5. Multilevel diffuse disc bulge most prominent at L3-L4 and L5-S1. No acute fracture. Right femoral orthopedic hardware noted. There is diffuse subcutaneous soft tissue stranding and edema. Small fat containing umbilical hernia noted.  IMPRESSION: Inflammatory changes of the gallbladder most compatible with acute cholecystitis. Ultrasound recommended for better evaluation of the gallbladder.  Irregular hepatic contour concerning for cirrhosis. Clinical correlation is recommended.  Segmental thickening of the distal and terminal ileum with areas of strictures, likely related to chronic inflammation. Correlation with history of inflammatory bowel disease such as Crohn's recommended. No bowel obstruction.   Electronically Signed   By: Elgie Collard M.D.   On: 05/05/2015 21:24   Dg Chest Port 1 View  05/05/2015   CLINICAL DATA:  Acute shortness of breath today.  EXAM: PORTABLE CHEST - 1 VIEW  COMPARISON:  03/13/2015 and prior chest radiographs dating back to 11/11/2013  FINDINGS: Cardiomegaly and CABG changes again noted.  Surgical  changes overlying the left upper lung again noted.  There is no evidence of focal airspace disease, pulmonary edema, suspicious pulmonary nodule/mass, pleural effusion, or pneumothorax. No acute bony abnormalities are identified.  IMPRESSION: Cardiomegaly without evidence of acute cardiopulmonary disease.   Electronically Signed   By: Harmon Pier M.D.   On: 05/05/2015 15:59   US Abdomen Limited Ruq  05/05/2015   CLINICAL DATA:  Abdominal pain and nausea. Symptoms for 4 days. History of diabetes and hypertension, COPD, CHF, chronic kidney disease, dialysis.  EXAM: US ABDOMEN LIMITED - RIGHT UPPER QUADRANT  COMPARISON:  CT abdomen and pelvis 05/05/2015  FINDINGS: Gallbladder:  Small stones demonstrated in the gallbladder. Stone versus small polyp in the gallbladder  neck. No gallbladder distention. Mild borderline gallbladder wall thickening with trace fluid between the liver and gallbladder. Murphy's sign is negative.  Common bile duct:  Diameter: 4.2 mm, normal  Liver:  Diffusely increased parenchymal echotexture with nodular contour suggesting possible cirrhosis.  IMPRESSION: Small stones in the gallbladder with borderline wall thickening and small amount of edema. Murphy's sign is negative. Findings are indeterminate but suggestive of cholecystitis. Heterogeneous liver parenchymal echotexture with nodular contour suggest possible cirrhosis.   Electronically Signed   By: Burman Nieves M.D.   On: 05/05/2015 23:17   Medications:   . ampicillin-sulbactam (UNASYN) IV  3 g Intravenous To SS-Surg  . ARIPiprazole  5 mg Oral Daily  . aspirin EC  81 mg Oral Daily  . budesonide-formoterol  2 puff Inhalation BID  . calcitRIOL  1 mcg Oral Q T,Th,Sa-HD  . calcium acetate  667 mg Oral TID WC  . citalopram  40 mg Oral QHS  . docusate sodium  100 mg Oral BID  . fluconazole  100 mg Oral Daily  . heparin  5,000 Units Subcutaneous 3 times per day  . pantoprazole  80 mg Oral Daily

## 2015-05-07 NOTE — Progress Notes (Signed)
PROGRESS NOTE  Kathleen [name] RFF:[id number] DOB: 06-02-1951 DOA: 05/05/2015 PCP: No PCP Per Patient  HPI/Recap of past 24 hours:  Mild pain after clears at lunch yesterday, no pain since then, tolerating clears  Assessment/Plan: Principal Problem:   Acute calculous cholecystitis Active Problems:   ESRD on dialysis   Diabetes   Chronic systolic heart failure  1. Acute calculous cholecystitis - as seen on CT and [place] No evidence of bile duct obstruction, no evidence of LFT elevation Mild transaminitis, normal alk phos Unasyn per pharm General surgery consulted, request cardiology consult for preop eval for lap chole, plavix held per surgery On clears 2. ESRD/TTS dialysis - nephrology input appreciated 3. DM - appears to be diet controlled, last a1c  6.3 in 02/2015 4. H/o CHF last EF 25-30%, volume managed by HD, cardiology requested by surgery for pre op eval 5. H/o COPD, stable, quit smoking in 2010. Not on home oxygen, reported baseline limited by DOE with walking from one room to another, baseline intermittent wheezing, no wheezing since in the hospital, on symbicort, has not  Require prn nebs.  Code Status: full  Family Communication: patient   Disposition Plan: remain in the hospital   Consultants:  gen surg  Cardiology ( gen surg requested for preop eval)  Nephrology   Procedures:  HD  Antibiotics:  unasyn   Objective: BP 118/64 mmHg  Pulse 79  Temp(Src) 97.4 F (36.3 C) (Oral)  Resp 17  Ht 5' 5"  (1.651 m)  Wt 148 lb 9.4 oz (67.4 kg)  BMI 24.73 kg/m2  SpO2 96%  Intake/Output Summary (Last 24 hours) at 05/07/15 1105 Last data filed at 05/06/15 2240  Gross per 24 hour  Intake    580 ml  Output   2500 ml  Net  -1920 ml   Filed Weights   05/06/15 0139 05/06/15 0146 05/06/15 1945  Weight: 147 lb 4.3 oz (66.8 kg) 147 lb 0.8 oz (66.7 kg) 148 lb 9.4 oz (67.4 kg)    Exam:   General:  NAD  Cardiovascular: RRR, with ectopic beats  Respiratory:  CTABL  Abdomen: Soft/ND/NT, positive BS  Musculoskeletal: No Edema  Neuro: aaox3  Data Reviewed: Basic Metabolic Panel:  Recent Labs Lab 05/05/15 1520 05/06/15 0532  NA 132* 133*  K 5.3* 5.0  CL 94* 97*  CO2 23 23  GLUCOSE 179* 78  BUN 41* 47*  CREATININE 4.57* 5.00*  CALCIUM 8.9 8.9   Liver Function Tests:  Recent Labs Lab 05/05/15 1520 05/06/15 0532  AST 36 44*  ALT 19 26  ALKPHOS 79 72  BILITOT 1.1 1.2  PROT 7.0 6.7  ALBUMIN 3.2* 3.2*    Recent Labs Lab 05/05/15 1520  LIPASE 24   No results for input(s): AMMONIA in the last 168 hours. CBC:  Recent Labs Lab 05/05/15 1520 05/06/15 0532  WBC 7.0 6.6  NEUTROABS 5.7  --   HGB 10.9* 10.4*  HCT 35.1* 33.7*  MCV 96.7 95.5  PLT 201 198   Cardiac Enzymes:    Recent Labs Lab 05/05/15 1520 05/05/15 2149  TROPONINI 0.06* 0.07*   BNP (last 3 results)  Recent Labs  05/05/15 1520  BNP >4500.0*    ProBNP (last 3 results) No results for input(s): PROBNP in the last 8760 hours.  CBG:  Recent Labs Lab 05/06/15 0818 05/06/15 1150 05/06/15 1726 05/07/15 0740 05/07/15 0818  GLUCAP 163* 118* 152* 57* 192*    Recent Results (from the past 240 hour(s))  Gram  stain     Status: None   Collection Time: 05/05/15  3:41 PM  Result Value Ref Range Status   Specimen Description URINE, CATHETERIZED  Final   Special Requests Normal  Final   Gram Stain   Final    WBC PRESENT, PREDOMINANTLY MONONUCLEAR NO ORGANISMS SEEN SQUAMOUS EPITHELIAL CELLS PRESENT CYTOSPIN    Report Status 05/05/2015 FINAL  Final     Studies: No results found.  Scheduled Meds: . ampicillin-sulbactam (UNASYN) IV  3 g Intravenous To SS-Surg  . ARIPiprazole  5 mg Oral Daily  . aspirin EC  81 mg Oral Daily  . budesonide-formoterol  2 puff Inhalation BID  . calcitRIOL  1 mcg Oral Q T,Th,Sa-HD  . calcium acetate  667 mg Oral TID WC  . citalopram  40 mg Oral QHS  . docusate sodium  100 mg Oral BID  . fluconazole  100 mg  Oral Daily  . heparin  5,000 Units Subcutaneous 3 times per day  . multivitamin  1 tablet Oral QHS  . pantoprazole  80 mg Oral Daily    Continuous Infusions:    Time spent: 53mns  Ardelia Wrede MD, PhD  [facility] Pager [phone] If 7PM-7AM, please contact night-coverage at [email or web address], password [email or web address]9/02/2015, 11:05 AM  LOS: 1 day

## 2015-05-07 NOTE — Progress Notes (Signed)
Hypoglycemic Event  CBG: 57  Treatment: D50 IV 25 mL  Symptoms: None  Follow-up CBG: Time:0818 CBG Result:192  Possible Reasons for Event: Inadequate meal intake pt NPO for possible surgery  Comments/MD notified:Kelly Earl Gala, PA notified who was changing pt back to clear diet since pt will not be having surgery today. Sent page to Dr. Roda Shutters who is attending.     Sherene Sires  Remember to initiate Hypoglycemia Order Set & complete

## 2015-05-07 NOTE — Consult Note (Signed)
CARDIOLOGY CONSULT NOTE   Patient ID: Kathleen [name] MRN: [id number], DOB/AGE: Mar 18, 1951   Admit date: 05/05/2015 Date of Consult: 05/07/2015   Primary Physician: No PCP Per Patient Primary Cardiologist: Dr. Bonnielee Haff, [facility] in [place], [phone]; fax [phone]  Pt. Profile  64 year old Caucasian female with past medical history of CAD s/p CABG 2010, ESRD on HD T/TH/Sat, HTN, DM, ICM, chronic systolic HF with baseline EF 25-30%, moderate/severe MR, interstitial lung dx with pulm fibrosis s/p VATS biopsy by Dr. Edwyna Shell 2007 and PAD s/p PCI to bilateral LE in Jule/Aug of 2016 presented with acute cholecystitis, cardiology has been consulted for preoperative clearance.  Problem List  Past Medical History  Diagnosis Date  . Chronic kidney disease   . Diabetes mellitus without complication   . Hypertension   . COPD (chronic obstructive pulmonary disease)   . Ischemic cardiomyopathy   . Anemia   . Family history of anesthesia complication     MOTHER HAD NAUSEA  . CHF (congestive heart failure)     CHRONIC SYSTOLIC  . Shortness of breath     Past Surgical History  Procedure Laterality Date  . Knee cartilage surgery Left 2007  . Video assisted thoracoscopy (vats)/ lymph node sampling Left 2007  . Incontinence surgery  ?2006  . Coronary artery bypass graft  2010    x 1     Allergies  Allergies  Allergen Reactions  . Statins     Side effects of leg pain and aching with "cholesterol medications", pt requested that no statins be given to her from this time forward  . Levaquin [Levofloxacin In D5w] Hives  . Metformin And Related Diarrhea  . Other Other (See Comments)    Mycins allergy, Resource [employer] feeding supplement causes high blood sugar   . Sulfa Antibiotics Hives  . Tobradex [Tobramycin-Dexamethasone] Other (See Comments)    redness    HPI   The patient is a 64 year old Caucasian female with past medical history of CAD s/p CABG 2010, ESRD on  HD T/TH/Sat, HTN, DM, ICM, chronic systolic HF with baseline EF 25-30%, moderate/severe MR, interstitial lung dx with pulm fibrosis s/p VATS biopsy by Dr. Edwyna Shell 2007 and PAD s/p PCI to bilateral LE in Jule/Aug of 2016. Patient has a multiple history of PCI with stent placement coronary vessels prior to bypass surgery. According to her husband, in 2010, she had recurrent chest discomfort and had a syncopal episode, she underwent cardiac catheterization at [facility] and was told she needed bypass surgery. That was the last time she had a cardiac catheterization. She has not had any angina symptoms since. She has known severe mitral regurgitation since her previous echocardiogram in March 2015 which also shows EF 20-25% at the time with moderate AR. She has a recent echocardiogram done on 03/05/2015 which showed EF slightly improved to 25-30%, diffuse hypokinesis, moderate AR, moderate MR, PA peak pressure 47. She has been compliant with her medication and has not had recurrent anginal symptoms. She does notice occasional heart thumping/palpitation, but nothing is reminiscent of her symptom prior to last cath. According to family member, she has been having leg pains recently and underwent bilateral stents to lower extremity at [facility]. She initially underwent stent placement on the right lower extremity in July, 3 weeks later, on 04/18/2015, she underwent stent placement in the left lower extremity. She has been on aspirin and Plavix since.   Patient lives with her husband and does not ambulate almost at all after  her previous hip fracture and subsequent tailbone fracture. For more than a month now, she has been noting increased abdominal discomfort with food. She didn't know what it was. She also was recently diagnosed with a UTI and finished a course of antibiotic as outpatient. She eventually came to [facility] on 05/06/2015 with complaint of worsening right upper quadrant abdominal pain along with  nausea and vomiting for 4 days. CT of the abdomen was concerning for acute cholecystitis, irregular hepatic contour concerning for cirrhosis, segmental thickening of the distal and terminal ileum with areas of strictures likely related to chronic inflammation, no bowel obstruction. She was admitted by internal medicine service and surgery was consulted. Abdominal ultrasound done on 9/5 showed small stones in the gallbladder with borderline wall thickening and a small amount of edema, finding intermediate but suggestive of cholecystitis. She underwent HIDA scan on 9/6 which showed no bowel activity is 60 minute, cannot confirm common bile duct patency, patent cystic duct. Her creatinine during this admission is close to 5, initial BNP was greater than 4500, troponin borderline elevated at 0.06/0.07. She denies any chest discomfort during this admission. Cardiology has been consulted for preoperative clearance for possible cholecystectomy.  Of note, patient recently had lower extremity stent, with last stent on the left lower extremity on 04/18/2015. She was previously on aspirin and Plavix, plavix has been stopped on after the morning dose on 05/06/2015 presumably in anticipation of surgery.   Inpatient Medications  . ampicillin-sulbactam (UNASYN) IV  3 g Intravenous To SS-Surg  . ARIPiprazole  5 mg Oral Daily  . aspirin EC  81 mg Oral Daily  . budesonide-formoterol  2 puff Inhalation BID  . calcitRIOL  1 mcg Oral Q T,Th,Sa-HD  . calcium acetate  667 mg Oral TID WC  . citalopram  40 mg Oral QHS  . [START ON 05/08/2015] darbepoetin (ARANESP) injection - DIALYSIS  60 mcg Intravenous Q Thu-HD  . docusate sodium  100 mg Oral BID  . fluconazole  100 mg Oral Daily  . heparin  5,000 Units Subcutaneous 3 times per day  . multivitamin  1 tablet Oral QHS  . pantoprazole  80 mg Oral Daily    Family History History reviewed. No pertinent family history.   Social History Social History   Social History  .  Marital Status: Married    Spouse Name: N/A  . Number of Children: N/A  . Years of Education: N/A   Occupational History  . Retired    Social History Main Topics  . Smoking status: Former Smoker -- 80 years    Quit date: 08/30/2008  . Smokeless tobacco: Never Used  . Alcohol Use: No  . Drug Use: No  . Sexual Activity: Not on file   Other Topics Concern  . Not on file   Social History Narrative   Pt lives with husband.     Review of Systems  General:  No chills, fever, night sweats or weight changes.  Cardiovascular:  No chest pain, dyspnea on exertion, edema, orthopnea, paroxysmal nocturnal dyspnea. +palpitation/"heart thumping", no obvious chest pain Dermatological: No rash, lesions/masses Respiratory: No cough +chronic dyspnea without need for home O2 Urologic: Recent UTI - treated Abdominal:   No diarrhea, bright red blood per rectum, melena, or hematemesis +nausea, vomiting, abdominal pain after food, bad for 4 days but really has been ongoing for more than a month Neurologic:  No visual changes, changes in mental status. All other systems reviewed and are otherwise negative  except as noted above.  Physical Exam  Blood pressure 118/64, pulse 79, temperature 97.4 F (36.3 C), temperature source Oral, resp. rate 17, height 5\' 5"  (1.651 m), weight 148 lb 9.4 oz (67.4 kg), SpO2 96 %.  General: Pleasant, NAD Psych: Normal affect. Neuro: Alert and oriented X 3. Moves all extremities spontaneously. HEENT: Normal  Neck: Supple without bruits or JVD. Lungs:  Resp regular and unlabored. Bibasilar rale.  Heart: RRR no s3, s4. 3/6 systolic murmur at apex. L arm AVF noted Abdomen: Soft, non-tender, non-distended, BS + x 4.  Extremities: No clubbing, cyanosis or edema. DP/PT and equal bilaterally. 2+ radial pulse on R, unable to feel radial pulse on L (side of AVF)  Labs   Recent Labs  05/05/15 1520 05/05/15 2149  TROPONINI 0.06* 0.07*   Lab Results  Component Value  Date   WBC 6.6 05/06/2015   HGB 10.4* 05/06/2015   HCT 33.7* 05/06/2015   MCV 95.5 05/06/2015   PLT 198 05/06/2015    Recent Labs Lab 05/06/15 0532  NA 133*  K 5.0  CL 97*  CO2 23  BUN 47*  CREATININE 5.00*  CALCIUM 8.9  PROT 6.7  BILITOT 1.2  ALKPHOS 72  ALT 26  AST 44*  GLUCOSE 78    Radiology/Studies  Nm Hepatobiliary Liver Func  05/06/2015   CLINICAL DATA:  Right upper quadrant pain with nausea and vomiting. Gallstones. Evaluate for acute cholecystitis.  EXAM: NUCLEAR MEDICINE HEPATOBILIARY IMAGING  TECHNIQUE: Sequential images of the abdomen were obtained out to 60 minutes following intravenous administration of radiopharmaceutical.  RADIOPHARMACEUTICALS:  5.1 mCi Tc-67m  Choletec IV  COMPARISON:  None.  FINDINGS: Prompt and homogeneous uptake into the liver. Prominent blood pool in this patient with suspected cirrhosis.  Central bile ducts seen by 25 min and the gallbladder by 30 min. No tracer in the bowel at 60 min, when patient terminated the examination.  IMPRESSION: 1. Patent cystic duct. 2. No bowel activity at 60 min, cannot confirm common bile duct patency. Patient terminated exam before delayed imaging could be acquired.   Electronically Signed   By: Marnee Spring M.D.   On: 05/06/2015 10:06   Ct Abdomen Pelvis W Contrast  05/05/2015   CLINICAL DATA:  64 year old female with mid abdominal pain and emesis.  EXAM: CT ABDOMEN AND PELVIS WITH CONTRAST  TECHNIQUE: Multidetector CT imaging of the abdomen and pelvis was performed using the standard protocol following bolus administration of intravenous contrast.  CONTRAST:  OMNIPAQUE IOHEXOL 300 MG/ML  SOLN  COMPARISON:  CT dated 03/07/2013  FINDINGS: Evaluation of the pelvic structures is limited due to streak artifact caused by right femoral metallic hardware.  Small left pleural effusion. The visualized lung bases are clear with there is cardiomegaly with dilatation of the left ventricle. Retrograde flow of contrast  noted within the IVC indicative of right cardiac dysfunction. Coronary vascular calcification and CABG clips noted. No intra-abdominal free air. There is a small free fluid within the pelvis.  There is mild irregularity of the hepatic contour concerning for underlying cirrhosis. Clinical correlation is recommended. The gallbladder wall appears thickened and edematous concerning for acute cholecystitis. There is a small pericholecystic fluid. Small calcified stone may be present at the neck of the gallbladder or within the cystic duct. Ultrasound is recommended for better evaluation of the gallbladder. The pancreas splenic and adrenal glands appear unremarkable. There is no hydronephrosis on either side. The visualized ureters and urinary bladder appear unremarkable. The uterus  is visualized but not well evaluated.  constipation. There is no evidence of bowel obstruction. There is mild diffuse thickening of the wall of the distal and terminal ileum, likely related to chronic inflammation. There is luminal narrowing of a loop of distal small bowel within the pelvis likely related to chronic inflammation and stricture. Correlation with history of Crohn's disease recommended. The appendix is not visualized with certainty.  There is aortoiliac atherosclerotic disease. The origins of the celiac axis, SMA, IMA as well as the origins of the renal arteries appear patent. There is focal narrowing of the origin of the celiac axis with poststenotic dilatation of the vessel measuring up to 9 mm. There is no lymphadenopathy. There is lumbar scoliosis with degenerative changes of the spine. There is angulation and S1-S2, likely related to an old fracture. There is disc desiccation and vacuum phenomena at L4-5. Multilevel diffuse disc bulge most prominent at L3-L4 and L5-S1. No acute fracture. Right femoral orthopedic hardware noted. There is diffuse subcutaneous soft tissue stranding and edema. Small fat containing umbilical  hernia noted.  IMPRESSION: Inflammatory changes of the gallbladder most compatible with acute cholecystitis. Ultrasound recommended for better evaluation of the gallbladder.  Irregular hepatic contour concerning for cirrhosis. Clinical correlation is recommended.  Segmental thickening of the distal and terminal ileum with areas of strictures, likely related to chronic inflammation. Correlation with history of inflammatory bowel disease such as Crohn's recommended. No bowel obstruction.   Electronically Signed   By: Elgie Collard M.D.   On: 05/05/2015 21:24   Dg Chest Port 1 View  05/05/2015   CLINICAL DATA:  Acute shortness of breath today.  EXAM: PORTABLE CHEST - 1 VIEW  COMPARISON:  03/13/2015 and prior chest radiographs dating back to 11/11/2013  FINDINGS: Cardiomegaly and CABG changes again noted.  Surgical changes overlying the left upper lung again noted.  There is no evidence of focal airspace disease, pulmonary edema, suspicious pulmonary nodule/mass, pleural effusion, or pneumothorax. No acute bony abnormalities are identified.  IMPRESSION: Cardiomegaly without evidence of acute cardiopulmonary disease.   Electronically Signed   By: Harmon Pier M.D.   On: 05/05/2015 15:59   US Abdomen Limited Ruq  05/05/2015   CLINICAL DATA:  Abdominal pain and nausea. Symptoms for 4 days. History of diabetes and hypertension, COPD, CHF, chronic kidney disease, dialysis.  EXAM: US ABDOMEN LIMITED - RIGHT UPPER QUADRANT  COMPARISON:  CT abdomen and pelvis 05/05/2015  FINDINGS: Gallbladder:  Small stones demonstrated in the gallbladder. Stone versus small polyp in the gallbladder neck. No gallbladder distention. Mild borderline gallbladder wall thickening with trace fluid between the liver and gallbladder. Murphy's sign is negative.  Common bile duct:  Diameter: 4.2 mm, normal  Liver:  Diffusely increased parenchymal echotexture with nodular contour suggesting possible cirrhosis.  IMPRESSION: Small stones in the  gallbladder with borderline wall thickening and small amount of edema. Murphy's sign is negative. Findings are indeterminate but suggestive of cholecystitis. Heterogeneous liver parenchymal echotexture with nodular contour suggest possible cirrhosis.   Electronically Signed   By: Burman Nieves M.D.   On: 05/05/2015 23:17    ECG  Normal sinus rhythm, inverted P waves, T wave inversion in the lateral leads. No significant changes when compared to the previous EKG from earlier this year.  ASSESSMENT AND PLAN  1. Preoperative clearance for Acute cholecystitis   - patient has some degree of dementia, majority of the record in HPI has been obtained from husband and record. She does not ambulate at  all at home per family, very poor functional status. Family attributed it to her h/o hip and tailbone fracture  - recent echo showed mild improvement in EF, now 25-30%.   - echocardiogram done on 03/05/2015 which showed EF slightly improved to 25-30%, diffuse hypokinesis, moderate AR, moderate MR, PA peak pressure 47  - does have borderline trop which is flat during this admission with no symptom, no ACS pattern  - will discuss with MD regarding preop clearance, patient is at least moderate possibly high risk for any surgery given her mental status and comorbidities. Would avoid prolonged holding of plavix since her last stent to LLE was on 8/19 (less than a month)  2. CAD s/p CABG 2010  - Not a lot of record on location of previous CABG or previous PCI, however no recent symptoms to suggest angina  3. ESRD on HD T/TH/Sat via L arm AVF placed in 2015 by Dr. Sondra Come at [facility]  4. PAD s/p PCI to bilateral LE in Jule/Aug of 2016  - will discuss with MD regarding duration of DAPT after PV stent, she had RLE stent placed in [place] in July and LLE stent placement on 8/19  - plavix has been discontinued, last dose 05/06/2015.  5. Mildly acute on chronic systolic HF with baseline EF 20%  - mild bibasilar  rale on exam, will need HD tomorrow.   - chronically elevated BNP given valvular problem   6. Moderate to severe MR  - Echo on 11/13/2013 showed severe MR, repeat echo on 03/05/2015 showed moderate MR.    7. HTN  8. DM 9. ICM 10. interstitial lung dx with pulm fibrosis s/p VATS biopsy by Dr. Edwyna Shell 2007   Signed, Azalee Course, PA-C 05/07/2015, 2:03 PM

## 2015-05-08 DIAGNOSIS — K8050 Calculus of bile duct without cholangitis or cholecystitis without obstruction: Secondary | ICD-10-CM

## 2015-05-08 LAB — RENAL FUNCTION PANEL
Albumin: 3.1 g/dL — ABNORMAL LOW (ref 3.5–5.0)
Anion gap: 11 (ref 5–15)
BUN: 23 mg/dL — ABNORMAL HIGH (ref 6–20)
CO2: 25 mmol/L (ref 22–32)
Calcium: 8.5 mg/dL — ABNORMAL LOW (ref 8.9–10.3)
Chloride: 94 mmol/L — ABNORMAL LOW (ref 101–111)
Creatinine, Ser: 3.4 mg/dL — ABNORMAL HIGH (ref 0.44–1.00)
GFR calc Af Amer: 15 mL/min — ABNORMAL LOW (ref >60)
GFR calc non Af Amer: 13 mL/min — ABNORMAL LOW (ref >60)
Glucose, Bld: 164 mg/dL — ABNORMAL HIGH (ref 65–99)
Phosphorus: 3 mg/dL (ref 2.5–4.6)
Potassium: 4.4 mmol/L (ref 3.5–5.1)
Sodium: 130 mmol/L — ABNORMAL LOW (ref 135–145)

## 2015-05-08 LAB — CBC
HCT: 34.7 % — ABNORMAL LOW (ref 36.0–46.0)
Hemoglobin: 10.9 g/dL — ABNORMAL LOW (ref 12.0–15.0)
MCH: 30 pg (ref 26.0–34.0)
MCHC: 31.4 g/dL (ref 30.0–36.0)
MCV: 95.6 fL (ref 78.0–100.0)
Platelets: 143 10*3/uL — ABNORMAL LOW (ref 150–400)
RBC: 3.63 MIL/uL — ABNORMAL LOW (ref 3.87–5.11)
RDW: 18.7 % — ABNORMAL HIGH (ref 11.5–15.5)
WBC: 5.9 10*3/uL (ref 4.0–10.5)

## 2015-05-08 LAB — GLUCOSE, CAPILLARY
GLUCOSE-CAPILLARY: 197 mg/dL — AB (ref 65–99)
Glucose-Capillary: 75 mg/dL (ref 65–99)
Glucose-Capillary: 184 mg/dL — ABNORMAL HIGH (ref 65–99)

## 2015-05-08 MED ORDER — OXYCODONE-ACETAMINOPHEN 5-325 MG PO TABS
ORAL_TABLET | ORAL | Status: AC
  Administered 2015-05-08: 1 via ORAL
  Filled 2015-05-08: qty 1

## 2015-05-08 MED ORDER — CLOPIDOGREL BISULFATE 75 MG PO TABS
75.0000 mg | ORAL_TABLET | Freq: Every day | ORAL | Status: DC
  Administered 2015-05-08 – 2015-05-09 (×2): 75 mg via ORAL
  Filled 2015-05-08 (×2): qty 1

## 2015-05-08 MED ORDER — SODIUM CHLORIDE 0.9 % IV SOLN: 100.0000 mL | INTRAVENOUS | Status: DC | PRN

## 2015-05-08 MED ORDER — LIDOCAINE-PRILOCAINE 2.5-2.5 % EX CREA
1.0000 "application " | TOPICAL_CREAM | CUTANEOUS | Status: DC | PRN
  Filled 2015-05-08: qty 5

## 2015-05-08 MED ORDER — ALTEPLASE 2 MG IJ SOLR
2.0000 mg | Freq: Once | INTRAMUSCULAR | Status: DC | PRN
  Filled 2015-05-08: qty 2

## 2015-05-08 MED ORDER — PENTAFLUOROPROP-TETRAFLUOROETH EX AERO: 1.0000 "application " | INHALATION_SPRAY | CUTANEOUS | Status: DC | PRN

## 2015-05-08 MED ORDER — LIDOCAINE HCL (PF) 1 % IJ SOLN: 5.0000 mL | INTRAMUSCULAR | Status: DC | PRN

## 2015-05-08 NOTE — Progress Notes (Signed)
[facility] Progress Note  Assessment/Plan: 1. Abdominal pain - HIDA scan stopped due to nausea, no evidence of cholecystitis; showed gallstones with borderline thickening - probably biliary colic with new findings of cirrhosis. Risks outweigh benefits at this point. No surgery - EF 25% 2. ESRD - TTS Had 3 hr HD on Tuesday - time reduced due to large pt volume - on no heparin HD as outpt, unclear why; for HD today 3. Hypertension/volume -BP ok CXR clear on admission 2.5 L on Tuesday - continue to titrate - no post HD weight done - but should have been below EDW baseline on pre HD weight of 66.7; Evaluate for new EDW post HD today 4. Anemia - Hgb stable - continue weekly Fe - due for redose 9/8 5. Metabolic bone disease - Continue calcitriol 6. Nutrition - diet advanced - ate a little breakfast - will ask RD to speak to husband here and also at pt's home HD unit.  Sheffield Slider, PA-C [facility] Beeper [phone] 05/08/2015,12:06 PM  LOS: 2 days   Pt seen, examined and agree w A/P as above.  Vinson Moselle MD pager [phone]    cell [phone] 05/08/2015, 12:35 PM    Subjective:   Husband very frustrated about trying to follow a low fat diet. He is primary caregiver. They get carryout a lot. Wants a guarantee her problem will not flare up again.  Objective Filed Vitals:   05/07/15 1100 05/07/15 1500 05/07/15 2133 05/08/15 0441  BP:  115/61 107/61 129/67  Pulse:  84 74 80  Temp:  98.3 F (36.8 C) 97.6 F (36.4 C) 97.8 F (36.6 C)  TempSrc:  Oral Oral   Resp:  18 18 17   Height:      Weight:      SpO2: 96% 94% 96% 95%   Physical Exam General: NAD slight facial puffiness Heart: RRR with ectopy Lungs: no rales Abdomen: soft NT Extremities: no sig edema Dialysis Access: left upper AVF + bruit  Dialysis Orders: Center:[facility] TTS 160 3.5 EDW 66 400/600 3 K 2.25 Ca pofile 4 Mircera 75 last 8/25 venofer 50 calcitriol 1 left upper AVF Recent labs:  Hgb 10.9 stable 14% sat ferritin 1237 Ca/P ok iPTH 562  Additional Objective Labs: Basic Metabolic Panel:  Recent Labs Lab 05/05/15 1520 05/06/15 0532  NA 132* 133*  K 5.3* 5.0  CL 94* 97*  CO2 23 23  GLUCOSE 179* 78  BUN 41* 47*  CREATININE 4.57* 5.00*  CALCIUM 8.9 8.9   Liver Function Tests:  Recent Labs Lab 05/05/15 1520 05/06/15 0532  AST 36 44*  ALT 19 26  ALKPHOS 79 72  BILITOT 1.1 1.2  PROT 7.0 6.7  ALBUMIN 3.2* 3.2*    Recent Labs Lab 05/05/15 1520  LIPASE 24   CBC:  Recent Labs Lab 05/05/15 1520 05/06/15 0532  WBC 7.0 6.6  NEUTROABS 5.7  --   HGB 10.9* 10.4*  HCT 35.1* 33.7*  MCV 96.7 95.5  PLT 201 198   Blood Culture    Component Value Date/Time   SDES URINE, CATHETERIZED 05/05/2015 1541   SPECREQUEST Normal 05/05/2015 1541   CULT  01/02/2014 2348    NO GROWTH 5 DAYS Performed at [facility]   REPTSTATUS 05/05/2015 FINAL 05/05/2015 1541    Cardiac Enzymes:  Recent Labs Lab 05/05/15 1520 05/05/15 2149  TROPONINI 0.06* 0.07*   CBG:  Recent Labs Lab 05/07/15 1202 05/07/15 1701 05/07/15 2153 05/08/15 0746 05/08/15 1138  GLUCAP 84 90 113* 75 184*  Medications:   . ARIPiprazole  5 mg Oral Daily  . aspirin EC  81 mg Oral Daily  . budesonide-formoterol  2 puff Inhalation BID  . calcitRIOL  1 mcg Oral Q T,Th,Sa-HD  . calcium acetate  667 mg Oral TID WC  . citalopram  40 mg Oral QHS  . clopidogrel  75 mg Oral Daily  . darbepoetin (ARANESP) injection - DIALYSIS  60 mcg Intravenous Q Thu-HD  . docusate sodium  100 mg Oral BID  . fluconazole  100 mg Oral Daily  . heparin  5,000 Units Subcutaneous 3 times per day  . multivitamin  1 tablet Oral QHS  . pantoprazole  80 mg Oral Daily

## 2015-05-08 NOTE — Progress Notes (Signed)
PROGRESS NOTE  Kathleen [name]:[id number] DOB: 07-Dec-1950 DOA: 05/05/2015 PCP: No PCP Per Patient  HPI/Recap of past 24 hours:  tolerating clears, reported right shoulder pain which is chronic from her rheumatoid arthritis. Husband in room   Assessment/Plan: Principal Problem:   Acute calculous cholecystitis Active Problems:   ESRD on dialysis   Diabetes   Chronic systolic heart failure   Coronary artery disease involving coronary bypass graft of native heart with other forms of angina pectoris  1. Biliary colic - CT/ US/HIDA with patent cyst duct, some wall thickening, no fever, no leukocytosis, lft wnl. No evidence of bile duct obstruction, no evidence of LFT elevation Received Unasyn per pharm initially, this is stopped by surgery due to low probability of cholecystitis.       General surgery consulted, request cardiology consult for preop eval for lap chole, not cleared for surgery due to high risk for CV complication and recent bilateral lower extremity PCI/stent (July/august/2016), patient should not stop plavix.      Advance diet, if tolerating diet, likely will discharge home tomorrow.  2.      ESRD/TTS dialysis - nephrology input appreciated 3. DM - appears to be diet controlled, last a1c  6.3 in 02/2015 4. H/o CHF last EF 25-30%, volume managed by HD, cardiology requested by surgery for pre op eval 5. H/o COPD, stable, quit smoking in 2010. Not on home oxygen, reported baseline limited by DOE with walking from one room to another, baseline intermittent wheezing, no wheezing since in the hospital, on symbicort, has not  Require prn nebs.  Code Status: full  Family Communication: patient and husband  Disposition Plan: discharge home on 9/9, likely will need home health   Consultants:  gen surg  Cardiology ( gen surg requested for preop eval)  Nephrology   Procedures:  HD  Antibiotics:  unasyn from admission to 9/8   Objective: BP 130/58 mmHg  Pulse 85   Temp(Src) 97.9 F (36.6 C) (Oral)  Resp 18  Ht  (1.651 m)  Wt 148 lb 9.4 oz (67.4 kg)  BMI 24.73 kg/m2  SpO2 96%  Intake/Output Summary (Last 24 hours) at 05/08/15 1512 Last data filed at 05/08/15 1153  Gross per 24 hour  Intake    350 ml  Output      0 ml  Net    350 ml   Filed Weights   05/06/15 0139 05/06/15 0146 05/06/15 1945  Weight: 147 lb 4.3 oz (66.8 kg) 147 lb 0.8 oz (66.7 kg) 148 lb 9.4 oz (67.4 kg)    Exam:   General:  NAD  Cardiovascular: RRR, with ectopic beats  Respiratory: CTABL  Abdomen: Soft/ND/NT, positive BS  Musculoskeletal: No Edema  Neuro: aaox3  Data Reviewed: Basic Metabolic Panel:  Recent Labs Lab 05/05/15 1520 05/06/15 0532  NA 132* 133*  K 5.3* 5.0  CL 94* 97*  CO2 23 23  GLUCOSE 179* 78  BUN 41* 47*  CREATININE 4.57* 5.00*  CALCIUM 8.9 8.9   Liver Function Tests:  Recent Labs Lab 05/05/15 1520 05/06/15 0532  AST 36 44*  ALT 19 26  ALKPHOS 79 72  BILITOT 1.1 1.2  PROT 7.0 6.7  ALBUMIN 3.2* 3.2*    Recent Labs Lab 05/05/15 1520  LIPASE 24   No results for input(s): AMMONIA in the last 168 hours. CBC:  Recent Labs Lab 05/05/15 1520 05/06/15 0532  WBC 7.0 6.6  NEUTROABS 5.7  --   HGB 10.9* 10.4*  HCT 35.1* 33.7*  MCV 96.7 95.5  PLT 201 198   Cardiac Enzymes:    Recent Labs Lab 05/05/15 1520 05/05/15 2149  TROPONINI 0.06* 0.07*   BNP (last 3 results)  Recent Labs  05/05/15 1520  BNP >4500.0*    ProBNP (last 3 results) No results for input(s): PROBNP in the last 8760 hours.  CBG:  Recent Labs Lab 05/07/15 1202 05/07/15 1701 05/07/15 2153 05/08/15 0746 05/08/15 1138  GLUCAP 84 90 113* 75 184*    Recent Results (from the past 240 hour(s))  Gram stain     Status: None   Collection Time: 05/05/15  3:41 PM  Result Value Ref Range Status   Specimen Description URINE, CATHETERIZED  Final   Special Requests Normal  Final   Gram Stain   Final    WBC PRESENT, PREDOMINANTLY  MONONUCLEAR NO ORGANISMS SEEN SQUAMOUS EPITHELIAL CELLS PRESENT CYTOSPIN    Report Status 05/05/2015 FINAL  Final  MRSA PCR Screening     Status: None   Collection Time: 05/07/15 12:51 PM  Result Value Ref Range Status   MRSA by PCR NEGATIVE NEGATIVE Final    Comment:        The GeneXpert MRSA Assay (FDA approved for NASAL specimens only), is one component of a comprehensive MRSA colonization surveillance program. It is not intended to diagnose MRSA infection nor to guide or monitor treatment for MRSA infections.      Studies: No results found.  Scheduled Meds: . ARIPiprazole  5 mg Oral Daily  . aspirin EC  81 mg Oral Daily  . budesonide-formoterol  2 puff Inhalation BID  . calcitRIOL  1 mcg Oral Q T,Th,Sa-HD  . calcium acetate  667 mg Oral TID WC  . citalopram  40 mg Oral QHS  . clopidogrel  75 mg Oral Daily  . darbepoetin (ARANESP) injection - DIALYSIS  60 mcg Intravenous Q Thu-HD  . docusate sodium  100 mg Oral BID  . heparin  5,000 Units Subcutaneous 3 times per day  . multivitamin  1 tablet Oral QHS  . pantoprazole  80 mg Oral Daily    Continuous Infusions:    Time spent:  Herron Fero MD, PhD  [facility] Pager [phone]. If 7PM-7AM, please contact night-coverage at [email or web address], password [facility] 05/08/2015, 3:12 PM  LOS: 2 days

## 2015-05-08 NOTE — Progress Notes (Addendum)
PT Cancellation and Discharge Note  Patient Details Name: ERMINIE MCELMURRAY MRN: [id number] DOB: [birth year]   Cancelled Treatment:    Reason Eval/Treat Not Completed: Patient declined, no reason specified Patient declines to work with physical therapy on this admission. States that she feels fine but she will not be working with PT. Also declines offer for PT to check back with her tomorrow. Will sign-off at this time.  Berton Mount 05/08/2015, 3:15 PM Charlsie Merles, [place] [phone]

## 2015-05-08 NOTE — Care Management Note (Signed)
Case Management Note  Patient Details  Name: Kathleen [name] MRN: [id number] Date of Birth: 10-24-1950  Subjective/Objective:                    Action/Plan: Awaiting PT/OT evals   Expected Discharge Date:  05/09/15               Expected Discharge Plan:  Home/Self Care  In-House Referral:     Discharge planning Services  CM Consult  Post Acute Care Choice:  Home Health Choice offered to:     DME Arranged:    DME Agency:     HH Arranged:    HH Agency:     Status of Service:  In process, will continue to follow  Medicare Important Message Given:    Date Medicare IM Given:    Medicare IM give by:    Date Additional Medicare IM Given:    Additional Medicare Important Message give by:     If discussed at Long Length of Stay Meetings, dates discussed:    Additional Comments:  Kingsley Plan, RN 05/08/2015, 2:56 PM

## 2015-05-08 NOTE — Progress Notes (Signed)
Patient Name: Kathleen [name] Date of Encounter: 05/08/2015   SUBJECTIVE  Feels better. Tolerated solid this AM. Plavix restarted. Surgery cancelled as she does not have cholecystitis, likely biliary colic with new finding of cirrhosis. Denies chest pain, sob or palpitation.   CURRENT MEDS . ARIPiprazole  5 mg Oral Daily  . aspirin EC  81 mg Oral Daily  . budesonide-formoterol  2 puff Inhalation BID  . calcitRIOL  1 mcg Oral Q T,Th,Sa-HD  . calcium acetate  667 mg Oral TID WC  . citalopram  40 mg Oral QHS  . clopidogrel  75 mg Oral Daily  . darbepoetin (ARANESP) injection - DIALYSIS  60 mcg Intravenous Q Thu-HD  . docusate sodium  100 mg Oral BID  . fluconazole  100 mg Oral Daily  . heparin  5,000 Units Subcutaneous 3 times per day  . multivitamin  1 tablet Oral QHS  . pantoprazole  80 mg Oral Daily    OBJECTIVE  Filed Vitals:   05/07/15 1100 05/07/15 1500 05/07/15 2133 05/08/15 0441  BP:  115/61 107/61 129/67  Pulse:  84 74 80  Temp:  98.3 F (36.8 C) 97.6 F (36.4 C) 97.8 F (36.6 C)  TempSrc:  Oral Oral   Resp:  Height:      Weight:      SpO2: 96% 94% 96% 95%    Intake/Output Summary (Last 24 hours) at 05/08/15 1026 Last data filed at 05/08/15 0911  Gross per 24 hour  Intake    480 ml  Output      0 ml  Net    480 ml   Filed Weights   05/06/15 0139 05/06/15 0146 05/06/15 1945  Weight: 147 lb 4.3 oz (66.8 kg) 147 lb 0.8 oz (66.7 kg) 148 lb 9.4 oz (67.4 kg)    PHYSICAL EXAM  General: Pleasant, NAD. Neuro: Alert and oriented X 3. Moves all extremities spontaneously. Psych: Normal affect. HEENT:  Normal  Neck: Supple without bruits or JVD. Lungs:  Resp regular and unlabored, CTA. Heart: RRR no s3, s4, or murmurs. Abdomen: Soft, non-tender, non-distended, BS + x 4.  Extremities: No clubbing, cyanosis or edema. DP/PT/Radials 2+ and equal bilaterally.  Accessory Clinical Findings  CBC  Recent Labs  05/05/15 1520 05/06/15 0532  WBC 7.0 6.6   NEUTROABS 5.7  --   HGB 10.9* 10.4*  HCT 35.1* 33.7*  MCV 96.7 95.5  PLT 201 198   Basic Metabolic Panel  Recent Labs  05/05/15 1520 05/06/15 0532  NA 132* 133*  K 5.3* 5.0  CL 94* 97*  CO2 23 23  GLUCOSE 179* 78  BUN 41* 47*  CREATININE 4.57* 5.00*  CALCIUM 8.9 8.9   Liver Function Tests  Recent Labs  05/05/15 1520 05/06/15 0532  AST 36 44*  ALT 19 26  ALKPHOS 79 72  BILITOT 1.1 1.2  PROT 7.0 6.7  ALBUMIN 3.2* 3.2*    Recent Labs  05/05/15 1520  LIPASE 24   Cardiac Enzymes  Recent Labs  05/05/15 1520 05/05/15 2149  TROPONINI 0.06* 0.07*     ASSESSMENT AND PLAN  1. Preoperative clearance for Acute cholecystitis  - She is at high risk for CV complication for surgery. Surgery got cancelled as she does not have cholecystitis.   2. CAD s/p CABG 2010 - Not a lot of record on location of previous CABG or previous PCI, however no recent symptoms to suggest angina. Continue current regimen. Mild elevated troponin  likely demand.   3. ESRD on HD T/TH/Sat via L arm AVF placed in 2015 by Dr. Sondra Come at [facility] - HD today  4. PAD s/p PCI to bilateral LE in [place]/Aug of 2016 -  she had RLE stent placed in [place] in July and LLE stent placement on 8/19 - Plavix restarted. Continue DAPT and f/u with primary cardiologist post discharge.  5. Mildly acute on chronic systolic HF with baseline EF 20% - mild bibasilar rale on exam, HD today.   6. Moderate to severe MR - Echo on 11/13/2013 showed severe MR, repeat echo on 03/05/2015 showed moderate MR.    Signed, Bhagat,Bhavinkumar PA-C As above, patient seen and examined; no chest pain or dyspnea; per Dr Katrinka Blazing, pt high risk for any surgical procedure; cholecystectomy has been canceled; further management per surgery. Would continue ASA and plavix at DC. Elevated troponin not c/w ACS. FU with her primary cardiologist following DC; please call with questions. Olga Millers

## 2015-05-08 NOTE — Progress Notes (Signed)
Patient ID: Kathleen [name], female   DOB: 07-09-1951, 64 y.o.   MRN: [id number]    Subjective: Pt feels ok today.  Tolerating clear liquids yesterday and wanted more to eat  Objective: Vital signs in last 24 hours: Temp:  [97.6 F (36.4 C)-98.3 F (36.8 C)] 97.8 F (36.6 C) (09/08 0441) Pulse Rate:  [74-84] 80 (09/08 0441) Resp:  [17-18] 17 (09/08 0441) BP: (107-129)/(61-67) 129/67 mmHg (09/08 0441) SpO2:  [94 %-96 %] 95 % (09/08 0441) Last BM Date: 05/05/15  Intake/Output from previous day: 09/07 0701 - 09/08 0700 In: 480 [P.O.:480] Out: -  Intake/Output this shift:    PE: Abd: soft, NT, ND, +BS  Lab Results:   Recent Labs  05/05/15 1520 05/06/15 0532  WBC 7.0 6.6  HGB 10.9* 10.4*  HCT 35.1* 33.7*  PLT 201 198   BMET  Recent Labs  05/05/15 1520 05/06/15 0532  NA 132* 133*  K 5.3* 5.0  CL 94* 97*  CO2 23 23  GLUCOSE 179* 78  BUN 41* 47*  CREATININE 4.57* 5.00*  CALCIUM 8.9 8.9   PT/INR No results for input(s): LABPROT, INR in the last 72 hours. CMP     Component Value Date/Time   NA 133* 05/06/2015 0532   K 5.0 05/06/2015 0532   CL 97* 05/06/2015 0532   CO2 23 05/06/2015 0532   GLUCOSE 78 05/06/2015 0532   BUN 47* 05/06/2015 0532   CREATININE 5.00* 05/06/2015 0532   CALCIUM 8.9 05/06/2015 0532   PROT 6.7 05/06/2015 0532   ALBUMIN 3.2* 05/06/2015 0532   AST 44* 05/06/2015 0532   ALT 26 05/06/2015 0532   ALKPHOS 72 05/06/2015 0532   BILITOT 1.2 05/06/2015 0532   GFRNONAA 8* 05/06/2015 0532   GFRAA 10* 05/06/2015 0532   Lipase     Component Value Date/Time   LIPASE 24 05/05/2015 1520       Studies/Results: Nm Hepatobiliary Liver Func  05/06/2015   CLINICAL DATA:  Right upper quadrant pain with nausea and vomiting. Gallstones. Evaluate for acute cholecystitis.  EXAM: NUCLEAR MEDICINE HEPATOBILIARY IMAGING  TECHNIQUE: Sequential images of the abdomen were obtained out to 60 minutes following intravenous administration of  radiopharmaceutical.  RADIOPHARMACEUTICALS:  5.1 mCi Tc-65m  Choletec IV  COMPARISON:  None.  FINDINGS: Prompt and homogeneous uptake into the liver. Prominent blood pool in this patient with suspected cirrhosis.  Central bile ducts seen by 25 min and the gallbladder by 30 min. No tracer in the bowel at 60 min, when patient terminated the examination.  IMPRESSION: 1. Patent cystic duct. 2. No bowel activity at 60 min, cannot confirm common bile duct patency. Patient terminated exam before delayed imaging could be acquired.   Electronically Signed   By: Marnee Spring M.D.   On: 05/06/2015 10:06    Anti-infectives: Anti-infectives    Start     Dose/Rate Route Frequency Ordered Stop   05/08/15 0600  Ampicillin-Sulbactam (UNASYN) 3 g in sodium chloride 0.9 % 100 mL IVPB  Status:  Discontinued     3 g 100 mL/hr over 60 Minutes Intravenous On call to O.R. 05/07/15 0817 05/07/15 0825   05/07/15 0830  Ampicillin-Sulbactam (UNASYN) 3 g in sodium chloride 0.9 % 100 mL IVPB     3 g 100 mL/hr over 60 Minutes Intravenous To [facility] 05/07/15 0825 05/08/15 0830   05/06/15 1000  fluconazole (DIFLUCAN) tablet 100 mg     100 mg Oral Daily 05/06/15 0008     05/06/15  0130  ampicillin-sulbactam (UNASYN) 1.5 g in sodium chloride 0.9 % 50 mL IVPB  Status:  Discontinued     1.5 g 100 mL/hr over 30 Minutes Intravenous 2 times daily 05/06/15 0053 05/07/15 0817       Assessment/Plan  1. Symptomatic cholelithiasis -greatly appreciate cardiology evaluation of this patient.  She has been deemed high risk for surgical intervention.  Her plavix has been restarted given her new stent.  She does not have cholecystitis, likely only biliary colic; however new findings of cirrhosis may contribute some as well.  Given this is not an urgent or emergent finding that pushes [place] into an operation, we will not proceed with surgical intervention at this time.  Her risks do not outweigh her benefit currently. -I have d/w  the patient and she understands that we will have to symptomatically control her for now with medications and a low fat diet to help prevent symptoms.  She was very appreciative and understanding. -low fat diet -no abx therapy given she doesn't have cholecystitis 2. ESRD, HD -per primary and renal 3. CHF, EF 25% -appreciate cards input   LOS: 2 days    Elliotte Marsalis E 05/08/2015, 8:44 AM Pager: [phone]

## 2015-05-09 DIAGNOSIS — J449 Chronic obstructive pulmonary disease, unspecified: Secondary | ICD-10-CM

## 2015-05-09 DIAGNOSIS — R627 Adult failure to thrive: Secondary | ICD-10-CM

## 2015-05-09 DIAGNOSIS — R52 Pain, unspecified: Secondary | ICD-10-CM

## 2015-05-09 LAB — GLUCOSE, CAPILLARY
GLUCOSE-CAPILLARY: 162 mg/dL — AB (ref 65–99)
Glucose-Capillary: 132 mg/dL — ABNORMAL HIGH (ref 65–99)

## 2015-05-09 MED ORDER — DARBEPOETIN ALFA 60 MCG/0.3ML IJ SOSY
PREFILLED_SYRINGE | INTRAMUSCULAR | Status: AC
  Filled 2015-05-09: qty 0.3

## 2015-05-09 NOTE — Discharge Instructions (Signed)
Low-Fat Diet for Pancreatitis or Gallbladder Conditions °A low-fat diet can be helpful if you have pancreatitis or a gallbladder condition. With these conditions, your pancreas and gallbladder have trouble digesting fats. A healthy eating plan with less fat will help rest your pancreas and gallbladder and reduce your symptoms. °WHAT DO I NEED TO KNOW ABOUT THIS DIET? °· Eat a low-fat diet. °¨ Reduce your fat intake to less than 20-30% of your total daily calories. This is less than 50-60 g of fat per day. °¨ Remember that you need some fat in your diet. Ask your dietician what your daily goal should be. °¨ Choose nonfat and low-fat healthy foods. Look for the words "nonfat," "low fat," or "fat free." °¨ As a guide, look on the label and choose foods with less than 3 g of fat per serving. Eat only one serving. °· Avoid alcohol. °· Do not smoke. If you need help quitting, talk with your health care provider. °· Eat small frequent meals instead of three large heavy meals. °WHAT FOODS CAN I EAT? °Grains °Include healthy grains and starches such as potatoes, wheat bread, fiber-rich cereal, and brown rice. Choose whole grain options whenever possible. In adults, whole grains should account for 45-65% of your daily calories.  °Fruits and Vegetables °Eat plenty of fruits and vegetables. Fresh fruits and vegetables add fiber to your diet. °Meats and Other Protein Sources °Eat lean meat such as chicken and pork. Trim any fat off of meat before cooking it. Eggs, fish, and beans are other sources of protein. In adults, these foods should account for 10-35% of your daily calories. °Dairy °Choose low-fat milk and dairy options. Dairy includes fat and protein, as well as calcium.  °Fats and Oils °Limit high-fat foods such as fried foods, sweets, baked goods, sugary drinks.  °Other °Creamy sauces and condiments, such as mayonnaise, can add extra fat. Think about whether or not you need to use them, or use smaller amounts or low fat  options. °WHAT FOODS ARE NOT RECOMMENDED? °· High fat foods, such as: °¨ Baked goods. °¨ Ice cream. °¨ French toast. °¨ Sweet rolls. °¨ Pizza. °¨ Cheese bread. °¨ Foods covered with batter, butter, creamy sauces, or cheese. °¨ Fried foods. °¨ Sugary drinks and desserts. °· Foods that cause gas or bloating °Document Released: 08/21/2013 Document Reviewed: 08/21/2013 °ExitCare® Patient Information ©2015 [employer]. This information is not intended to replace advice given to you by your health care provider. Make sure you discuss any questions you have with your health care provider. ° °

## 2015-05-09 NOTE — Progress Notes (Signed)
Pt discharged to home.  Discharge instructions explained to pt.  Pt has no questions at the time of discharge.  Pt states she has all belongings.  IV dc'd.  Pt wheeled off the floor by volunteer services.

## 2015-05-09 NOTE — Care Management Important Message (Signed)
Important Message  Patient Details  Name: Kathleen [name] MRN: [id number] Date of Birth: 10/06/1950   Medicare Important Message Given:  Yes-second notification given    Orson Aloe 05/09/2015, 11:55 AM

## 2015-05-09 NOTE — Progress Notes (Signed)
[facility] Progress Note  Assessment/Plan: 1. Abdominal pain - for dc today, no surgery planned for now 2. ESRD - TTS Had 3 hr HD on Tuesday - time reduced due to large pt volume - on no heparin HD as outpt, unclear why; for HD today 3. Hypertension/volume -BP ok CXR clear on admission 2.5 L on Tuesday - continue to titrate - no post HD weight done - but should have been below EDW baseline on pre HD weight of 66.7; Evaluate for new EDW post HD today 4. Anemia - Hgb stable - continue weekly Fe - due for redose 9/8 5. Metabolic bone disease - Continue calcitriol 6. Nutrition - diet advanced - ate a little breakfast - will ask RD to speak to husband here and also at pt's home HD unit 7. PVD - s/p bilat LE PCI this summer, back on plavix now that surgery is off the schedule 8. Dispo - for [employer] home today  Vinson Moselle MD pager [phone]    cell [phone] 05/09/2015, 12:49 PM    Subjective:  No compalints  Objective Filed Vitals:   05/09/15 0115 05/09/15 0139 05/09/15 0223 05/09/15 0652  BP: 133/66 127/68 131/66 127/55  Pulse: 81 84 86 78  Temp:  98 F (36.7 C) 97.7 F (36.5 C) 98.1 F (36.7 C)  TempSrc:  Oral Oral   Resp:  17 17 16   Height:      Weight:  61.9 kg (136 lb 7.4 oz)    SpO2:  96% 94% 95%   Physical Exam General: NAD slight facial puffiness Heart: RRR with ectopy Lungs: no rales Abdomen: soft NT Extremities: no sig edema Dialysis Access: left upper AVF + bruit  Dialysis Orders: Center:[facility] 160 3.5 EDW 66 400/600 3 K 2.25 Ca pofile 4 Mircera 75 last 8/25 venofer 50 calcitriol 1 left upper AVF Recent labs: Hgb 10.9 stable 14% sat ferritin 1237 Ca/P ok iPTH 562  Additional Objective Labs: Basic Metabolic Panel:  Recent Labs Lab 05/05/15 1520 05/06/15 0532 05/08/15 2331  NA 132* 133* 130*  K 5.3* 5.0 4.4  CL 94* 97* 94*  CO2 23 23 25   GLUCOSE 179* 78 164*  BUN 41* 47* 23*  CREATININE 4.57* 5.00* 3.40*  CALCIUM 8.9 8.9 8.5*   PHOS  --   --  3.0   Liver Function Tests:  Recent Labs Lab 05/05/15 1520 05/06/15 0532 05/08/15 2331  AST 36 44*  --   ALT 19 26  --   ALKPHOS 79 72  --   BILITOT 1.1 1.2  --   PROT 7.0 6.7  --   ALBUMIN 3.2* 3.2* 3.1*    Recent Labs Lab 05/05/15 1520  LIPASE 24   CBC:  Recent Labs Lab 05/05/15 1520 05/06/15 0532 05/08/15 2332  WBC 7.0 6.6 5.9  NEUTROABS 5.7  --   --   HGB 10.9* 10.4* 10.9*  HCT 35.1* 33.7* 34.7*  MCV 96.7 95.5 95.6  PLT 201 198 143*   Blood Culture    Component Value Date/Time   SDES URINE, CATHETERIZED 05/05/2015 1541   SPECREQUEST Normal 05/05/2015 1541   CULT  01/02/2014 2348    NO GROWTH 5 DAYS Performed at [facility]   REPTSTATUS 05/05/2015 FINAL 05/05/2015 1541    Cardiac Enzymes:  Recent Labs Lab 05/05/15 1520 05/05/15 2149  TROPONINI 0.06* 0.07*   CBG:  Recent Labs Lab 05/08/15 0746 05/08/15 1138 05/08/15 1705 05/09/15 0742 05/09/15 1208  GLUCAP 75 184* 197* 132*  162*  Medications:   . ARIPiprazole  5 mg Oral Daily  . aspirin EC  81 mg Oral Daily  . budesonide-formoterol  2 puff Inhalation BID  . calcitRIOL  1 mcg Oral Q T,Th,Sa-HD  . calcium acetate  667 mg Oral TID WC  . citalopram  40 mg Oral QHS  . clopidogrel  75 mg Oral Daily  . darbepoetin (ARANESP) injection - DIALYSIS  60 mcg Intravenous Q Thu-HD  . docusate sodium  100 mg Oral BID  . heparin  5,000 Units Subcutaneous 3 times per day  . multivitamin  1 tablet Oral QHS  . pantoprazole  80 mg Oral Daily

## 2015-05-09 NOTE — Care Management Note (Signed)
Case Management Note  Patient Details  Name: Kathleen [name] MRN: [id number] Date of Birth: 15-Feb-1951  Subjective/Objective:                    Action/Plan:  Discussed home health PT with patient . Patient declined.  Expected Discharge Date:  05/09/15               Expected Discharge Plan:  Home/Self Care  In-House Referral:     Discharge planning Services  CM Consult  Post Acute Care Choice:  Home Health Choice offered to:     DME Arranged:    DME Agency:     HH Arranged:  Patient Refused HH Agency:     Status of Service:  Completed, signed off  Medicare Important Message Given:    Date Medicare IM Given:    Medicare IM give by:    Date Additional Medicare IM Given:    Additional Medicare Important Message give by:     If discussed at Long Length of Stay Meetings, dates discussed:    Additional Comments:  Kingsley Plan, RN 05/09/2015, 10:20 AM

## 2015-05-09 NOTE — Evaluation (Signed)
Occupational Therapy Evaluation Patient Details Name: Kathleen [name] MRN: [id number] DOB: 1951/03/16 Today's Date: 05/09/2015    History of Present Illness 64 yo female admitted R [facility] with bilary colic. PMH: ESRD, HTN, anemia, breast CA s/p L mastectomy   Clinical Impression   Patient evaluated by Occupational Therapy with no further acute OT needs identified. All education has been completed and the patient has no further questions. See below for any follow-up Occupational Therapy or equipment needs. OT to sign off. Thank you for referral.      Follow Up Recommendations  No OT follow up    Equipment Recommendations  None recommended by OT    Recommendations for Other Services       Precautions / Restrictions Precautions Precautions: Fall      Mobility Bed Mobility Overal bed mobility: Modified Independent                Transfers Overall transfer level: Needs assistance Equipment used: Rolling walker (2 wheeled) Transfers: Sit to/from Stand Sit to Stand: Supervision         General transfer comment: at baseline    Balance                                            ADL Overall ADL's : At baseline                                       General ADL Comments: pt able to retrieve clothing from closet in room and dress using EC technique of sitting and standing to pull all clothing at one time. pt complete bed mobility without (A)     Vision     Perception     Praxis      Pertinent Vitals/Pain Pain Assessment: No/denies pain     Hand Dominance Right   Extremity/Trunk Assessment Upper Extremity Assessment Upper Extremity Assessment: Overall WFL for tasks assessed   Lower Extremity Assessment Lower Extremity Assessment: Defer to PT evaluation   Cervical / Trunk Assessment Cervical / Trunk Assessment: Normal   Communication Communication Communication: No difficulties   Cognition Arousal/Alertness:  Awake/alert Behavior During Therapy: WFL for tasks assessed/performed Overall Cognitive Status: Within Functional Limits for tasks assessed                     General Comments       Exercises       Shoulder Instructions      Home Living Family/patient expects to be discharged to:: Private residence Living Arrangements: Spouse/significant other Available Help at Discharge: Family;Available 24 hours/day Type of Home: House Home Access: Ramped entrance     Home Layout: One level     Bathroom Shower/Tub: Other (comment) (sponge bath)   Bathroom Toilet: Standard     Home Equipment: Walker - 2 wheels;Bedside commode;Wheelchair - manual          Prior Functioning/Environment Level of Independence: Independent with assistive device(s);Needs assistance  Gait / Transfers Assistance Needed: Ambulated short distances with RW.  Used w/c primarily during day. ADL's / Homemaking Assistance Needed: Assist with meal prep, housekeeping, and set-up for sponge bath.        OT Diagnosis:     OT Problem List:     OT Treatment/Interventions:  OT Goals(Current goals can be found in the care plan section) Acute Rehab OT Goals Patient Stated Goal: to go home  OT Frequency:     Barriers to D/C:            Co-evaluation              End of Session Equipment Utilized During Treatment: Gait belt;Rolling walker Nurse Communication: Mobility status;Precautions  Activity Tolerance: Patient tolerated treatment well Patient left: in bed;with call bell/phone within reach   Time: 1052-1109 OT Time Calculation (min): 17 min Charges:  OT General Charges $OT Visit: 1 Procedure OT Evaluation $Initial OT Evaluation Tier I: 1 Procedure G-Codes:    Kathleen [name] May 29, 2015, 12:21 PM   Mateo Flow   OTR/L Pager: [phone] Office: [phone] .

## 2015-05-09 NOTE — Discharge Summary (Addendum)
Discharge Summary  Kathleen [name]:[id number] DOB: Feb 27, 1951  PCP: No PCP Per Patient  Admit date: 05/05/2015 Discharge date: 05/09/2015  Time spent: >62mins  Recommendations for Outpatient Follow-up:  1. F/u with PMD within a week for hospital discharge follow up 2. F/u with nephrology , continue dialysis TTS. 3. F/u with primary cardiology fro cad s/p CABG 2010, CHF ( baseline EF 20%) and PAD s/p bilateral LE stent (RLE 02/2015), LLE (03/2015), continue dual antiplatelet therapy. 4. F/u with general surgery for biliary colic as needed, currently diet modification and symptom management recommended by surgery due to prohibtiive cardiac risk  Discharge Diagnoses:  Active Hospital Problems   Diagnosis Date Noted  . Acute calculous cholecystitis 05/06/2015  . Biliary colic   . Coronary artery disease involving coronary bypass graft of native heart with other forms of angina pectoris   . Chronic systolic heart failure 01/01/2014  . ESRD on dialysis 11/11/2013  . Diabetes 11/11/2013    Resolved Hospital Problems   Diagnosis Date Noted Date Resolved  No resolved problems to display.    Discharge Condition: stable  Diet recommendation: heart healthy/carb modified/low fat/renal diet  Filed Weights   05/06/15 1945 05/08/15 2200 05/09/15 0139  Weight: 148 lb 9.4 oz (67.4 kg) 142 lb 3.2 oz (64.5 kg) 136 lb 7.4 oz (61.9 kg)    History of present illness:  Kathleen [name] is a 64 y.o. female with h/o ESRD dialysis TTS, CHF with EF 25-30% a couple of months ago on echo. Patient presents to ED with c/o RUQ abdominal pain, N/V. Symptoms ongoing for past 4 days. Worsening despite trying keflex prescribed by UC. Does have SOB, orthopnea, and cough which is unchanged from baseline. Nausea is constant. Vomiting is NBNB.  Hospital Course:  Principal Problem:   Acute calculous cholecystitis Active Problems:   ESRD on dialysis   Diabetes   Chronic systolic heart failure   Coronary  artery disease involving coronary bypass graft of native heart with other forms of angina pectoris   Biliary colic  1. Biliary colic - CT/ US/HIDA with patent cyst duct, some wall thickening, no fever, no leukocytosis, lft wnl. No evidence of bile duct obstruction, no evidence of LFT elevation Received Unasyn per pharm initially, this is stopped by surgery due to low probability of cholecystitis.  General surgery consulted, request cardiology consult for preop eval for lap chole, not cleared for surgery due to high risk for CV complication and recent bilateral lower extremity PCI/stent (July/august/2016), patient should not stop plavix/asa.  patient tolerated diet advancement, discharged home 2. ESRD/TTS dialysis - nephrology input appreciated 3. DM - appears to be diet controlled, last a1c 6.3 in 02/2015 4. Acute on Chronic systolic CHF last EF 25-30%, did have few crackles on exam, volume managed by HD, cardiology requested by surgery for pre op eval 5. CAD s/p CABG 2010, PVD s/p bilateral lower extremity stent in July and august 2016, continue dual antiplatelet therapy, continue outpatient follow up with cardiology 6. H/o COPD, stable, quit smoking in 2010. Not on home oxygen, reported baseline limited by DOE with walking from one room to another, baseline intermittent wheezing, no wheezing since in the hospital, on symbicort, has not Require prn nebs. 7. FTT/deconditioning: recommend home health/PT.   Code Status: full  Family Communication: patient and husband  Disposition Plan: discharge home on 9/9, recommend home health but patient refused.   Consultants:  gen surg  Cardiology ( gen surg requested for preop eval)  Nephrology  Procedures:  HD  Antibiotics:  unasyn from admission to 9/8  Discharge Exam: BP 127/55 mmHg  Pulse 78  Temp(Src) 98.1 F (36.7 C) (Oral)  Resp 16  Ht 5\' 5"  (1.651 m)  Wt 136 lb 7.4 oz (61.9 kg)  BMI 22.71 kg/m2  SpO2  95%   General: NAD  Cardiovascular: RRR, with ectopic beats  Respiratory: CTABL  Abdomen: Soft/ND/NT, positive BS  Musculoskeletal: No Edema  Neuro: aaox3   Discharge Instructions You were cared for by a hospitalist during your hospital stay. If you have any questions about your discharge medications or the care you received while you were in the hospital after you are discharged, you can call the unit and asked to speak with the hospitalist on call if the hospitalist that took care of you is not available. Once you are discharged, your primary care physician will handle any further medical issues. Please note that NO REFILLS for any discharge medications will be authorized once you are discharged, as it is imperative that you return to your primary care physician (or establish a relationship with a primary care physician if you do not have one) for your aftercare needs so that they can reassess your need for medications and monitor your lab values.  Please call your primary care doctor or go to the nearest Emergency room if your have abdominal pain, nausea, vomiting with fever.  Discharge Instructions    Diet - low sodium heart healthy    Complete by:  As directed   Renal diet, low fat diet     Increase activity slowly    Complete by:  As directed             Medication List    STOP taking these medications        cephALEXin 500 MG capsule  Commonly known as:  KEFLEX     fluconazole 100 MG tablet  Commonly known as:  DIFLUCAN      TAKE these medications        ARIPiprazole 5 MG tablet  Commonly known as:  ABILIFY  Take 5 mg by mouth daily.     aspirin 81 MG tablet  Take 81 mg by mouth daily.     b complex-vitamin c-folic acid 0.8 MG Tabs tablet  Take 1 tablet by mouth Every Tuesday,Thursday,and Saturday with dialysis.     budesonide-formoterol 160-4.5 MCG/ACT inhaler  Commonly known as:  SYMBICORT  Inhale 2 puffs into the lungs 2 (two) times daily.      calcitRIOL 0.5 MCG capsule  Commonly known as:  ROCALTROL  Take 2 capsules (1 mcg total) by mouth Every Tuesday,Thursday,and Saturday with dialysis.     calcium acetate 667 MG tablet  Commonly known as:  PHOSLO  Take 1 tablet by mouth 3 (three) times daily with meals.     citalopram 40 MG tablet  Commonly known as:  CELEXA  Take 40 mg by mouth at bedtime.     clopidogrel 75 MG tablet  Commonly known as:  PLAVIX  Take 75 mg by mouth daily.     docusate sodium 100 MG capsule  Commonly known as:  COLACE  Take 100 mg by mouth 2 (two) times daily.     glycerin adult 2 G Supp  Generic drug:  glycerin adult  Place 1 suppository rectally daily as needed for moderate constipation.     ipratropium-albuterol 0.5-2.5 (3) MG/3ML Soln  Commonly known as:  DUONEB  Take 3 mLs by nebulization 2 (  two) times daily as needed (shortness of breath).     LORazepam 0.5 MG tablet  Commonly known as:  ATIVAN  Take 1 tablet (0.5 mg total) by mouth 2 (two) times daily as needed for anxiety.     NATURAL VEGETABLE LAXATIVE PO  Take 1 tablet by mouth 2 (two) times daily.     nitroGLYCERIN 0.4 MG SL tablet  Commonly known as:  NITROSTAT  Place 0.4 mg under the tongue every 5 (five) minutes as needed for chest pain.     omeprazole 40 MG capsule  Commonly known as:  PRILOSEC  Take 40 mg by mouth daily.     ondansetron 4 MG tablet  Commonly known as:  ZOFRAN  Take 4 mg by mouth.     oxyCODONE-acetaminophen 5-325 MG per tablet  Commonly known as:  PERCOCET/ROXICET  Take 1 tablet by mouth every 4 (four) hours as needed for moderate pain. pain       Allergies  Allergen Reactions  . Statins     Side effects of leg pain and aching with "cholesterol medications", pt requested that no statins be given to her from this time forward  . Levaquin [Levofloxacin In D5w] Hives  . Metformin And Related Diarrhea  . Other Other (See Comments)    Mycins allergy, [employer] feeding supplement causes high  blood sugar   . Sulfa Antibiotics Hives  . Tobradex [Tobramycin-Dexamethasone] Other (See Comments)    redness       Follow-up Information    Follow up with Liz Malady, MD.   Specialty:  General Surgery   Why:  [facility], As needed   Contact information:   [address] [place] [address] [phone]        The results of significant diagnostics from this hospitalization (including imaging, microbiology, ancillary and laboratory) are listed below for reference.    Significant Diagnostic Studies: Nm Hepatobiliary Liver Func  05/06/2015   CLINICAL DATA:  Right upper quadrant pain with nausea and vomiting. Gallstones. Evaluate for acute cholecystitis.  EXAM: NUCLEAR MEDICINE HEPATOBILIARY IMAGING  TECHNIQUE: Sequential images of the abdomen were obtained out to 60 minutes following intravenous administration of radiopharmaceutical.  RADIOPHARMACEUTICALS:  5.1 mCi Tc-58m  Choletec IV  COMPARISON:  None.  FINDINGS: Prompt and homogeneous uptake into the liver. Prominent blood pool in this patient with suspected cirrhosis.  Central bile ducts seen by 25 min and the gallbladder by 30 min. No tracer in the bowel at 60 min, when patient terminated the examination.  IMPRESSION: 1. Patent cystic duct. 2. No bowel activity at 60 min, cannot confirm common bile duct patency. Patient terminated exam before delayed imaging could be acquired.   Electronically Signed   By: Marnee Spring M.D.   On: 05/06/2015 10:06   Ct Abdomen Pelvis W Contrast  05/05/2015   CLINICAL DATA:  64 year old female with mid abdominal pain and emesis.  EXAM: CT ABDOMEN AND PELVIS WITH CONTRAST  TECHNIQUE: Multidetector CT imaging of the abdomen and pelvis was performed using the standard protocol following bolus administration of intravenous contrast.  CONTRAST:  OMNIPAQUE IOHEXOL 300 MG/ML  SOLN  COMPARISON:  CT dated 03/07/2013  FINDINGS: Evaluation of the pelvic structures is limited  due to streak artifact caused by right femoral metallic hardware.  Small left pleural effusion. The visualized lung bases are clear with there is cardiomegaly with dilatation of the left ventricle. Retrograde flow of contrast noted within the IVC indicative of right  cardiac dysfunction. Coronary vascular calcification and CABG clips noted. No intra-abdominal free air. There is a small free fluid within the pelvis.  There is mild irregularity of the hepatic contour concerning for underlying cirrhosis. Clinical correlation is recommended. The gallbladder wall appears thickened and edematous concerning for acute cholecystitis. There is a small pericholecystic fluid. Small calcified stone may be present at the neck of the gallbladder or within the cystic duct. Ultrasound is recommended for better evaluation of the gallbladder. The pancreas splenic and adrenal glands appear unremarkable. There is no hydronephrosis on either side. The visualized ureters and urinary bladder appear unremarkable. The uterus is visualized but not well evaluated.  constipation. There is no evidence of bowel obstruction. There is mild diffuse thickening of the wall of the distal and terminal ileum, likely related to chronic inflammation. There is luminal narrowing of a loop of distal small bowel within the pelvis likely related to chronic inflammation and stricture. Correlation with history of Crohn's disease recommended. The appendix is not visualized with certainty.  There is aortoiliac atherosclerotic disease. The origins of the celiac axis, SMA, IMA as well as the origins of the renal arteries appear patent. There is focal narrowing of the origin of the celiac axis with poststenotic dilatation of the vessel measuring up to 9 mm. There is no lymphadenopathy. There is lumbar scoliosis with degenerative changes of the spine. There is angulation and S1-S2, likely related to an old fracture. There is disc desiccation and vacuum phenomena at  L4-5. Multilevel diffuse disc bulge most prominent at L3-L4 and L5-S1. No acute fracture. Right femoral orthopedic hardware noted. There is diffuse subcutaneous soft tissue stranding and edema. Small fat containing umbilical hernia noted.  IMPRESSION: Inflammatory changes of the gallbladder most compatible with acute cholecystitis. Ultrasound recommended for better evaluation of the gallbladder.  Irregular hepatic contour concerning for cirrhosis. Clinical correlation is recommended.  Segmental thickening of the distal and terminal ileum with areas of strictures, likely related to chronic inflammation. Correlation with history of inflammatory bowel disease such as Crohn's recommended. No bowel obstruction.   Electronically Signed   By: Elgie Collard M.D.   On: 05/05/2015 21:24   Dg Chest Port 1 View  05/05/2015   CLINICAL DATA:  Acute shortness of breath today.  EXAM: PORTABLE CHEST - 1 VIEW  COMPARISON:  03/13/2015 and prior chest radiographs dating back to 11/11/2013  FINDINGS: Cardiomegaly and CABG changes again noted.  Surgical changes overlying the left upper lung again noted.  There is no evidence of focal airspace disease, pulmonary edema, suspicious pulmonary nodule/mass, pleural effusion, or pneumothorax. No acute bony abnormalities are identified.  IMPRESSION: Cardiomegaly without evidence of acute cardiopulmonary disease.   Electronically Signed   By: Harmon Pier M.D.   On: 05/05/2015 15:59   US Abdomen Limited Ruq  05/05/2015   CLINICAL DATA:  Abdominal pain and nausea. Symptoms for 4 days. History of diabetes and hypertension, COPD, CHF, chronic kidney disease, dialysis.  EXAM: US ABDOMEN LIMITED - RIGHT UPPER QUADRANT  COMPARISON:  CT abdomen and pelvis 05/05/2015  FINDINGS: Gallbladder:  Small stones demonstrated in the gallbladder. Stone versus small polyp in the gallbladder neck. No gallbladder distention. Mild borderline gallbladder wall thickening with trace fluid between the liver and  gallbladder. Murphy's sign is negative.  Common bile duct:  Diameter: 4.2 mm, normal  Liver:  Diffusely increased parenchymal echotexture with nodular contour suggesting possible cirrhosis.  IMPRESSION: Small stones in the gallbladder with borderline wall thickening and small amount of  edema. Murphy's sign is negative. Findings are indeterminate but suggestive of cholecystitis. Heterogeneous liver parenchymal echotexture with nodular contour suggest possible cirrhosis.   Electronically Signed   By: Burman Nieves M.D.   On: 05/05/2015 23:17    Microbiology: Recent Results (from the past 240 hour(s))  Gram stain     Status: None   Collection Time: 05/05/15  3:41 PM  Result Value Ref Range Status   Specimen Description URINE, CATHETERIZED  Final   Special Requests Normal  Final   Gram Stain   Final    WBC PRESENT, PREDOMINANTLY MONONUCLEAR NO ORGANISMS SEEN SQUAMOUS EPITHELIAL CELLS PRESENT CYTOSPIN    Report Status 05/05/2015 FINAL  Final  MRSA PCR Screening     Status: None   Collection Time: 05/07/15 12:51 PM  Result Value Ref Range Status   MRSA by PCR NEGATIVE NEGATIVE Final    Comment:        The GeneXpert MRSA Assay (FDA approved for NASAL specimens only), is one component of a comprehensive MRSA colonization surveillance program. It is not intended to diagnose MRSA infection nor to guide or monitor treatment for MRSA infections.      Labs: Basic Metabolic Panel:  Recent Labs Lab 05/05/15 1520 05/06/15 0532 05/08/15 2331  NA 132* 133* 130*  K 5.3* 5.0 4.4  CL 94* 97* 94*  CO2 23 23 25   GLUCOSE 179* 78 164*  BUN 41* 47* 23*  CREATININE 4.57* 5.00* 3.40*  CALCIUM 8.9 8.9 8.5*  PHOS  --   --  3.0   Liver Function Tests:  Recent Labs Lab 05/05/15 1520 05/06/15 0532 05/08/15 2331  AST 36 44*  --   ALT 19 26  --   ALKPHOS 79 72  --   BILITOT 1.1 1.2  --   PROT 7.0 6.7  --   ALBUMIN 3.2* 3.2* 3.1*    Recent Labs Lab 05/05/15 1520  LIPASE 24   No  results for input(s): AMMONIA in the last 168 hours. CBC:  Recent Labs Lab 05/05/15 1520 05/06/15 0532 05/08/15 2332  WBC 7.0 6.6 5.9  NEUTROABS 5.7  --   --   HGB 10.9* 10.4* 10.9*  HCT 35.1* 33.7* 34.7*  MCV 96.7 95.5 95.6  PLT 201 198 143*   Cardiac Enzymes:  Recent Labs Lab 05/05/15 1520 05/05/15 2149  TROPONINI 0.06* 0.07*   BNP: BNP (last 3 results)  Recent Labs  05/05/15 1520  BNP >4500.0*    ProBNP (last 3 results) No results for input(s): PROBNP in the last 8760 hours.  CBG:  Recent Labs Lab 05/07/15 2153 05/08/15 0746 05/08/15 1138 05/08/15 1705 05/09/15 0742  GLUCAP 113* 75 184* 197* 132*       Signed:  Lachell Rochette MD, PhD  [facility] 05/09/2015, 9:16 AM

## 2015-05-12 ENCOUNTER — Emergency Department (HOSPITAL_COMMUNITY): Payer: Medicare Other

## 2015-05-12 ENCOUNTER — Emergency Department (HOSPITAL_COMMUNITY)
Admission: EM | Admit: 2015-05-12 | Discharge: 2015-05-13 | Disposition: A | Discharge status: Home / Self Care | Payer: Medicare Other | Attending: Emergency Medicine | Admitting: Emergency Medicine

## 2015-05-12 ENCOUNTER — Encounter (HOSPITAL_COMMUNITY): Payer: Self-pay | Admitting: Family Medicine

## 2015-05-12 DIAGNOSIS — Z87891 Personal history of nicotine dependence: Secondary | ICD-10-CM | POA: Diagnosis not present

## 2015-05-12 DIAGNOSIS — Z7902 Long term (current) use of antithrombotics/antiplatelets: Secondary | ICD-10-CM | POA: Insufficient documentation

## 2015-05-12 DIAGNOSIS — I509 Heart failure, unspecified: Secondary | ICD-10-CM | POA: Insufficient documentation

## 2015-05-12 DIAGNOSIS — Z7951 Long term (current) use of inhaled steroids: Secondary | ICD-10-CM | POA: Diagnosis not present

## 2015-05-12 DIAGNOSIS — N189 Chronic kidney disease, unspecified: Secondary | ICD-10-CM | POA: Diagnosis not present

## 2015-05-12 DIAGNOSIS — R112 Nausea with vomiting, unspecified: Secondary | ICD-10-CM | POA: Diagnosis present

## 2015-05-12 DIAGNOSIS — J449 Chronic obstructive pulmonary disease, unspecified: Secondary | ICD-10-CM | POA: Insufficient documentation

## 2015-05-12 DIAGNOSIS — Z951 Presence of aortocoronary bypass graft: Secondary | ICD-10-CM | POA: Insufficient documentation

## 2015-05-12 DIAGNOSIS — Z862 Personal history of diseases of the blood and blood-forming organs and certain disorders involving the immune mechanism: Secondary | ICD-10-CM | POA: Insufficient documentation

## 2015-05-12 DIAGNOSIS — Z7982 Long term (current) use of aspirin: Secondary | ICD-10-CM | POA: Insufficient documentation

## 2015-05-12 DIAGNOSIS — Z79899 Other long term (current) drug therapy: Secondary | ICD-10-CM | POA: Insufficient documentation

## 2015-05-12 DIAGNOSIS — K8020 Calculus of gallbladder without cholecystitis without obstruction: Secondary | ICD-10-CM | POA: Insufficient documentation

## 2015-05-12 DIAGNOSIS — E119 Type 2 diabetes mellitus without complications: Secondary | ICD-10-CM | POA: Diagnosis not present

## 2015-05-12 DIAGNOSIS — K8050 Calculus of bile duct without cholangitis or cholecystitis without obstruction: Secondary | ICD-10-CM

## 2015-05-12 DIAGNOSIS — I129 Hypertensive chronic kidney disease with stage 1 through stage 4 chronic kidney disease, or unspecified chronic kidney disease: Secondary | ICD-10-CM | POA: Diagnosis not present

## 2015-05-12 LAB — URINALYSIS, ROUTINE W REFLEX MICROSCOPIC
Glucose, UA: 100 mg/dL — AB
Ketones, ur: 15 mg/dL — AB
NITRITE: NEGATIVE
Specific Gravity, Urine: 1.025 (ref 1.005–1.030)
Urobilinogen, UA: 1 mg/dL (ref 0.0–1.0)
pH: 5.5 (ref 5.0–8.0)

## 2015-05-12 LAB — CBC
HCT: 38.3 % (ref 36.0–46.0)
HEMOGLOBIN: 11.9 g/dL — AB (ref 12.0–15.0)
MCH: 30.7 pg (ref 26.0–34.0)
MCHC: 31.1 g/dL (ref 30.0–36.0)
MCV: 98.7 fL (ref 78.0–100.0)
Platelets: 176 10*3/uL (ref 150–400)
RBC: 3.88 MIL/uL (ref 3.87–5.11)
RDW: 19.6 % — ABNORMAL HIGH (ref 11.5–15.5)
WBC: 7.3 10*3/uL (ref 4.0–10.5)

## 2015-05-12 LAB — URINE MICROSCOPIC-ADD ON

## 2015-05-12 LAB — CBG MONITORING, ED: Glucose-Capillary: 89 mg/dL (ref 65–99)

## 2015-05-12 LAB — COMPREHENSIVE METABOLIC PANEL
ALK PHOS: 86 U/L (ref 38–126)
ALT: 25 U/L (ref 14–54)
ANION GAP: 13 (ref 5–15)
AST: 32 U/L (ref 15–41)
Albumin: 3.6 g/dL (ref 3.5–5.0)
BUN: 33 mg/dL — ABNORMAL HIGH (ref 6–20)
CALCIUM: 10.2 mg/dL (ref 8.9–10.3)
CO2: 26 mmol/L (ref 22–32)
Chloride: 97 mmol/L — ABNORMAL LOW (ref 101–111)
Creatinine, Ser: 3.96 mg/dL — ABNORMAL HIGH (ref 0.44–1.00)
GFR calc non Af Amer: 11 mL/min — ABNORMAL LOW (ref >60)
GFR, EST AFRICAN AMERICAN: 13 mL/min — AB (ref >60)
Glucose, Bld: 160 mg/dL — ABNORMAL HIGH (ref 65–99)
Potassium: 4.9 mmol/L (ref 3.5–5.1)
SODIUM: 136 mmol/L (ref 135–145)
TOTAL PROTEIN: 7.8 g/dL (ref 6.5–8.1)
Total Bilirubin: 1 mg/dL (ref 0.3–1.2)

## 2015-05-12 LAB — LIPASE, BLOOD: LIPASE: 26 U/L (ref 22–51)

## 2015-05-12 MED ORDER — LORAZEPAM 1 MG PO TABS
0.5000 mg | ORAL_TABLET | Freq: Once | ORAL | Status: AC
  Administered 2015-05-12: 0.5 mg via ORAL
  Filled 2015-05-12: qty 1

## 2015-05-12 MED ORDER — ONDANSETRON HCL 4 MG/2ML IJ SOLN
4.0000 mg | Freq: Once | INTRAMUSCULAR | Status: AC
Start: 2015-05-12 — End: 2015-05-12
  Administered 2015-05-12: 4 mg via INTRAVENOUS
  Filled 2015-05-12: qty 2

## 2015-05-12 MED ORDER — SODIUM CHLORIDE 0.9 % IV BOLUS (SEPSIS): 500.0000 mL | Freq: Once | INTRAVENOUS | Status: DC

## 2015-05-12 NOTE — ED Notes (Signed)
CBG 89 

## 2015-05-12 NOTE — Discharge Instructions (Signed)
Return here as needed. Follow up with the surgeons

## 2015-05-12 NOTE — ED Provider Notes (Signed)
Medical screening examination/treatment/procedure(s) were conducted as a shared visit with non-physician practitioner(s) and myself.  I personally evaluated the patient during the encounter.   EKG Interpretation None      Results for orders placed or performed during the hospital encounter of 05/12/15  Lipase, blood  Result Value Ref Range   Lipase 26 22 - 51 U/L  Comprehensive metabolic panel  Result Value Ref Range   Sodium 136 135 - 145 mmol/L   Potassium 4.9 3.5 - 5.1 mmol/L   Chloride 97 (L) 101 - 111 mmol/L   CO2 26 22 - 32 mmol/L   Glucose, Bld 160 (H) 65 - 99 mg/dL   BUN 33 (H) 6 - 20 mg/dL   Creatinine, Ser 9.47 (H) 0.44 - 1.00 mg/dL   Calcium 65.4 8.9 - 65.0 mg/dL   Total Protein 7.8 6.5 - 8.1 g/dL   Albumin 3.6 3.5 - 5.0 g/dL   AST 32 15 - 41 U/L   ALT 25 14 - 54 U/L   Alkaline Phosphatase 86 38 - 126 U/L   Total Bilirubin 1.0 0.3 - 1.2 mg/dL   GFR calc non Af Amer 11 (L) >60 mL/min   GFR calc Af Amer 13 (L) >60 mL/min   Anion gap 13 5 - 15  CBC  Result Value Ref Range   WBC 7.3 4.0 - 10.5 K/uL   RBC 3.88 3.87 - 5.11 MIL/uL   Hemoglobin 11.9 (L) 12.0 - 15.0 g/dL   HCT 35.4 65.6 - 81.2 %   MCV 98.7 78.0 - 100.0 fL   MCH 30.7 26.0 - 34.0 pg   MCHC 31.1 30.0 - 36.0 g/dL   RDW 75.1 (H) 70.0 - 17.4 %   Platelets 176 150 - 400 K/uL  Urinalysis, Routine w reflex microscopic (not at [facility])  Result Value Ref Range   Color, Urine AMBER (A) YELLOW   APPearance CLOUDY (A) CLEAR   Specific Gravity, Urine 1.025 1.005 - 1.030   pH 5.5 5.0 - 8.0   Glucose, UA 100 (A) NEGATIVE mg/dL   Hgb urine dipstick MODERATE (A) NEGATIVE   Bilirubin Urine SMALL (A) NEGATIVE   Ketones, ur 15 (A) NEGATIVE mg/dL   Protein, ur >944 (A) NEGATIVE mg/dL   Urobilinogen, UA 1.0 0.0 - 1.0 mg/dL   Nitrite NEGATIVE NEGATIVE   Leukocytes, UA TRACE (A) NEGATIVE  Urine microscopic-add on  Result Value Ref Range   Squamous Epithelial / LPF MANY (A) RARE   WBC, UA 3-6 <3 WBC/hpf   RBC / HPF 0-2  <3 RBC/hpf   Bacteria, UA RARE RARE  POC CBG, ED  Result Value Ref Range   Glucose-Capillary 89 65 - 99 mg/dL   Comment 1 Notify RN    Nm Hepatobiliary Liver Func  05/06/2015   CLINICAL DATA:  Right upper quadrant pain with nausea and vomiting. Gallstones. Evaluate for acute cholecystitis.  EXAM: NUCLEAR MEDICINE HEPATOBILIARY IMAGING  TECHNIQUE: Sequential images of the abdomen were obtained out to 60 minutes following intravenous administration of radiopharmaceutical.  RADIOPHARMACEUTICALS:  5.1 mCi Tc-42m  Choletec IV  COMPARISON:  None.  FINDINGS: Prompt and homogeneous uptake into the liver. Prominent blood pool in this patient with suspected cirrhosis.  Central bile ducts seen by 25 min and the gallbladder by 30 min. No tracer in the bowel at 60 min, when patient terminated the examination.  IMPRESSION: 1. Patent cystic duct. 2. No bowel activity at 60 min, cannot confirm common bile duct patency. Patient terminated exam before delayed  imaging could be acquired.   Electronically Signed   By: Marnee Spring M.D.   On: 05/06/2015 10:06   US Abdomen Complete  05/12/2015   CLINICAL DATA:  RIGHT upper quadrant pain. History of chronic kidney disease, diabetes, hypertension.  EXAM: ULTRASOUND ABDOMEN COMPLETE  COMPARISON:  Hepatobiliary scintigraphy May 06, 2015 and abdominal ultrasound May 05, 2015  FINDINGS: Gallbladder: Tiny echogenic layering gallstones measure up to 5 mm. No gallbladder wall thickening or pericholecystic fluid. No sonographic Murphy's sign elicited.  Common bile duct: Diameter: 4 mm  Liver: Diffusely mildly echogenic without intrahepatic biliary dilatation. Mildly nodular contour. Hepatopetal portal vein.  IVC: No abnormality visualized.  Pancreas: Visualized portion unremarkable.  Spleen: Size and appearance within normal limits.  Right Kidney: Length: 10.3 cm. Echogenicity within normal limits. No mass or hydronephrosis visualized.  Left Kidney: Length: 9.7 cm.  Echogenicity within normal limits. No mass or hydronephrosis visualized.  Abdominal aorta: No aneurysm visualized.  Other findings: None.  IMPRESSION: Cholelithiasis without sonographic findings of acute cholecystitis.  Echogenic liver with slightly nodular contour concerning for early cirrhosis.   Electronically Signed   By: Awilda Metro M.D.   On: 05/12/2015 22:19   Ct Abdomen Pelvis W Contrast  05/05/2015   CLINICAL DATA:  64 year old female with mid abdominal pain and emesis.  EXAM: CT ABDOMEN AND PELVIS WITH CONTRAST  TECHNIQUE: Multidetector CT imaging of the abdomen and pelvis was performed using the standard protocol following bolus administration of intravenous contrast.  CONTRAST:  OMNIPAQUE IOHEXOL 300 MG/ML  SOLN  COMPARISON:  CT dated 03/07/2013  FINDINGS: Evaluation of the pelvic structures is limited due to streak artifact caused by right femoral metallic hardware.  Small left pleural effusion. The visualized lung bases are clear with there is cardiomegaly with dilatation of the left ventricle. Retrograde flow of contrast noted within the IVC indicative of right cardiac dysfunction. Coronary vascular calcification and CABG clips noted. No intra-abdominal free air. There is a small free fluid within the pelvis.  There is mild irregularity of the hepatic contour concerning for underlying cirrhosis. Clinical correlation is recommended. The gallbladder wall appears thickened and edematous concerning for acute cholecystitis. There is a small pericholecystic fluid. Small calcified stone may be present at the neck of the gallbladder or within the cystic duct. Ultrasound is recommended for better evaluation of the gallbladder. The pancreas splenic and adrenal glands appear unremarkable. There is no hydronephrosis on either side. The visualized ureters and urinary bladder appear unremarkable. The uterus is visualized but not well evaluated.  constipation. There is no evidence of bowel obstruction.  There is mild diffuse thickening of the wall of the distal and terminal ileum, likely related to chronic inflammation. There is luminal narrowing of a loop of distal small bowel within the pelvis likely related to chronic inflammation and stricture. Correlation with history of Crohn's disease recommended. The appendix is not visualized with certainty.  There is aortoiliac atherosclerotic disease. The origins of the celiac axis, SMA, IMA as well as the origins of the renal arteries appear patent. There is focal narrowing of the origin of the celiac axis with poststenotic dilatation of the vessel measuring up to 9 mm. There is no lymphadenopathy. There is lumbar scoliosis with degenerative changes of the spine. There is angulation and S1-S2, likely related to an old fracture. There is disc desiccation and vacuum phenomena at L4-5. Multilevel diffuse disc bulge most prominent at L3-L4 and L5-S1. No acute fracture. Right femoral orthopedic hardware noted. There is  diffuse subcutaneous soft tissue stranding and edema. Small fat containing umbilical hernia noted.  IMPRESSION: Inflammatory changes of the gallbladder most compatible with acute cholecystitis. Ultrasound recommended for better evaluation of the gallbladder.  Irregular hepatic contour concerning for cirrhosis. Clinical correlation is recommended.  Segmental thickening of the distal and terminal ileum with areas of strictures, likely related to chronic inflammation. Correlation with history of inflammatory bowel disease such as Crohn's recommended. No bowel obstruction.   Electronically Signed   By: Elgie Collard M.D.   On: 05/05/2015 21:24   Dg Chest Port 1 View  05/05/2015   CLINICAL DATA:  Acute shortness of breath today.  EXAM: PORTABLE CHEST - 1 VIEW  COMPARISON:  03/13/2015 and prior chest radiographs dating back to 11/11/2013  FINDINGS: Cardiomegaly and CABG changes again noted.  Surgical changes overlying the left upper lung again noted.  There is  no evidence of focal airspace disease, pulmonary edema, suspicious pulmonary nodule/mass, pleural effusion, or pneumothorax. No acute bony abnormalities are identified.  IMPRESSION: Cardiomegaly without evidence of acute cardiopulmonary disease.   Electronically Signed   By: Harmon Pier M.D.   On: 05/05/2015 15:59   US Abdomen Limited Ruq  05/05/2015   CLINICAL DATA:  Abdominal pain and nausea. Symptoms for 4 days. History of diabetes and hypertension, COPD, CHF, chronic kidney disease, dialysis.  EXAM: US ABDOMEN LIMITED - RIGHT UPPER QUADRANT  COMPARISON:  CT abdomen and pelvis 05/05/2015  FINDINGS: Gallbladder:  Small stones demonstrated in the gallbladder. Stone versus small polyp in the gallbladder neck. No gallbladder distention. Mild borderline gallbladder wall thickening with trace fluid between the liver and gallbladder. Murphy's sign is negative.  Common bile duct:  Diameter: 4.2 mm, normal  Liver:  Diffusely increased parenchymal echotexture with nodular contour suggesting possible cirrhosis.  IMPRESSION: Small stones in the gallbladder with borderline wall thickening and small amount of edema. Murphy's sign is negative. Findings are indeterminate but suggestive of cholecystitis. Heterogeneous liver parenchymal echotexture with nodular contour suggest possible cirrhosis.   Electronically Signed   By: Burman Nieves M.D.   On: 05/05/2015 23:17   Patient is being followed by general surgery. Patient known to have stones in the gallbladder. No evidence of any acute cholecystitis today. Labs without any significant abnormality. Possible the patient's symptoms overnight with some right upper quadrant abdominal pain and with one episode of vomiting could've been related to the gallbladder. No indications for admission at this time. Patient given precautions. Patient's abdomen is soft and nontender.   Vanetta Mulders, MD 05/12/15 2326

## 2015-05-12 NOTE — ED Notes (Signed)
Pt to ultrasound at this time.

## 2015-05-12 NOTE — ED Notes (Signed)
Pt requesting Lorazepam for her nerves.  Ebbie Ridge PA made aware.

## 2015-05-12 NOTE — ED Notes (Signed)
Pt recently discharged from hosp.with dx of gallbladder disease.  Pt st;s she started having upper abd pain with nausea and vomiting this am.  Also c/o head spinning

## 2015-05-12 NOTE — ED Notes (Signed)
Pt here with N,V and abd pain since last night. sts was just here 3 days ago for UTI.

## 2015-05-12 NOTE — ED Provider Notes (Signed)
CSN: [id number]     Arrival date & time 05/12/15  1321 History   First MD Initiated Contact with Patient 05/12/15 1826     Chief Complaint  Patient presents with  . Nausea  . Emesis     (Consider location/radiation/quality/duration/timing/severity/associated sxs/prior Treatment) HPI  Kathleen [name] is a 64 yo female with an extensive PMH who presents today with an episode of "room spinning and nausea" this morning, that has since resolved. This episode occurred early this morning while the patient was lying down, she threw up twice. Patient states she felt like she could not get out of bed but eventually did and was able to eat some breakfast. She reports no nausea since this episode. Patient was last seen here on 9/5 and diagnosed with cholecystitis. She was scheduled for surgery on 9/7 but it was cancelled because of her health. She admits to epigastric abdominal pain. Patient states she feels like it is swollen and bloated. She has taken nothing for the pain. She endorses night sweats. Patient states she feel SOB most of the time. Patient denies recent fever, chills, diarrhea, lightheadedness.   Past Medical History  Diagnosis Date  . Chronic kidney disease   . Diabetes mellitus without complication   . Hypertension   . COPD (chronic obstructive pulmonary disease)   . Ischemic cardiomyopathy   . Anemia   . Family history of anesthesia complication     MOTHER HAD NAUSEA  . CHF (congestive heart failure)     CHRONIC SYSTOLIC  . Shortness of breath    Past Surgical History  Procedure Laterality Date  . Knee cartilage surgery Left 2007  . Video assisted thoracoscopy (vats)/ lymph node sampling Left 2007  . Incontinence surgery  ?2006  . Coronary artery bypass graft  2010    x 1   History reviewed. No pertinent family history. Social History  Substance Use Topics  . Smoking status: Former Smoker -- 80 years    Quit date: 08/30/2008  . Smokeless tobacco: Never Used  . Alcohol Use:  No   OB History    No data available     Review of Systems  All other systems negative except as documented in the HPI. All pertinent positives and negatives as reviewed in the HPI.  Allergies  Statins; Levaquin; Metformin and related; Other; Sulfa antibiotics; and Tobradex  Home Medications   Prior to Admission medications   Medication Sig Start Date End Date Taking? Authorizing Provider  ARIPiprazole (ABILIFY) 5 MG tablet Take 5 mg by mouth daily.    Historical Provider, MD  aspirin 81 MG tablet Take 81 mg by mouth daily.    Historical Provider, MD  b complex-vitamin c-folic acid (NEPHRO-VITE) 0.8 MG TABS tablet Take 1 tablet by mouth Every Tuesday,Thursday,and Saturday with dialysis.     Historical Provider, MD  budesonide-formoterol (SYMBICORT) 160-4.5 MCG/ACT inhaler Inhale 2 puffs into the lungs 2 (two) times daily.    Historical Provider, MD  calcitRIOL (ROCALTROL) 0.5 MCG capsule Take 2 capsules (1 mcg total) by mouth Every Tuesday,Thursday,and Saturday with dialysis. 03/07/15   Catarina Hartshorn, MD  calcium acetate (PHOSLO) 667 MG tablet Take 1 tablet by mouth 3 (three) times daily with meals. 03/07/15   Catarina Hartshorn, MD  citalopram (CELEXA) 40 MG tablet Take 40 mg by mouth at bedtime.  04/29/15   Historical Provider, MD  clopidogrel (PLAVIX) 75 MG tablet Take 75 mg by mouth daily. 04/21/15   Historical Provider, MD  docusate sodium (COLACE) 100  MG capsule Take 100 mg by mouth 2 (two) times daily.     Historical Provider, MD  glycerin adult (GLYCERIN ADULT) 2 G SUPP Place 1 suppository rectally daily as needed for moderate constipation.    Historical Provider, MD  ipratropium-albuterol (DUONEB) 0.5-2.5 (3) MG/3ML SOLN Take 3 mLs by nebulization 2 (two) times daily as needed (shortness of breath).     Historical Provider, MD  LORazepam (ATIVAN) 0.5 MG tablet Take 1 tablet (0.5 mg total) by mouth 2 (two) times daily as needed for anxiety. Patient taking differently: Take 0.5 mg by mouth 3 (three)  times daily as needed for anxiety.  03/07/15   Catarina Hartshorn, MD  nitroGLYCERIN (NITROSTAT) 0.4 MG SL tablet Place 0.4 mg under the tongue every 5 (five) minutes as needed for chest pain.    Historical Provider, MD  omeprazole (PRILOSEC) 40 MG capsule Take 40 mg by mouth daily.    Historical Provider, MD  ondansetron (ZOFRAN) 4 MG tablet Take 4 mg by mouth. 04/28/15   Historical Provider, MD  oxyCODONE-acetaminophen (PERCOCET/ROXICET) 5-325 MG per tablet Take 1 tablet by mouth every 4 (four) hours as needed for moderate pain. pain 02/24/15   Historical Provider, MD  Senna-Fennel (NATURAL VEGETABLE LAXATIVE PO) Take 1 tablet by mouth 2 (two) times daily.    Historical Provider, MD   BP 137/62 mmHg  Pulse 94  Temp(Src) 98.1 F (36.7 C) (Oral)  Resp 18  SpO2 95% Physical Exam  Constitutional: She is oriented to person, place, and time. She appears well-developed and well-nourished. No distress.  HENT:  Head: Normocephalic.  Mouth/Throat: Oropharynx is clear and moist.  Eyes: Pupils are equal, round, and reactive to light.  Neck: Normal range of motion. Neck supple.  Cardiovascular: Normal rate, regular rhythm and normal heart sounds.  Exam reveals no gallop and no friction rub.   No murmur heard. Pulmonary/Chest: Effort normal and breath sounds normal. No respiratory distress.  Abdominal: Soft. Bowel sounds are normal. She exhibits no distension. There is tenderness. There is no rebound and no guarding.  Musculoskeletal: She exhibits no edema.  Neurological: She is alert and oriented to person, place, and time. She exhibits normal muscle tone. Coordination normal.  Skin: Skin is warm and dry. No rash noted. No erythema.  Psychiatric: She has a normal mood and affect. Her behavior is normal.  Nursing note and vitals reviewed.   ED Course  Procedures (including critical care time) Labs Review Labs Reviewed  COMPREHENSIVE METABOLIC PANEL - Abnormal; Notable for the following:    Chloride 97 (*)     Glucose, Bld 160 (*)    BUN 33 (*)    Creatinine, Ser 3.96 (*)    GFR calc non Af Amer 11 (*)    GFR calc Af Amer 13 (*)    All other components within normal limits  CBC - Abnormal; Notable for the following:    Hemoglobin 11.9 (*)    RDW 19.6 (*)    All other components within normal limits  URINALYSIS, ROUTINE W REFLEX MICROSCOPIC (NOT AT [facility]) - Abnormal; Notable for the following:    Color, Urine AMBER (*)    APPearance CLOUDY (*)    Glucose, UA 100 (*)    Hgb urine dipstick MODERATE (*)    Bilirubin Urine SMALL (*)    Ketones, ur 15 (*)    Protein, ur >300 (*)    Leukocytes, UA TRACE (*)    All other components within normal limits  URINE MICROSCOPIC-ADD  ON - Abnormal; Notable for the following:    Squamous Epithelial / LPF MANY (*)    All other components within normal limits  LIPASE, BLOOD  CBG MONITORING, ED    Imaging Review No results found. I have personally reviewed and evaluated these images and lab results as part of my medical decision-making.   EKG Interpretation None      Patient does not have any signs of cholecystitis at this time.  I advised follow-up with the surgeon.  Told to return here as needed.  Patient agrees the plan and all questions were answered.  Patient is otherwise stable she has been eating and drinking department  Charlestine Night, PA-C 05/15/15 2234

## 2015-05-17 ENCOUNTER — Encounter (HOSPITAL_COMMUNITY): Payer: Self-pay

## 2015-05-17 ENCOUNTER — Emergency Department (HOSPITAL_COMMUNITY)
Admission: EM | Admit: 2015-05-17 | Discharge: 2015-05-17 | Disposition: A | Discharge status: Home / Self Care | Payer: Medicare Other | Attending: Emergency Medicine | Admitting: Emergency Medicine

## 2015-05-17 ENCOUNTER — Emergency Department (HOSPITAL_COMMUNITY): Payer: Medicare Other

## 2015-05-17 DIAGNOSIS — J441 Chronic obstructive pulmonary disease with (acute) exacerbation: Secondary | ICD-10-CM | POA: Insufficient documentation

## 2015-05-17 DIAGNOSIS — Z7902 Long term (current) use of antithrombotics/antiplatelets: Secondary | ICD-10-CM | POA: Diagnosis not present

## 2015-05-17 DIAGNOSIS — Z862 Personal history of diseases of the blood and blood-forming organs and certain disorders involving the immune mechanism: Secondary | ICD-10-CM | POA: Diagnosis not present

## 2015-05-17 DIAGNOSIS — R0602 Shortness of breath: Secondary | ICD-10-CM | POA: Diagnosis present

## 2015-05-17 DIAGNOSIS — Z87891 Personal history of nicotine dependence: Secondary | ICD-10-CM | POA: Insufficient documentation

## 2015-05-17 DIAGNOSIS — Z7982 Long term (current) use of aspirin: Secondary | ICD-10-CM | POA: Diagnosis not present

## 2015-05-17 DIAGNOSIS — I509 Heart failure, unspecified: Secondary | ICD-10-CM | POA: Insufficient documentation

## 2015-05-17 DIAGNOSIS — Z79899 Other long term (current) drug therapy: Secondary | ICD-10-CM | POA: Insufficient documentation

## 2015-05-17 DIAGNOSIS — N189 Chronic kidney disease, unspecified: Secondary | ICD-10-CM | POA: Diagnosis not present

## 2015-05-17 DIAGNOSIS — I129 Hypertensive chronic kidney disease with stage 1 through stage 4 chronic kidney disease, or unspecified chronic kidney disease: Secondary | ICD-10-CM | POA: Diagnosis not present

## 2015-05-17 DIAGNOSIS — E119 Type 2 diabetes mellitus without complications: Secondary | ICD-10-CM | POA: Diagnosis not present

## 2015-05-17 LAB — COMPREHENSIVE METABOLIC PANEL
ALBUMIN: 3.6 g/dL (ref 3.5–5.0)
ALT: 16 U/L (ref 14–54)
ANION GAP: 13 (ref 5–15)
AST: 28 U/L (ref 15–41)
Alkaline Phosphatase: 92 U/L (ref 38–126)
BILIRUBIN TOTAL: 1.1 mg/dL (ref 0.3–1.2)
BUN: 30 mg/dL — ABNORMAL HIGH (ref 6–20)
CALCIUM: 9.1 mg/dL (ref 8.9–10.3)
CO2: 27 mmol/L (ref 22–32)
Chloride: 92 mmol/L — ABNORMAL LOW (ref 101–111)
Creatinine, Ser: 3.25 mg/dL — ABNORMAL HIGH (ref 0.44–1.00)
GFR, EST AFRICAN AMERICAN: 16 mL/min — AB (ref >60)
GFR, EST NON AFRICAN AMERICAN: 14 mL/min — AB (ref >60)
Glucose, Bld: 123 mg/dL — ABNORMAL HIGH (ref 65–99)
POTASSIUM: 3.7 mmol/L (ref 3.5–5.1)
Sodium: 132 mmol/L — ABNORMAL LOW (ref 135–145)
TOTAL PROTEIN: 8.3 g/dL — AB (ref 6.5–8.1)

## 2015-05-17 LAB — CBC WITH DIFFERENTIAL/PLATELET
BASOS PCT: 1 %
Basophils Absolute: 0 10*3/uL (ref 0.0–0.1)
Eosinophils Absolute: 0.3 10*3/uL (ref 0.0–0.7)
Eosinophils Relative: 3 %
HEMATOCRIT: 38.7 % (ref 36.0–46.0)
Hemoglobin: 12 g/dL (ref 12.0–15.0)
LYMPHS ABS: 1.1 10*3/uL (ref 0.7–4.0)
Lymphocytes Relative: 14 %
MCH: 30.2 pg (ref 26.0–34.0)
MCHC: 31 g/dL (ref 30.0–36.0)
MCV: 97.2 fL (ref 78.0–100.0)
MONO ABS: 0.9 10*3/uL (ref 0.1–1.0)
MONOS PCT: 12 %
NEUTROS ABS: 5.6 10*3/uL (ref 1.7–7.7)
Neutrophils Relative %: 70 %
Platelets: 175 10*3/uL (ref 150–400)
RBC: 3.98 MIL/uL (ref 3.87–5.11)
RDW: 18.9 % — AB (ref 11.5–15.5)
WBC: 8 10*3/uL (ref 4.0–10.5)

## 2015-05-17 LAB — TROPONIN I: TROPONIN I: 0.05 ng/mL — AB (ref <0.031)

## 2015-05-17 MED ORDER — PREDNISONE 20 MG PO TABS: 40.0000 mg | ORAL_TABLET | Freq: Every day | ORAL | Status: DC

## 2015-05-17 MED ORDER — METHYLPREDNISOLONE SODIUM SUCC 125 MG IJ SOLR
125.0000 mg | Freq: Once | INTRAMUSCULAR | Status: AC
  Administered 2015-05-17: 125 mg via INTRAVENOUS
  Filled 2015-05-17: qty 2

## 2015-05-17 MED ORDER — LORAZEPAM 1 MG PO TABS
0.5000 mg | ORAL_TABLET | Freq: Once | ORAL | Status: AC
  Administered 2015-05-17: 0.5 mg via ORAL
  Filled 2015-05-17: qty 1

## 2015-05-17 MED ORDER — ALBUTEROL SULFATE (2.5 MG/3ML) 0.083% IN NEBU
5.0000 mg | INHALATION_SOLUTION | Freq: Once | RESPIRATORY_TRACT | Status: AC
  Administered 2015-05-17: 5 mg via RESPIRATORY_TRACT
  Filled 2015-05-17: qty 6

## 2015-05-17 NOTE — ED Provider Notes (Signed)
CSN: [id number]     Arrival date & time 05/17/15  1526 History   First MD Initiated Contact with Patient 05/17/15 1535     Chief Complaint  Patient presents with  . Shortness of Breath     (Consider location/radiation/quality/duration/timing/severity/associated sxs/prior Treatment) HPI Comments: The patient is a 64 year old female, she has known COPD as well as kidney failure on dialysis for the last 2 years. She has had a couple of visits to the ER this month, initially for what was thought to be cholecystitis, her study showed that she had minimal cholecystitis findings and it was decided by general surgery and the admitting team that she would do better without surgery and to treat this conservatively as she was on a blood thinner and needed to be secondary to her underlying disease. She has done very well, she return to the emergency department several days ago because of nausea and vomiting had also improved without difficulty, she returns today after being short of breath this morning. She felt as though she was improved after a treatment of a metered-dose inhaler of albuterol at the beginning of dialysis, she continued with dialysis and required an albuterol nebulizer treatment however because of ongoing shortness of breath she was transported to the hospital by paramedics for evaluation. She was transported with her dialysis access in her left upper extremity. She denies chest pain, fevers but has had some coughing which she states is chronic. She also reports bilateral lower extremity edema which is mild and chronic. There is no abdominal pain, no nausea or vomiting and no difficulty with pain after eating.  Patient is a 64 y.o. female presenting with shortness of breath. The history is provided by the patient, the EMS personnel and medical records.  Shortness of Breath   Past Medical History  Diagnosis Date  . Chronic kidney disease   . Diabetes mellitus without complication   .  Hypertension   . COPD (chronic obstructive pulmonary disease)   . Ischemic cardiomyopathy   . Anemia   . Family history of anesthesia complication     MOTHER HAD NAUSEA  . CHF (congestive heart failure)     CHRONIC SYSTOLIC  . Shortness of breath    Past Surgical History  Procedure Laterality Date  . Knee cartilage surgery Left 2007  . Video assisted thoracoscopy (vats)/ lymph node sampling Left 2007  . Incontinence surgery  ?2006  . Coronary artery bypass graft  2010    x 1   No family history on file. Social History  Substance Use Topics  . Smoking status: Former Smoker -- 80 years    Quit date: 08/30/2008  . Smokeless tobacco: Never Used  . Alcohol Use: No   OB History    No data available     Review of Systems  Respiratory: Positive for shortness of breath.   All other systems reviewed and are negative.     Allergies  Other; Metformin and related; Statins; Levaquin; Sulfa antibiotics; and Tobradex  Home Medications   Prior to Admission medications   Medication Sig Start Date End Date Taking? Authorizing Provider  ARIPiprazole (ABILIFY) 5 MG tablet Take 5 mg by mouth daily.    Historical Provider, MD  aspirin 81 MG tablet Take 81 mg by mouth daily.    Historical Provider, MD  b complex-vitamin c-folic acid (NEPHRO-VITE) 0.8 MG TABS tablet Take 1 tablet by mouth Every Tuesday,Thursday,and Saturday with dialysis.     Historical Provider, MD  budesonide-formoterol [facility]) 160-4.5  MCG/ACT inhaler Inhale 2 puffs into the lungs 2 (two) times daily.    Historical Provider, MD  calcitRIOL (ROCALTROL) 0.5 MCG capsule Take 2 capsules (1 mcg total) by mouth Every Tuesday,Thursday,and Saturday with dialysis. 03/07/15   Catarina Hartshorn, MD  calcium acetate (PHOSLO) 667 MG tablet Take 1 tablet by mouth 3 (three) times daily with meals. 03/07/15   Catarina Hartshorn, MD  citalopram (CELEXA) 40 MG tablet Take 40 mg by mouth at bedtime.  04/29/15   Historical Provider, MD  clopidogrel (PLAVIX)  75 MG tablet Take 75 mg by mouth daily. 04/21/15   Historical Provider, MD  docusate sodium (COLACE) 100 MG capsule Take 100 mg by mouth 2 (two) times daily.     Historical Provider, MD  glycerin adult (GLYCERIN ADULT) 2 G SUPP Place 1 suppository rectally daily as needed for moderate constipation.    Historical Provider, MD  LORazepam (ATIVAN) 0.5 MG tablet Take 1 tablet (0.5 mg total) by mouth 2 (two) times daily as needed for anxiety. Patient taking differently: Take 0.5 mg by mouth 3 (three) times daily as needed for anxiety.  03/07/15   Catarina Hartshorn, MD  nitroGLYCERIN (NITROSTAT) 0.4 MG SL tablet Place 0.4 mg under the tongue every 5 (five) minutes as needed for chest pain.    Historical Provider, MD  omeprazole (PRILOSEC) 40 MG capsule Take 40 mg by mouth daily.    Historical Provider, MD  ondansetron (ZOFRAN) 4 MG tablet Take 4 mg by mouth every 8 (eight) hours as needed for nausea or vomiting.  04/28/15   Historical Provider, MD  oxyCODONE-acetaminophen (PERCOCET/ROXICET) 5-325 MG per tablet Take 1 tablet by mouth every 4 (four) hours as needed for moderate pain. pain 02/24/15   Historical Provider, MD  predniSONE (DELTASONE) 20 MG tablet Take 2 tablets (40 mg total) by mouth daily. 05/17/15   Eber Hong, MD  Senna-Fennel (NATURAL VEGETABLE LAXATIVE PO) Take 1 tablet by mouth 2 (two) times daily.    Historical Provider, MD   BP 119/55 mmHg  Pulse 88  Temp(Src) 97.7 F (36.5 C) (Oral)  Resp 11  SpO2 100% Physical Exam  Constitutional: She appears well-developed and well-nourished. No distress.  HENT:  Head: Normocephalic and atraumatic.  Mouth/Throat: Oropharynx is clear and moist. No oropharyngeal exudate.  Eyes: Conjunctivae and EOM are normal. Pupils are equal, round, and reactive to light. Right eye exhibits no discharge. Left eye exhibits no discharge. No scleral icterus.  Neck: Normal range of motion. Neck supple. No JVD present. No thyromegaly present.  Cardiovascular: Normal rate,  regular rhythm and intact distal pulses.  Exam reveals no gallop and no friction rub.   Murmur (soft systolic murmur) heard. No JVD  Pulmonary/Chest: Effort normal. No respiratory distress. She has wheezes. She has rales.  Mild tachypnea, rales at the bases, expiratory wheezing diffusely, does not appear to be in distress  Abdominal: Soft. Bowel sounds are normal. She exhibits no distension and no mass. There is no tenderness.  Musculoskeletal: Normal range of motion. She exhibits no edema or tenderness.  Essentially no peripheral pitting edema  Lymphadenopathy:    She has no cervical adenopathy.  Neurological: She is alert. Coordination normal.  Skin: Skin is warm and dry. No rash noted. No erythema.  Psychiatric: She has a normal mood and affect. Her behavior is normal.  Nursing note and vitals reviewed.   ED Course  Procedures (including critical care time) Labs Review Labs Reviewed  CBC WITH DIFFERENTIAL/PLATELET - Abnormal; Notable for the following:  RDW 18.9 (*)    All other components within normal limits  COMPREHENSIVE METABOLIC PANEL - Abnormal; Notable for the following:    Sodium 132 (*)    Chloride 92 (*)    Glucose, Bld 123 (*)    BUN 30 (*)    Creatinine, Ser 3.25 (*)    Total Protein 8.3 (*)    GFR calc non Af Amer 14 (*)    GFR calc Af Amer 16 (*)    All other components within normal limits  TROPONIN I - Abnormal; Notable for the following:    Troponin I 0.05 (*)    All other components within normal limits    Imaging Review Dg Chest 2 View  05/17/2015   CLINICAL DATA:  Developed shortness of breath today while having dialysis.  EXAM: CHEST  2 VIEW  COMPARISON:  05/05/2015  FINDINGS: The heart is enlarged but stable. Stable surgical changes from bypass surgery. There are chronic lung changes with probable superimposed mild pulmonary edema. No definite pleural effusions.  IMPRESSION: Cardiac enlargement and chronic lung changes with probable superimposed  interstitial edema.   Electronically Signed   By: Rudie Meyer M.D.   On: 05/17/2015 17:28   I have personally reviewed and evaluated these images and lab results as part of my medical decision-making.   EKG Interpretation   Date/Time:  Saturday May 17 2015 15:35:46 EDT Ventricular Rate:  91 PR Interval:  193 QRS Duration: 121 QT Interval:  407 QTC Calculation: 501 R Axis:   95 Text Interpretation:  Sinus or ectopic atrial rhythm Ventricular premature  complex Consider left ventricular hypertrophy Nonspecific T abnormalities,  inferior leads Prolonged QT interval since last tracing no significant  change Confirmed by MILLER  MD, BRIAN ([id number]) on 05/17/2015 3:49:46 PM      MDM   Final diagnoses:  COPD exacerbation    The patient has vital signs which are unremarkable, she does have some exam findings concerning for fluid overload, will need evaluation with chest x-ray labs, I'm concerned that she did not get along of treatment at dialysis and me that still be in fluid overloaded state. We'll give albuterol treatment, reevaluate.  The patient improved significantly after getting albuterol nebulizer and on repeat exam she has minimal wheezing, is in no distress, speaks in full sentences. X-ray shows mild interstitial edema, this is not surprising since she did not finish dialysis, however labs are unremarkable with no hyperkalemia, troponin is baseline borderline elevated, this does not result from an ischemic insult with her baseline renal failure.  The patient has been informed of all of her results, she is amenable to follow-up and states she can change her dialysis to go on Monday. I feel that she is stable to go home at this time. She will be given medications as below   Meds given in ED:  Medications  albuterol (PROVENTIL) (2.5 MG/3ML) 0.083% nebulizer solution 5 mg (5 mg Nebulization Given 05/17/15 1617)  methylPREDNISolone sodium succinate (SOLU-MEDROL) 125 mg/2 mL  injection 125 mg (125 mg Intravenous Given 05/17/15 1703)  LORazepam (ATIVAN) tablet 0.5 mg (0.5 mg Oral Given 05/17/15 1833)    New Prescriptions   PREDNISONE (DELTASONE) 20 MG TABLET    Take 2 tablets (40 mg total) by mouth daily.      Eber Hong, MD 05/17/15 [id number]

## 2015-05-17 NOTE — ED Notes (Addendum)
PER EMS: pt brought here from Dialysis center with complaints of SOB. She had dialysis today and started to have SOB so she used her inhaler and then restarted dialysis. About one hour and 35 minutes later she started to feel SOB again but it was worse so she was sent here. EMS adm 1 albuterol txt en route due to wheezing and so now patient asymptomatic. BP- 140/70, HR-88, O2-100 on 3L [place].

## 2015-05-17 NOTE — ED Notes (Signed)
Dr Miller at bedside. 

## 2015-05-17 NOTE — Discharge Instructions (Signed)
You have told me that you are able to get dialysis on Monday. Please ensure that you go on Monday for dialysis but if you become more short of breath or swollen or have chest pain return to the hospital immediately. You sure albuterol every 4 hours, for the next 24 hours, prednisone once a day for the next 5 days.  Please obtain all of your results from medical records or have your doctors office obtain the results - share them with your doctor - you should be seen at your doctors office in the next 2 days. Call today to arrange your follow up. Take the medications as prescribed. Please review all of the medicines and only take them if you do not have an allergy to them. Please be aware that if you are taking birth control pills, taking other prescriptions, ESPECIALLY ANTIBIOTICS may make the birth control ineffective - if this is the case, either do not engage in sexual activity or use alternative methods of birth control such as condoms until you have finished the medicine and your family doctor says it is OK to restart them. If you are on a blood thinner such as COUMADIN, be aware that any other medicine that you take may cause the coumadin to either work too much, or not enough - you should have your coumadin level rechecked in next 7 days if this is the case.  ?  It is also a possibility that you have an allergic reaction to any of the medicines that you have been prescribed - Everybody reacts differently to medications and while MOST people have no trouble with most medicines, you may have a reaction such as nausea, vomiting, rash, swelling, shortness of breath. If this is the case, please stop taking the medicine immediately and contact your physician.  ?  You should return to the ER if you develop severe or worsening symptoms.

## 2015-08-18 ENCOUNTER — Other Ambulatory Visit: Payer: Self-pay

## 2015-08-18 DIAGNOSIS — I7090 Unspecified atherosclerosis: Secondary | ICD-10-CM

## 2015-08-28 ENCOUNTER — Ambulatory Visit: Payer: Medicare Other | Admitting: Sports Medicine

## 2015-09-03 ENCOUNTER — Ambulatory Visit: Payer: Medicare Other | Admitting: Sports Medicine

## 2015-09-12 ENCOUNTER — Encounter: Payer: Medicare Other | Admitting: Vascular Surgery

## 2015-09-12 ENCOUNTER — Encounter (HOSPITAL_COMMUNITY): Payer: Medicare Other

## 2015-11-25 ENCOUNTER — Encounter (HOSPITAL_COMMUNITY): Payer: Self-pay

## 2015-11-25 ENCOUNTER — Inpatient Hospital Stay (HOSPITAL_COMMUNITY): Payer: Medicare Other

## 2015-11-25 ENCOUNTER — Emergency Department (HOSPITAL_COMMUNITY): Payer: Medicare Other

## 2015-11-25 ENCOUNTER — Inpatient Hospital Stay (HOSPITAL_COMMUNITY)
Admission: EM | Admit: 2015-11-25 | Discharge: 2015-12-29 | DRG: 871 | Disposition: E | Payer: Medicare Other | Attending: Internal Medicine | Admitting: Internal Medicine

## 2015-11-25 DIAGNOSIS — E8889 Other specified metabolic disorders: Secondary | ICD-10-CM | POA: Diagnosis present

## 2015-11-25 DIAGNOSIS — I255 Ischemic cardiomyopathy: Secondary | ICD-10-CM | POA: Diagnosis present

## 2015-11-25 DIAGNOSIS — Z515 Encounter for palliative care: Secondary | ICD-10-CM | POA: Insufficient documentation

## 2015-11-25 DIAGNOSIS — Z7952 Long term (current) use of systemic steroids: Secondary | ICD-10-CM | POA: Diagnosis not present

## 2015-11-25 DIAGNOSIS — D631 Anemia in chronic kidney disease: Secondary | ICD-10-CM | POA: Diagnosis present

## 2015-11-25 DIAGNOSIS — N2581 Secondary hyperparathyroidism of renal origin: Secondary | ICD-10-CM | POA: Diagnosis present

## 2015-11-25 DIAGNOSIS — Z79899 Other long term (current) drug therapy: Secondary | ICD-10-CM

## 2015-11-25 DIAGNOSIS — J449 Chronic obstructive pulmonary disease, unspecified: Secondary | ICD-10-CM | POA: Diagnosis present

## 2015-11-25 DIAGNOSIS — A419 Sepsis, unspecified organism: Secondary | ICD-10-CM | POA: Diagnosis present

## 2015-11-25 DIAGNOSIS — I252 Old myocardial infarction: Secondary | ICD-10-CM | POA: Diagnosis not present

## 2015-11-25 DIAGNOSIS — Z87891 Personal history of nicotine dependence: Secondary | ICD-10-CM

## 2015-11-25 DIAGNOSIS — J438 Other emphysema: Secondary | ICD-10-CM | POA: Diagnosis not present

## 2015-11-25 DIAGNOSIS — N186 End stage renal disease: Secondary | ICD-10-CM | POA: Diagnosis present

## 2015-11-25 DIAGNOSIS — E872 Acidosis, unspecified: Secondary | ICD-10-CM | POA: Diagnosis present

## 2015-11-25 DIAGNOSIS — E875 Hyperkalemia: Secondary | ICD-10-CM | POA: Diagnosis present

## 2015-11-25 DIAGNOSIS — I248 Other forms of acute ischemic heart disease: Secondary | ICD-10-CM | POA: Diagnosis present

## 2015-11-25 DIAGNOSIS — Z993 Dependence on wheelchair: Secondary | ICD-10-CM | POA: Diagnosis not present

## 2015-11-25 DIAGNOSIS — I5023 Acute on chronic systolic (congestive) heart failure: Secondary | ICD-10-CM | POA: Diagnosis present

## 2015-11-25 DIAGNOSIS — G9340 Encephalopathy, unspecified: Secondary | ICD-10-CM | POA: Diagnosis not present

## 2015-11-25 DIAGNOSIS — I2510 Atherosclerotic heart disease of native coronary artery without angina pectoris: Secondary | ICD-10-CM | POA: Diagnosis present

## 2015-11-25 DIAGNOSIS — Z7951 Long term (current) use of inhaled steroids: Secondary | ICD-10-CM | POA: Diagnosis not present

## 2015-11-25 DIAGNOSIS — I5022 Chronic systolic (congestive) heart failure: Secondary | ICD-10-CM | POA: Diagnosis present

## 2015-11-25 DIAGNOSIS — R7989 Other specified abnormal findings of blood chemistry: Secondary | ICD-10-CM | POA: Diagnosis not present

## 2015-11-25 DIAGNOSIS — R0602 Shortness of breath: Secondary | ICD-10-CM

## 2015-11-25 DIAGNOSIS — R0600 Dyspnea, unspecified: Secondary | ICD-10-CM | POA: Insufficient documentation

## 2015-11-25 DIAGNOSIS — J9600 Acute respiratory failure, unspecified whether with hypoxia or hypercapnia: Secondary | ICD-10-CM | POA: Diagnosis present

## 2015-11-25 DIAGNOSIS — J9601 Acute respiratory failure with hypoxia: Secondary | ICD-10-CM | POA: Diagnosis present

## 2015-11-25 DIAGNOSIS — N390 Urinary tract infection, site not specified: Secondary | ICD-10-CM | POA: Diagnosis present

## 2015-11-25 DIAGNOSIS — R6520 Severe sepsis without septic shock: Secondary | ICD-10-CM | POA: Diagnosis present

## 2015-11-25 DIAGNOSIS — F419 Anxiety disorder, unspecified: Secondary | ICD-10-CM | POA: Diagnosis not present

## 2015-11-25 DIAGNOSIS — D638 Anemia in other chronic diseases classified elsewhere: Secondary | ICD-10-CM | POA: Diagnosis not present

## 2015-11-25 DIAGNOSIS — Z951 Presence of aortocoronary bypass graft: Secondary | ICD-10-CM | POA: Diagnosis not present

## 2015-11-25 DIAGNOSIS — I959 Hypotension, unspecified: Secondary | ICD-10-CM | POA: Diagnosis present

## 2015-11-25 DIAGNOSIS — N189 Chronic kidney disease, unspecified: Secondary | ICD-10-CM | POA: Diagnosis not present

## 2015-11-25 DIAGNOSIS — R627 Adult failure to thrive: Secondary | ICD-10-CM | POA: Diagnosis present

## 2015-11-25 DIAGNOSIS — Z7189 Other specified counseling: Secondary | ICD-10-CM | POA: Insufficient documentation

## 2015-11-25 DIAGNOSIS — Z7982 Long term (current) use of aspirin: Secondary | ICD-10-CM | POA: Diagnosis not present

## 2015-11-25 DIAGNOSIS — E1122 Type 2 diabetes mellitus with diabetic chronic kidney disease: Secondary | ICD-10-CM | POA: Diagnosis present

## 2015-11-25 DIAGNOSIS — I132 Hypertensive heart and chronic kidney disease with heart failure and with stage 5 chronic kidney disease, or end stage renal disease: Secondary | ICD-10-CM | POA: Diagnosis present

## 2015-11-25 DIAGNOSIS — Z95820 Peripheral vascular angioplasty status with implants and grafts: Secondary | ICD-10-CM | POA: Diagnosis not present

## 2015-11-25 DIAGNOSIS — Z66 Do not resuscitate: Secondary | ICD-10-CM | POA: Diagnosis present

## 2015-11-25 DIAGNOSIS — F411 Generalized anxiety disorder: Secondary | ICD-10-CM | POA: Diagnosis not present

## 2015-11-25 DIAGNOSIS — G9341 Metabolic encephalopathy: Secondary | ICD-10-CM | POA: Diagnosis present

## 2015-11-25 DIAGNOSIS — E1151 Type 2 diabetes mellitus with diabetic peripheral angiopathy without gangrene: Secondary | ICD-10-CM | POA: Diagnosis present

## 2015-11-25 DIAGNOSIS — Z7902 Long term (current) use of antithrombotics/antiplatelets: Secondary | ICD-10-CM | POA: Diagnosis not present

## 2015-11-25 DIAGNOSIS — R531 Weakness: Secondary | ICD-10-CM | POA: Diagnosis present

## 2015-11-25 DIAGNOSIS — Z992 Dependence on renal dialysis: Secondary | ICD-10-CM | POA: Diagnosis not present

## 2015-11-25 DIAGNOSIS — R778 Other specified abnormalities of plasma proteins: Secondary | ICD-10-CM

## 2015-11-25 LAB — TROPONIN I: TROPONIN I: 0.34 ng/mL — AB (ref <0.031)

## 2015-11-25 LAB — I-STAT ARTERIAL BLOOD GAS, ED
Acid-Base Excess: 3 mmol/L — ABNORMAL HIGH (ref 0.0–2.0)
Bicarbonate: 27.4 mEq/L — ABNORMAL HIGH (ref 20.0–24.0)
O2 Saturation: 100 %
PCO2 ART: 41.5 mmHg (ref 35.0–45.0)
PH ART: 7.426 (ref 7.350–7.450)
PO2 ART: 253 mmHg — AB (ref 80.0–100.0)
Patient temperature: 98.2
TCO2: 29 mmol/L (ref 0–100)

## 2015-11-25 LAB — CBC WITH DIFFERENTIAL/PLATELET
Basophils Absolute: 0 10*3/uL (ref 0.0–0.1)
Basophils Relative: 0 %
EOS ABS: 0 10*3/uL (ref 0.0–0.7)
EOS PCT: 0 %
HCT: 37.1 % (ref 36.0–46.0)
Hemoglobin: 11.5 g/dL — ABNORMAL LOW (ref 12.0–15.0)
LYMPHS ABS: 0.8 10*3/uL (ref 0.7–4.0)
LYMPHS PCT: 9 %
MCH: 29.8 pg (ref 26.0–34.0)
MCHC: 31 g/dL (ref 30.0–36.0)
MCV: 96.1 fL (ref 78.0–100.0)
MONO ABS: 1 10*3/uL (ref 0.1–1.0)
MONOS PCT: 11 %
Neutro Abs: 7.2 10*3/uL (ref 1.7–7.7)
Neutrophils Relative %: 80 %
PLATELETS: 203 10*3/uL (ref 150–400)
RBC: 3.86 MIL/uL — AB (ref 3.87–5.11)
RDW: 16.3 % — AB (ref 11.5–15.5)
WBC: 9 10*3/uL (ref 4.0–10.5)

## 2015-11-25 LAB — COMPREHENSIVE METABOLIC PANEL
ALBUMIN: 2.9 g/dL — AB (ref 3.5–5.0)
ALT: 20 U/L (ref 14–54)
AST: 44 U/L — AB (ref 15–41)
Alkaline Phosphatase: 104 U/L (ref 38–126)
Anion gap: 21 — ABNORMAL HIGH (ref 5–15)
BUN: 30 mg/dL — AB (ref 6–20)
CHLORIDE: 95 mmol/L — AB (ref 101–111)
CO2: 19 mmol/L — ABNORMAL LOW (ref 22–32)
CREATININE: 4.18 mg/dL — AB (ref 0.44–1.00)
Calcium: 8.8 mg/dL — ABNORMAL LOW (ref 8.9–10.3)
GFR calc Af Amer: 12 mL/min — ABNORMAL LOW (ref >60)
GFR, EST NON AFRICAN AMERICAN: 10 mL/min — AB (ref >60)
GLUCOSE: 123 mg/dL — AB (ref 65–99)
POTASSIUM: 5.1 mmol/L (ref 3.5–5.1)
Sodium: 135 mmol/L (ref 135–145)
Total Bilirubin: 1.4 mg/dL — ABNORMAL HIGH (ref 0.3–1.2)
Total Protein: 8 g/dL (ref 6.5–8.1)

## 2015-11-25 LAB — I-STAT CHEM 8, ED
BUN: 33 mg/dL — AB (ref 6–20)
CREATININE: 4.1 mg/dL — AB (ref 0.44–1.00)
Calcium, Ion: 0.92 mmol/L — ABNORMAL LOW (ref 1.13–1.30)
Chloride: 97 mmol/L — ABNORMAL LOW (ref 101–111)
Glucose, Bld: 124 mg/dL — ABNORMAL HIGH (ref 65–99)
HEMATOCRIT: 40 % (ref 36.0–46.0)
HEMOGLOBIN: 13.6 g/dL (ref 12.0–15.0)
POTASSIUM: 4.8 mmol/L (ref 3.5–5.1)
SODIUM: 133 mmol/L — AB (ref 135–145)
TCO2: 22 mmol/L (ref 0–100)

## 2015-11-25 LAB — URINE MICROSCOPIC-ADD ON

## 2015-11-25 LAB — CREATININE, SERUM
Creatinine, Ser: 4.33 mg/dL — ABNORMAL HIGH (ref 0.44–1.00)
GFR, EST AFRICAN AMERICAN: 11 mL/min — AB (ref >60)
GFR, EST NON AFRICAN AMERICAN: 10 mL/min — AB (ref >60)

## 2015-11-25 LAB — CBC
HEMATOCRIT: 32.9 % — AB (ref 36.0–46.0)
HEMOGLOBIN: 10.3 g/dL — AB (ref 12.0–15.0)
MCH: 29.7 pg (ref 26.0–34.0)
MCHC: 31.3 g/dL (ref 30.0–36.0)
MCV: 94.8 fL (ref 78.0–100.0)
Platelets: 184 10*3/uL (ref 150–400)
RBC: 3.47 MIL/uL — AB (ref 3.87–5.11)
RDW: 16.4 % — AB (ref 11.5–15.5)
WBC: 10.4 10*3/uL (ref 4.0–10.5)

## 2015-11-25 LAB — I-STAT CG4 LACTIC ACID, ED: Lactic Acid, Venous: 4.03 mmol/L (ref 0.5–2.0)

## 2015-11-25 LAB — URINALYSIS, ROUTINE W REFLEX MICROSCOPIC
Glucose, UA: 100 mg/dL — AB
Ketones, ur: 15 mg/dL — AB
NITRITE: NEGATIVE
PH: 5.5 (ref 5.0–8.0)
Protein, ur: >300 mg/dL — AB
SPECIFIC GRAVITY, URINE: 1.02 (ref 1.005–1.030)

## 2015-11-25 LAB — I-STAT TROPONIN, ED: TROPONIN I, POC: 0.31 ng/mL — AB (ref 0.00–0.08)

## 2015-11-25 LAB — AMMONIA: Ammonia: 47 umol/L — ABNORMAL HIGH (ref 9–35)

## 2015-11-25 LAB — LACTIC ACID, PLASMA: LACTIC ACID, VENOUS: 3.6 mmol/L — AB (ref 0.5–2.0)

## 2015-11-25 LAB — ETHANOL

## 2015-11-25 LAB — CBG MONITORING, ED: GLUCOSE-CAPILLARY: 112 mg/dL — AB (ref 65–99)

## 2015-11-25 MED ORDER — IPRATROPIUM-ALBUTEROL 0.5-2.5 (3) MG/3ML IN SOLN
3.0000 mL | RESPIRATORY_TRACT | Status: AC
  Administered 2015-11-25 (×3): 3 mL via RESPIRATORY_TRACT
  Filled 2015-11-25: qty 3

## 2015-11-25 MED ORDER — PANTOPRAZOLE SODIUM 40 MG PO TBEC
40.0000 mg | DELAYED_RELEASE_TABLET | Freq: Every day | ORAL | Status: DC
  Administered 2015-11-27 – 2015-12-01 (×5): 40 mg via ORAL
  Filled 2015-11-25 (×5): qty 1

## 2015-11-25 MED ORDER — ASPIRIN 81 MG PO CHEW
324.0000 mg | CHEWABLE_TABLET | Freq: Once | ORAL | Status: AC
  Administered 2015-11-25: 324 mg via ORAL
  Filled 2015-11-25: qty 4

## 2015-11-25 MED ORDER — PIPERACILLIN-TAZOBACTAM 3.375 G IVPB 30 MIN
3.3750 g | Freq: Once | INTRAVENOUS | Status: AC
  Administered 2015-11-25: 3.375 g via INTRAVENOUS
  Filled 2015-11-25: qty 50

## 2015-11-25 MED ORDER — SODIUM CHLORIDE 0.9 % IV BOLUS (SEPSIS)
1000.0000 mL | INTRAVENOUS | Status: AC
  Administered 2015-11-25: 1000 mL via INTRAVENOUS

## 2015-11-25 MED ORDER — NITROGLYCERIN 0.4 MG SL SUBL: 0.4000 mg | SUBLINGUAL_TABLET | SUBLINGUAL | Status: DC | PRN

## 2015-11-25 MED ORDER — VANCOMYCIN HCL IN DEXTROSE 1-5 GM/200ML-% IV SOLN: 1000.0000 mg | Freq: Once | INTRAVENOUS | Status: DC

## 2015-11-25 MED ORDER — HYDROCORTISONE NA SUCCINATE PF 100 MG IJ SOLR
50.0000 mg | Freq: Two times a day (BID) | INTRAMUSCULAR | Status: DC
  Administered 2015-11-25 – 2015-11-28 (×6): 50 mg via INTRAVENOUS
  Filled 2015-11-25 (×6): qty 2

## 2015-11-25 MED ORDER — DOCUSATE SODIUM 100 MG PO CAPS
100.0000 mg | ORAL_CAPSULE | Freq: Two times a day (BID) | ORAL | Status: DC
  Administered 2015-11-28 – 2015-12-01 (×7): 100 mg via ORAL
  Filled 2015-11-25 (×8): qty 1

## 2015-11-25 MED ORDER — CALCIUM ACETATE (PHOS BINDER) 667 MG PO CAPS
667.0000 mg | ORAL_CAPSULE | Freq: Three times a day (TID) | ORAL | Status: DC
  Administered 2015-11-27 – 2015-11-28 (×2): 667 mg via ORAL
  Filled 2015-11-25 (×2): qty 1

## 2015-11-25 MED ORDER — ARIPIPRAZOLE 5 MG PO TABS
5.0000 mg | ORAL_TABLET | Freq: Every day | ORAL | Status: DC
  Administered 2015-11-27 – 2015-12-01 (×5): 5 mg via ORAL
  Filled 2015-11-25 (×7): qty 1

## 2015-11-25 MED ORDER — PIPERACILLIN-TAZOBACTAM 3.375 G IVPB 30 MIN: 3.3750 g | Freq: Once | INTRAVENOUS | Status: DC

## 2015-11-25 MED ORDER — PIPERACILLIN-TAZOBACTAM IN DEX 2-0.25 GM/50ML IV SOLN
2.2500 g | Freq: Three times a day (TID) | INTRAVENOUS | Status: DC
  Administered 2015-11-26: 2.25 g via INTRAVENOUS
  Filled 2015-11-25 (×6): qty 50

## 2015-11-25 MED ORDER — MOMETASONE FURO-FORMOTEROL FUM 200-5 MCG/ACT IN AERO
2.0000 | INHALATION_SPRAY | Freq: Two times a day (BID) | RESPIRATORY_TRACT | Status: DC
  Administered 2015-11-26 – 2015-11-30 (×6): 2 via RESPIRATORY_TRACT
  Filled 2015-11-25 (×2): qty 8.8

## 2015-11-25 MED ORDER — VANCOMYCIN HCL 10 G IV SOLR
1250.0000 mg | Freq: Once | INTRAVENOUS | Status: AC
  Administered 2015-11-25: 1250 mg via INTRAVENOUS
  Filled 2015-11-25: qty 1250

## 2015-11-25 MED ORDER — HEPARIN SODIUM (PORCINE) 5000 UNIT/ML IJ SOLN
5000.0000 [IU] | Freq: Three times a day (TID) | INTRAMUSCULAR | Status: DC
  Administered 2015-11-25 – 2015-11-26 (×3): 5000 [IU] via SUBCUTANEOUS
  Filled 2015-11-25 (×3): qty 1

## 2015-11-25 MED ORDER — CALCITRIOL 0.5 MCG PO CAPS
1.0000 ug | ORAL_CAPSULE | ORAL | Status: DC
  Administered 2015-11-27 – 2015-11-29 (×2): 1 ug via ORAL
  Filled 2015-11-25 (×2): qty 2

## 2015-11-25 NOTE — ED Notes (Signed)
Dr Toniann Fail spoke with pt and husband, pt is altered and pt is DNR.

## 2015-11-25 NOTE — ED Provider Notes (Signed)
CSN: [id number]     Arrival date & time December 04, 2015  1620 History   First MD Initiated Contact with Patient 12/24/2015 1630     Chief Complaint  Patient presents with  . Vascular Access Problem  . lethargic      (Consider location/radiation/quality/duration/timing/severity/associated sxs/prior Treatment) Patient is a 65 y.o. female presenting with general illness. The history is provided by the patient.  Illness Severity:  Severe Onset quality:  Gradual Duration:  2 days Timing:  Constant Progression:  Worsening Chronicity:  New Associated symptoms: no chest pain, no congestion, no fever, no headaches, no myalgias, no nausea, no rhinorrhea, no shortness of breath, no vomiting and no wheezing    65 yo F with a chief complaint of weakness. The going on for a couple days. Patient is unsure whether any other symptoms that she's been having. Went to dialysis today was noted to be very tired. Finished her full dialysis session and was unable to get up off the chair. Was then transferred by EMS here. Patient denies chest pain shortness breath abdominal pain. Denies fevers or chills. Denies cough or congestion.  Chart review performed patient has a history of being wheelchair-bound secondary to the thought to be chronic multiple organ failure.  Past Medical History  Diagnosis Date  . Chronic kidney disease   . Diabetes mellitus without complication (HCC)   . Hypertension   . COPD (chronic obstructive pulmonary disease) (HCC)   . Ischemic cardiomyopathy   . Anemia   . Family history of anesthesia complication     MOTHER HAD NAUSEA  . CHF (congestive heart failure) (HCC)     CHRONIC SYSTOLIC  . Shortness of breath    Past Surgical History  Procedure Laterality Date  . Knee cartilage surgery Left 2007  . Video assisted thoracoscopy (vats)/ lymph node sampling Left 2007  . Incontinence surgery  ?2006  . Coronary artery bypass graft  2010    x 1   History reviewed. No pertinent family  history. Social History  Substance Use Topics  . Smoking status: Former Smoker -- 80 years    Quit date: 08/30/2008  . Smokeless tobacco: Never Used  . Alcohol Use: No   OB History    No data available     Review of Systems  Constitutional: Negative for fever and chills.  HENT: Negative for congestion and rhinorrhea.   Eyes: Negative for redness and visual disturbance.  Respiratory: Negative for shortness of breath and wheezing.   Cardiovascular: Negative for chest pain and palpitations.  Gastrointestinal: Negative for nausea and vomiting.  Genitourinary: Negative for dysuria and urgency.  Musculoskeletal: Negative for myalgias and arthralgias.  Skin: Negative for pallor and wound.  Neurological: Positive for weakness. Negative for dizziness and headaches.      Allergies  Other; Metformin and related; Statins; Levaquin; Sulfa antibiotics; and Tobradex  Home Medications   Prior to Admission medications   Medication Sig Start Date End Date Taking? Authorizing Provider  ARIPiprazole (ABILIFY) 5 MG tablet Take 5 mg by mouth daily.    Historical Provider, MD  aspirin 81 MG tablet Take 81 mg by mouth daily.    Historical Provider, MD  b complex-vitamin c-folic acid (NEPHRO-VITE) 0.8 MG TABS tablet Take 1 tablet by mouth Every Tuesday,Thursday,and Saturday with dialysis.     Historical Provider, MD  budesonide-formoterol (SYMBICORT) 160-4.5 MCG/ACT inhaler Inhale 2 puffs into the lungs 2 (two) times daily.    Historical Provider, MD  calcitRIOL (ROCALTROL) 0.5 MCG capsule Take  2 capsules (1 mcg total) by mouth Every Tuesday,Thursday,and Saturday with dialysis. 03/07/15   Catarina Hartshorn, MD  calcium acetate (PHOSLO) 667 MG tablet Take 1 tablet by mouth 3 (three) times daily with meals. 03/07/15   Catarina Hartshorn, MD  citalopram (CELEXA) 40 MG tablet Take 40 mg by mouth at bedtime.  04/29/15   Historical Provider, MD  clopidogrel (PLAVIX) 75 MG tablet Take 75 mg by mouth daily. 04/21/15   Historical  Provider, MD  docusate sodium (COLACE) 100 MG capsule Take 100 mg by mouth 2 (two) times daily.     Historical Provider, MD  glycerin adult (GLYCERIN ADULT) 2 G SUPP Place 1 suppository rectally daily as needed for moderate constipation.    Historical Provider, MD  LORazepam (ATIVAN) 0.5 MG tablet Take 1 tablet (0.5 mg total) by mouth 2 (two) times daily as needed for anxiety. Patient taking differently: Take 0.5 mg by mouth 3 (three) times daily as needed for anxiety.  03/07/15   Catarina Hartshorn, MD  nitroGLYCERIN (NITROSTAT) 0.4 MG SL tablet Place 0.4 mg under the tongue every 5 (five) minutes as needed for chest pain.    Historical Provider, MD  omeprazole (PRILOSEC) 40 MG capsule Take 40 mg by mouth daily.    Historical Provider, MD  ondansetron (ZOFRAN) 4 MG tablet Take 4 mg by mouth every 8 (eight) hours as needed for nausea or vomiting.  04/28/15   Historical Provider, MD  oxyCODONE-acetaminophen (PERCOCET/ROXICET) 5-325 MG per tablet Take 1 tablet by mouth every 4 (four) hours as needed for moderate pain. pain 02/24/15   Historical Provider, MD  predniSONE (DELTASONE) 20 MG tablet Take 2 tablets (40 mg total) by mouth daily. 05/17/15   Eber Hong, MD  Senna-Fennel (NATURAL VEGETABLE LAXATIVE PO) Take 1 tablet by mouth 2 (two) times daily.    Historical Provider, MD   BP 115/59 mmHg  Pulse 97  Temp(Src) 98.2 F (36.8 C) (Rectal)  Resp 14  Ht 5\' 4"  (1.626 m)  Wt 136 lb (61.689 kg)  BMI 23.33 kg/m2  SpO2 100% Physical Exam  Constitutional: She is oriented to person, place, and time. She appears well-developed and well-nourished. No distress.  HENT:  Head: Normocephalic and atraumatic.  Eyes: EOM are normal. Pupils are equal, round, and reactive to light.  Neck: Normal range of motion. Neck supple.  Cardiovascular: Normal rate and regular rhythm.  Exam reveals no gallop and no friction rub.   No murmur heard. Pulmonary/Chest: Effort normal. She has no wheezes. She has no rales.  Abdominal:  Soft. She exhibits no distension. There is no tenderness. There is no rebound.  Musculoskeletal: She exhibits no edema or tenderness.  Cold extremities and warm core  Neurological: She is alert and oriented to person, place, and time.  Skin: Skin is warm and dry. She is not diaphoretic.  Psychiatric: She has a normal mood and affect. Her behavior is normal.  Nursing note and vitals reviewed.   ED Course  Procedures (including critical care time) Labs Review Labs Reviewed  AMMONIA - Abnormal; Notable for the following:    Ammonia 47 (*)    All other components within normal limits  COMPREHENSIVE METABOLIC PANEL - Abnormal; Notable for the following:    Chloride 95 (*)    CO2 19 (*)    Glucose, Bld 123 (*)    BUN 30 (*)    Creatinine, Ser 4.18 (*)    Calcium 8.8 (*)    Albumin 2.9 (*)    AST  44 (*)    Total Bilirubin 1.4 (*)    GFR calc non Af Amer 10 (*)    GFR calc Af Amer 12 (*)    Anion gap 21 (*)    All other components within normal limits  CBC WITH DIFFERENTIAL/PLATELET - Abnormal; Notable for the following:    RBC 3.86 (*)    Hemoglobin 11.5 (*)    RDW 16.3 (*)    All other components within normal limits  URINALYSIS, ROUTINE W REFLEX MICROSCOPIC (NOT AT [facility]) - Abnormal; Notable for the following:    Color, Urine AMBER (*)    APPearance CLOUDY (*)    Glucose, UA 100 (*)    Hgb urine dipstick LARGE (*)    Bilirubin Urine SMALL (*)    Ketones, ur 15 (*)    Protein, ur >300 (*)    Leukocytes, UA SMALL (*)    All other components within normal limits  URINE MICROSCOPIC-ADD ON - Abnormal; Notable for the following:    Squamous Epithelial / LPF 6-30 (*)    Bacteria, UA RARE (*)    Casts HYALINE CASTS (*)    All other components within normal limits  I-STAT CHEM 8, ED - Abnormal; Notable for the following:    Sodium 133 (*)    Chloride 97 (*)    BUN 33 (*)    Creatinine, Ser 4.10 (*)    Glucose, Bld 124 (*)    Calcium, Ion 0.92 (*)    All other components  within normal limits  I-STAT CG4 LACTIC ACID, ED - Abnormal; Notable for the following:    Lactic Acid, Venous 4.03 (*)    All other components within normal limits  CBG MONITORING, ED - Abnormal; Notable for the following:    Glucose-Capillary 112 (*)    All other components within normal limits  I-STAT TROPOININ, ED - Abnormal; Notable for the following:    Troponin i, poc 0.31 (*)    All other components within normal limits  I-STAT ARTERIAL BLOOD GAS, ED - Abnormal; Notable for the following:    pO2, Arterial 253.0 (*)    Bicarbonate 27.4 (*)    Acid-Base Excess 3.0 (*)    All other components within normal limits  URINE CULTURE  CULTURE, BLOOD (ROUTINE X 2)  CULTURE, BLOOD (ROUTINE X 2)  ETHANOL  BLOOD GAS, ARTERIAL  BLOOD GAS, ARTERIAL    Imaging Review Ct Head Wo Contrast  11/12/2015  CLINICAL DATA:  Weakness and lethargy after dialysis. EXAM: CT HEAD WITHOUT CONTRAST TECHNIQUE: Contiguous axial images were obtained from the base of the skull through the vertex without intravenous contrast. COMPARISON:  09/22/2015 FINDINGS: Stable mild age related cerebral atrophy, ventriculomegaly and periventricular white matter disease. No acute intracranial findings such as hemispheric infarction or intracranial hemorrhage. No extra-axial fluid collections are identified. No mass lesions. The brainstem and cerebellum are grossly normal. No significant bony findings. The paranasal sinuses and mastoid air cells are clear. The globes are intact. IMPRESSION: Stable would age related cerebral atrophy, ventriculomegaly and periventricular white matter disease. No acute intracranial findings. Electronically Signed   By: Rudie Meyer M.D.   On: 10/30/2015 18:33   Dg Chest Portable 1 View  11/04/2015  CLINICAL DATA:  Shortness of breath EXAM: PORTABLE CHEST 1 VIEW COMPARISON:  Chest radiograph from earlier today. FINDINGS: Stable appearance of the sternotomy wires with discontinuity in the upper most  sternotomy wire. Surgical sutures overlie the left lung base. Stable cardiomediastinal silhouette with mild  cardiomegaly. No pneumothorax. Probable trace bilateral pleural effusions. Mild-to-moderate pulmonary edema is not appreciably changed. IMPRESSION: Mild-to-moderate congestive heart failure, not appreciably changed. Stable trace bilateral pleural effusions. Electronically Signed   By: Delbert Phenix M.D.   On: Dec 02, 2015 19:46   Dg Chest Port 1 View  2015/12/02  CLINICAL DATA:  Weakness and lethargy for 1 day EXAM: PORTABLE CHEST 1 VIEW COMPARISON:  09/15/2015 FINDINGS: There is chronic cardiopericardial enlargement. Patient status post CABG. Grossly stable aortic and hilar contours when accounting for rightward rotation. Postsurgical changes to the left lung. Interstitial coarsening above baseline. No focal consolidation, effusion, or air leak. IMPRESSION: Bilateral interstitial coarsening above prior baseline, edema versus atypical infection. Electronically Signed   By: Marnee Spring M.D.   On: 2015/12/02 17:50   I have personally reviewed and evaluated these images and lab results as part of my medical decision-making.   EKG Interpretation None      MDM   Final diagnoses:  Severe sepsis (HCC)  Acute respiratory failure with hypoxia (HCC)  Elevated troponin    65 yo F with a chief complaint weakness. Patient is unable to quantify this further. Patient is globally weak without any focal findings.   The patient was found to have a positive troponin as well as a lactate of 4. Code sepsis was initiated. Patient was given 2 L of IV fluids. Discussed the case with hospitalist will admit.  Prior to admission patient had sudden shortness of breath. Significant tachypnea working to breathe. Patient has diffuse wheezes. Some erythema spread across her face. No another areas of erythema. Patient was given duo nebs and placed on BiPAP. Had significant improvement of her symptoms. Discussed this  with the hospitalist will place patient on stepdown.  CRITICAL CARE Performed by: Rae Roam   Total critical care time: 85 minutes  Critical care time was exclusive of separately billable procedures and treating other patients.  Critical care was necessary to treat or prevent imminent or life-threatening deterioration.  Critical care was time spent personally by me on the following activities: development of treatment plan with patient and/or surrogate as well as nursing, discussions with consultants, evaluation of patient's response to treatment, examination of patient, obtaining history from patient or surrogate, ordering and performing treatments and interventions, ordering and review of laboratory studies, ordering and review of radiographic studies, pulse oximetry and re-evaluation of patient's condition.  The patients results and plan were reviewed and discussed.   Any x-rays performed were independently reviewed by myself.   Differential diagnosis were considered with the presenting HPI.  Medications  sodium chloride 0.9 % bolus 1,000 mL (0 mLs Intravenous Stopped 2015/12/02 1941)  piperacillin-tazobactam (ZOSYN) IVPB 2.25 g (not administered)  vancomycin (VANCOCIN) 1,250 mg in sodium chloride 0.9 % 250 mL IVPB (0 mg Intravenous Stopped 12/02/2015 1924)  aspirin chewable tablet 324 mg (324 mg Oral Given 2015-12-02 1841)  ipratropium-albuterol (DUONEB) 0.5-2.5 (3) MG/3ML nebulizer solution 3 mL (3 mLs Nebulization Given December 02, 2015 2002)    Filed Vitals:   12-02-15 1930 Dec 02, 2015 1945 02-Dec-2015 2000 December 02, 2015 2030  BP: 121/76 121/61 125/73 115/59  Pulse: 117 113 102 97  Temp:      TempSrc:      Resp: 18 16 13 14   Height:      Weight:      SpO2: 100% 99% 100% 100%    Final diagnoses:  Severe sepsis (HCC)  Acute respiratory failure with hypoxia (HCC)  Elevated troponin    Admission/ observation were discussed  with the admitting physician, patient and/or family and they are  comfortable with the plan.    Melene Plan, DO 11-30-2015 2044

## 2015-11-25 NOTE — ED Notes (Signed)
Lab in room to draw blood. Pt to be transported over to MRI with swat nurse.

## 2015-11-25 NOTE — ED Notes (Addendum)
Resp tech notified of bipap order and in room. Dr Adela Lank gave verbal order to stop fluids. Pt received of the bolus

## 2015-11-25 NOTE — ED Notes (Signed)
Pt states that if her breathing does not get better on bipap that she does want to be intubated and if her heart stops then she does want cpr.

## 2015-11-25 NOTE — ED Notes (Signed)
Pt taken off bipap for a break while being transported to MRI by this RN

## 2015-11-25 NOTE — ED Notes (Signed)
Phlebotomy in room to draw blood  

## 2015-11-25 NOTE — ED Notes (Signed)
Resp Tech in room to draw ABG

## 2015-11-25 NOTE — Progress Notes (Signed)
Pharmacy Antibiotic Note  Kathleen [name] is a 65 y.o. female w/ ESRD on HD admitted on 12-22-15 with sepsis.  Pharmacy has been consulted for Vancomycin and Zosyn dosing. Last HD session was 3/28. CBC pending. Afebrile, LA 4.03.   Plan: Vancomycin 1250mg  IV x1 in the ED  Zosyn 2.25g IV Q8h  F/U HD plans and vancomycin dosing   Height: 5\' 4"  (162.6 cm) Weight: 136 lb (61.689 kg) IBW/kg (Calculated) : 54.7  Temp (24hrs), Avg:98.3 F (36.8 C), Min:98.3 F (36.8 C), Max:98.3 F (36.8 C)   Recent Labs Lab 22-Dec-2015 1753 22-Dec-2015 1754  CREATININE 4.10*  --   LATICACIDVEN  --  4.03*    Estimated Creatinine Clearance: 11.7 mL/min (by C-G formula based on Cr of 4.1).    Allergies  Allergen Reactions  . Other Other (See Comments)    Mycins allergy, [employer] feeding supplement causes high blood sugar   . Metformin And Related Diarrhea  . Statins Other (See Comments)    Side effects of leg pain and aching with "cholesterol medications", pt requested that no statins be given to her from this time forward  . Levaquin [Levofloxacin In D5w] Hives  . Sulfa Antibiotics Hives  . Tobradex [Tobramycin-Dexamethasone] Other (See Comments)    redness    Antimicrobials this admission: 3/28 Vanc>> 3/28 Zosyn>>  Thank you for allowing pharmacy to be a part of this patient's care.  Katherina Wimer C. Marvis Moeller, PharmD Pharmacy Resident  Pager: [phone] Dec 22, 2015 6:12 PM   Pharmacy Code Sepsis Protocol  Time of code sepsis page: 1810 []  Antibiotics delivered at 1840  Were antibiotics ordered at the time of the code sepsis page? Yes Was it required to contact the physician? [x]  Physician not contacted []  Physician contacted to order antibiotics for code sepsis []  Physician contacted to recommend changing antibiotics  Pharmacy consulted for: Vancomycin and zosyn   Anti-infectives    Start     Dose/Rate Route Frequency Ordered Stop   12-22-2015 1815  piperacillin-tazobactam (ZOSYN)  IVPB 3.375 g     3.375 g 100 mL/hr over 30 Minutes Intravenous  Once 12-22-15 1803     2015/12/22 1815  vancomycin (VANCOCIN) IVPB 1000 mg/200 mL premix     1,000 mg 200 mL/hr over 60 Minutes Intravenous  Once December 22, 2015 1803          Nurse education provided: [x]  Minutes left to administer antibiotics to achieve 1 hour goal [x]  Correct order of antibiotic administration [x]  Antibiotic Y-site compatibilities     Amado Andal C. Marvis Moeller, PharmD Pharmacy Resident  Pager: [phone] 12-22-15 6:19 PM

## 2015-11-25 NOTE — ED Notes (Addendum)
Pt called out for having difficulty breathing. This RN in room pt's sat at 70 with good pleth, pt turning bluish discoloration in the face. NRB placed on pt at 15L, spo2 now up to 100%. MD in room. Dr Adela Lank ordered to stop vancomycin.

## 2015-11-25 NOTE — ED Notes (Signed)
Portable in room.  

## 2015-11-25 NOTE — ED Notes (Signed)
Pt's CBG result was 118. Informed Caryn Bee - RN.

## 2015-11-25 NOTE — ED Notes (Signed)
Per [facility], pt went to dialysis this afternoon and wasn't feeling well and lethargic. The dialysis center dialized the patient for 4 hours and the patient remained lethargic so EMS was called and the pt brought here. Pt is lethargic here but following all commands. Pt is alert and oriented to self and place. Pt has fluid filled pocket over left eye and large amount of stool in brief. CBG 114, BP 117/67, 98 HR Sinus, RR 18.

## 2015-11-25 NOTE — ED Notes (Signed)
Pt's Husband is Deria Kulkarni and his phone number is [phone], Cell is [phone]

## 2015-11-25 NOTE — ED Notes (Signed)
Pt on 6L [place] and tolerating well. No visible SOB and spo2 at 100%

## 2015-11-25 NOTE — H&P (Addendum)
[facility] History and Physical  LUZMARIA DEVAUX MWU:[id number] DOB: [birth year] DOA: 10/29/2015  Referring physician: Dr. Adela Lank. PCP: Hal Morales, NP  Specialists: Nephrologist.  Chief Complaint: Weakness and confusion.  History obtained from patient's husband and your physician. Patient is confused.  HPI: MARCEA ROJEK is a 65 y.o. female with history of ESRD on hemodialysis on Tuesdays and Thursdays and Saturday, COPD, CAD status post CABG and peripheral vascular disease status post stenting, chronic systolic heart failure was brought to the ER after patient had dialysis today. As per patient's husband patient has been increasingly weak since Saturday 3 days ago. Patient is unable to move her extremities due to weakness. Has not had any new medications added. Patient had some cough but has not had any shortness of breath prior to admission. Denies any chest pain or nausea vomiting or diarrhea as per the husband. In the ER chest x-ray shows possible infiltrates for which patient has been started on empiric antibiotics. While receiving vancomycin patient did have a brief episode of wheezing. Patient was placed on BiPAP when ABG shows good saturation. On my exam patient looks weak and confused. CT of the head did not show anything acute. UA shows possible UTI. Patient has been admitted for further management.   Review of Systems: As presented in the history of presenting illness, rest negative.  Past Medical History  Diagnosis Date  . Chronic kidney disease   . Diabetes mellitus without complication (HCC)   . Hypertension   . COPD (chronic obstructive pulmonary disease) (HCC)   . Ischemic cardiomyopathy   . Anemia   . Family history of anesthesia complication     MOTHER HAD NAUSEA  . CHF (congestive heart failure) (HCC)     CHRONIC SYSTOLIC  . Shortness of breath    Past Surgical History  Procedure Laterality Date  . Knee cartilage surgery Left 2007  . Video assisted  thoracoscopy (vats)/ lymph node sampling Left 2007  . Incontinence surgery  ?2006  . Coronary artery bypass graft  2010    x 1   Social History:  reports that she quit smoking about 7 years ago. She has never used smokeless tobacco. She reports that she does not drink alcohol or use illicit drugs. Where does patient live At home. Can patient participate in ADLs? Yes.  Allergies  Allergen Reactions  . Other Other (See Comments)    Mycins allergy, Resource Breeze feeding supplement causes high blood sugar   . Metformin And Related Diarrhea  . Statins Other (See Comments)    Side effects of leg pain and aching with "cholesterol medications", pt requested that no statins be given to her from this time forward  . Levaquin [Levofloxacin In D5w] Hives  . Sulfa Antibiotics Hives  . Tobradex [Tobramycin-Dexamethasone] Other (See Comments)    redness    Family History:  Family History  Problem Relation Age of Onset  . Hypertension Neg Hx       Prior to Admission medications   Medication Sig Start Date End Date Taking? Authorizing Provider  ARIPiprazole (ABILIFY) 5 MG tablet Take 5 mg by mouth daily.    Historical Provider, MD  aspirin 81 MG tablet Take 81 mg by mouth daily.    Historical Provider, MD  b complex-vitamin c-folic acid (NEPHRO-VITE) 0.8 MG TABS tablet Take 1 tablet by mouth Every Tuesday,Thursday,and Saturday with dialysis.     Historical Provider, MD  budesonide-formoterol (SYMBICORT) 160-4.5 MCG/ACT inhaler Inhale 2 puffs into the lungs  2 (two) times daily.    Historical Provider, MD  calcitRIOL (ROCALTROL) 0.5 MCG capsule Take 2 capsules (1 mcg total) by mouth Every Tuesday,Thursday,and Saturday with dialysis. 03/07/15   Catarina Hartshorn, MD  calcium acetate (PHOSLO) 667 MG tablet Take 1 tablet by mouth 3 (three) times daily with meals. 03/07/15   Catarina Hartshorn, MD  citalopram (CELEXA) 40 MG tablet Take 40 mg by mouth at bedtime.  04/29/15   Historical Provider, MD  clopidogrel (PLAVIX)  75 MG tablet Take 75 mg by mouth daily. 04/21/15   Historical Provider, MD  docusate sodium (COLACE) 100 MG capsule Take 100 mg by mouth 2 (two) times daily.     Historical Provider, MD  glycerin adult (GLYCERIN ADULT) 2 G SUPP Place 1 suppository rectally daily as needed for moderate constipation.    Historical Provider, MD  LORazepam (ATIVAN) 0.5 MG tablet Take 1 tablet (0.5 mg total) by mouth 2 (two) times daily as needed for anxiety. Patient taking differently: Take 0.5 mg by mouth 3 (three) times daily as needed for anxiety.  03/07/15   Catarina Hartshorn, MD  nitroGLYCERIN (NITROSTAT) 0.4 MG SL tablet Place 0.4 mg under the tongue every 5 (five) minutes as needed for chest pain.    Historical Provider, MD  omeprazole (PRILOSEC) 40 MG capsule Take 40 mg by mouth daily.    Historical Provider, MD  ondansetron (ZOFRAN) 4 MG tablet Take 4 mg by mouth every 8 (eight) hours as needed for nausea or vomiting.  04/28/15   Historical Provider, MD  oxyCODONE-acetaminophen (PERCOCET/ROXICET) 5-325 MG per tablet Take 1 tablet by mouth every 4 (four) hours as needed for moderate pain. pain 02/24/15   Historical Provider, MD  predniSONE (DELTASONE) 20 MG tablet Take 2 tablets (40 mg total) by mouth daily. 05/17/15   Eber Hong, MD  Senna-Fennel (NATURAL VEGETABLE LAXATIVE PO) Take 1 tablet by mouth 2 (two) times daily.    Historical Provider, MD    Physical Exam: Filed Vitals:   11/07/2015 1945 10/31/2015 2000 11/24/2015 2030 11/07/2015 2045  BP: 121/61 125/73 115/59 101/56  Pulse: 113 102 97 94  Temp:      TempSrc:      Resp: 16 13 14 20   Height:      Weight:      SpO2: 99% 100% 100% 100%     General:  Moderately built and nourished.  Eyes: Anicteric no pallor.  ENT: No discharge from the ears eyes nose or mouth.  Neck: No mass felt.  Cardiovascular: S1 and S2 heard.  Respiratory: No rhonchi or crepitations.  Abdomen: Soft nontender bowel sounds present.  Skin: No rash. Multiple bruises on the lower  extremity.  Musculoskeletal: No edema.  Psychiatric: Patient is confused.  Neurologic: Patient is confused.  Labs on Admission:  Basic Metabolic Panel:  Recent Labs Lab 11/05/2015 1753 11/11/2015 1813  NA 133* 135  K 4.8 5.1  CL 97* 95*  CO2  --  19*  GLUCOSE 124* 123*  BUN 33* 30*  CREATININE 4.10* 4.18*  CALCIUM  --  8.8*   Liver Function Tests:  Recent Labs Lab 11/16/2015 1813  AST 44*  ALT 20  ALKPHOS 104  BILITOT 1.4*  PROT 8.0  ALBUMIN 2.9*   No results for input(s): LIPASE, AMYLASE in the last 168 hours.  Recent Labs Lab 11/03/2015 1813  AMMONIA 47*   CBC:  Recent Labs Lab 11/13/2015 1753 11/17/2015 1813  WBC  --  9.0  NEUTROABS  --  7.2  HGB 13.6 11.5*  HCT 40.0 37.1  MCV  --  96.1  PLT  --  203   Cardiac Enzymes: No results for input(s): CKTOTAL, CKMB, CKMBINDEX, TROPONINI in the last 168 hours.  BNP (last 3 results)  Recent Labs  05/05/15 1520  BNP >4500.0*    ProBNP (last 3 results) No results for input(s): PROBNP in the last 8760 hours.  CBG:  Recent Labs Lab 11/27/2015 1746  GLUCAP 112*    Radiological Exams on Admission: Ct Head Wo Contrast  November 27, 2015  CLINICAL DATA:  Weakness and lethargy after dialysis. EXAM: CT HEAD WITHOUT CONTRAST TECHNIQUE: Contiguous axial images were obtained from the base of the skull through the vertex without intravenous contrast. COMPARISON:  09/22/2015 FINDINGS: Stable mild age related cerebral atrophy, ventriculomegaly and periventricular white matter disease. No acute intracranial findings such as hemispheric infarction or intracranial hemorrhage. No extra-axial fluid collections are identified. No mass lesions. The brainstem and cerebellum are grossly normal. No significant bony findings. The paranasal sinuses and mastoid air cells are clear. The globes are intact. IMPRESSION: Stable would age related cerebral atrophy, ventriculomegaly and periventricular white matter disease. No acute intracranial  findings. Electronically Signed   By: Rudie Meyer M.D.   On: 27-Nov-2015 18:33   Dg Chest Portable 1 View  11-27-2015  CLINICAL DATA:  Shortness of breath EXAM: PORTABLE CHEST 1 VIEW COMPARISON:  Chest radiograph from earlier today. FINDINGS: Stable appearance of the sternotomy wires with discontinuity in the upper most sternotomy wire. Surgical sutures overlie the left lung base. Stable cardiomediastinal silhouette with mild cardiomegaly. No pneumothorax. Probable trace bilateral pleural effusions. Mild-to-moderate pulmonary edema is not appreciably changed. IMPRESSION: Mild-to-moderate congestive heart failure, not appreciably changed. Stable trace bilateral pleural effusions. Electronically Signed   By: Delbert Phenix M.D.   On: 27-Nov-2015 19:46   Dg Chest Port 1 View  Nov 27, 2015  CLINICAL DATA:  Weakness and lethargy for 1 day EXAM: PORTABLE CHEST 1 VIEW COMPARISON:  09/15/2015 FINDINGS: There is chronic cardiopericardial enlargement. Patient status post CABG. Grossly stable aortic and hilar contours when accounting for rightward rotation. Postsurgical changes to the left lung. Interstitial coarsening above baseline. No focal consolidation, effusion, or air leak. IMPRESSION: Bilateral interstitial coarsening above prior baseline, edema versus atypical infection. Electronically Signed   By: Marnee Spring M.D.   On: 2015-11-27 17:50     Assessment/Plan Principal Problem:   Acute encephalopathy Active Problems:   ESRD on dialysis (HCC)   Chronic systolic heart failure (HCC)   Severe sepsis (HCC)   Acute respiratory failure (HCC)   COPD (chronic obstructive pulmonary disease) (HCC)   1. Acute encephalopathy - cause not clear. Among the differentials are sepsis from possible UTI and pneumonia. Check blood cultures urine cultures repeat lactic acid checked for calcitonin levels. Patient has been placed on vancomycin and Zosyn. Patient did have some wheezing after receiving first dose of  vancomycin and I did discuss with pharmacist. Maryclare Labrador closely monitor for repeat doses to see if there is any further reaction. Check MRI brain EEG. 2. ESRD on hemodialysis on Tuesday Thursday and Saturday - as per patient has been patient did receive full dialysis today. Closely monitor respiratory status. 3. CAD status post CABG and peripheral vascular disease status post stenting - we will cardiac markers and check EKG. 4. COPD - continue inhalers. Not sure if patient is on prednisone or not. For now I am placing a stress dose steroids. 5. Chronic anemia - follow CBC. 6. Chronic systolic heart  failure last EF measured was 25-30% - volume status per nephrology.   DVT Prophylaxis heparin.  Code Status: DO NOT RESUSCITATE as per the husband.  Family Communication: Patient's husband.  Disposition Plan: Admit to inpatient.    Ranald Alessio N. [facility] Pager [phone].  If 7PM-7AM, please contact night-coverage [email or web address] Password [facility] 11/09/2015, 10:28 PM

## 2015-11-26 ENCOUNTER — Inpatient Hospital Stay (HOSPITAL_COMMUNITY): Payer: Medicare Other

## 2015-11-26 DIAGNOSIS — G9340 Encephalopathy, unspecified: Secondary | ICD-10-CM

## 2015-11-26 LAB — IRON AND TIBC
Iron: 46 ug/dL (ref 28–170)
SATURATION RATIOS: 25 % (ref 10.4–31.8)
TIBC: 188 ug/dL — AB (ref 250–450)
UIBC: 142 ug/dL

## 2015-11-26 LAB — VITAMIN B12: VITAMIN B 12: 593 pg/mL (ref 180–914)

## 2015-11-26 LAB — COMPREHENSIVE METABOLIC PANEL
ALBUMIN: 2.3 g/dL — AB (ref 3.5–5.0)
ALK PHOS: 79 U/L (ref 38–126)
ALT: 17 U/L (ref 14–54)
ANION GAP: 17 — AB (ref 5–15)
AST: 33 U/L (ref 15–41)
BILIRUBIN TOTAL: 1.1 mg/dL (ref 0.3–1.2)
BUN: 37 mg/dL — AB (ref 6–20)
CALCIUM: 8.5 mg/dL — AB (ref 8.9–10.3)
CO2: 22 mmol/L (ref 22–32)
Chloride: 97 mmol/L — ABNORMAL LOW (ref 101–111)
Creatinine, Ser: 4.68 mg/dL — ABNORMAL HIGH (ref 0.44–1.00)
GFR calc Af Amer: 10 mL/min — ABNORMAL LOW (ref >60)
GFR, EST NON AFRICAN AMERICAN: 9 mL/min — AB (ref >60)
GLUCOSE: 161 mg/dL — AB (ref 65–99)
POTASSIUM: 4.5 mmol/L (ref 3.5–5.1)
Sodium: 136 mmol/L (ref 135–145)
TOTAL PROTEIN: 6.8 g/dL (ref 6.5–8.1)

## 2015-11-26 LAB — CBC
HEMATOCRIT: 30.7 % — AB (ref 36.0–46.0)
Hemoglobin: 9.8 g/dL — ABNORMAL LOW (ref 12.0–15.0)
MCH: 30.1 pg (ref 26.0–34.0)
MCHC: 31.9 g/dL (ref 30.0–36.0)
MCV: 94.2 fL (ref 78.0–100.0)
Platelets: 190 10*3/uL (ref 150–400)
RBC: 3.26 MIL/uL — ABNORMAL LOW (ref 3.87–5.11)
RDW: 16.4 % — AB (ref 11.5–15.5)
WBC: 8.1 10*3/uL (ref 4.0–10.5)

## 2015-11-26 LAB — GLUCOSE, CAPILLARY
GLUCOSE-CAPILLARY: 127 mg/dL — AB (ref 65–99)
GLUCOSE-CAPILLARY: 153 mg/dL — AB (ref 65–99)
GLUCOSE-CAPILLARY: 144 mg/dL — AB (ref 65–99)
GLUCOSE-CAPILLARY: 115 mg/dL — AB (ref 65–99)
GLUCOSE-CAPILLARY: 110 mg/dL — AB (ref 65–99)
Glucose-Capillary: 117 mg/dL — ABNORMAL HIGH (ref 65–99)
Glucose-Capillary: 119 mg/dL — ABNORMAL HIGH (ref 65–99)

## 2015-11-26 LAB — TROPONIN I
TROPONIN I: 0.43 ng/mL — AB (ref <0.031)
TROPONIN I: 0.78 ng/mL — AB (ref <0.031)
Troponin I: 0.79 ng/mL (ref <0.031)

## 2015-11-26 LAB — FERRITIN

## 2015-11-26 LAB — RETICULOCYTES
RBC.: 3.19 MIL/uL — AB (ref 3.87–5.11)
RETIC CT PCT: 2.5 % (ref 0.4–3.1)
Retic Count, Absolute: 79.8 10*3/uL (ref 19.0–186.0)

## 2015-11-26 LAB — LACTIC ACID, PLASMA: LACTIC ACID, VENOUS: 3.8 mmol/L — AB (ref 0.5–2.0)

## 2015-11-26 LAB — PROCALCITONIN: Procalcitonin: 4.01 ng/mL

## 2015-11-26 LAB — FOLATE: Folate: 25.4 ng/mL (ref >5.9)

## 2015-11-26 LAB — MRSA PCR SCREENING: MRSA by PCR: NEGATIVE

## 2015-11-26 MED ORDER — ASPIRIN 81 MG PO CHEW
81.0000 mg | CHEWABLE_TABLET | Freq: Every day | ORAL | Status: DC
  Administered 2015-11-26 – 2015-12-01 (×6): 81 mg via ORAL
  Filled 2015-11-26 (×6): qty 1

## 2015-11-26 MED ORDER — SODIUM CHLORIDE 0.9 % IV BOLUS (SEPSIS)
250.0000 mL | Freq: Once | INTRAVENOUS | Status: AC
  Administered 2015-11-26: 250 mL via INTRAVENOUS

## 2015-11-26 MED ORDER — VANCOMYCIN HCL IN DEXTROSE 750-5 MG/150ML-% IV SOLN: 750.0000 mg | INTRAVENOUS | Status: DC

## 2015-11-26 MED ORDER — HEPARIN BOLUS VIA INFUSION
1500.0000 [IU] | Freq: Once | INTRAVENOUS | Status: AC
  Administered 2015-11-26: 1500 [IU] via INTRAVENOUS
  Filled 2015-11-26: qty 1500

## 2015-11-26 MED ORDER — METOPROLOL TARTRATE 1 MG/ML IV SOLN
2.5000 mg | Freq: Three times a day (TID) | INTRAVENOUS | Status: DC
  Administered 2015-11-26 – 2015-11-29 (×7): 2.5 mg via INTRAVENOUS
  Filled 2015-11-26 (×8): qty 5

## 2015-11-26 MED ORDER — DARBEPOETIN ALFA 100 MCG/0.5ML IJ SOSY
100.0000 ug | PREFILLED_SYRINGE | INTRAMUSCULAR | Status: DC
  Administered 2015-11-27: 100 ug via INTRAVENOUS
  Filled 2015-11-26: qty 0.5

## 2015-11-26 MED ORDER — CALCITRIOL 0.5 MCG PO CAPS: 1.0000 ug | ORAL_CAPSULE | Freq: Every day | ORAL | Status: DC

## 2015-11-26 MED ORDER — HEPARIN (PORCINE) IN NACL 100-0.45 UNIT/ML-% IJ SOLN
1150.0000 [IU]/h | INTRAMUSCULAR | Status: DC
  Administered 2015-11-26: 750 [IU]/h via INTRAVENOUS
  Administered 2015-11-28: 850 [IU]/h via INTRAVENOUS
  Administered 2015-11-29: 1000 [IU]/h via INTRAVENOUS
  Filled 2015-11-26 (×4): qty 250

## 2015-11-26 NOTE — Progress Notes (Addendum)
[facility] TEAM 1 - Stepdown/ICU TEAM PROGRESS NOTE  ROSLYN ELSE ZOX:[id number] DOB: 01-Nov-1950 DOA: 10/29/2015 PCP: Hal Morales, NP  Admit HPI / Brief Narrative: 65 y.o. female with history of ESRD on dialysis T/Th/Sat, COPD, CAD status post CABG, PVD status post stenting, and chronic systolic heart failure who was brought to the ER c/o increasing weakness over 3 days. Patient was unable to move her extremities due to weakness.  In the ER chest x-ray was w/o clear infiltrate.  Patient was started on empiric antibiotics. CT of the head did not show anything acute. UA noted possible UTI.  HPI/Subjective: The pt is sedate.  Upon tactile stimulation she will open her eyes and answer a few limited questions, but she is quite altered and lethargic. There is no family present at the time of my visit.    Assessment/Plan:  Acute encephalopathy - generalized weakness  MRI brain unrevealing - CT brain unrevealing - WBC normal - EEG pending - complete metabolic eval to be completed - no clear etiology for AMS at present   Modestly elevated ammonia  LFTs not markedly elevated - currently not safe for oral meds - follow trend - if climbs or if mental status does not improve will consider lactulose enema  Possible UTI UA abnormal, but not entirely convincing for UTI - f/u culture data - hold abx for now   Mildly elevated troponin Peaked at 0.43 thus far - follow trend - she is not able to tell me if she is having chest pain - check EKG  Chronic systolic heart failure last EF measured was 25-30% Modestly volume overloaded on exam - reportedly completed HD yesterday - volume management per HD  [facility] Weights   11/09/2015 1627 11/26/15 0133  Weight: 61.689 kg (136 lb) 61.5 kg (135 lb 9.3 oz)   Lactic acidosis  Improved initially, but now climbing again - currently 3.8 - on exam she is modestly volume overloaded, and she is on HD - will not hydrate further   ESRD on hemodialysis on  T/Th/Sat Nephrology to follow - no indication for acute emergent HD at this time   CAD status post CABG - PVD status post stenting  COPD Well compensated at present   Chronic anemia follow CBC  MRSA screen NEGATIVE  Code Status: NO CODE BLUE  Family Communication: no family present at time of exam Disposition Plan: SDU  Consultants: none  Procedures: none  Antibiotics: Vanc 3/28  Zosyn 3/28   DVT prophylaxis: SQ heparin   Objective: Blood pressure 110/55, pulse 82, temperature 97.2 F (36.2 C), temperature source Axillary, resp. rate 10, height  (1.626 m), weight 61.5 kg (135 lb 9.3 oz), SpO2 94 %.  Intake/Output Summary (Last 24 hours) at 11/26/15 0855 Last data filed at 11/26/15 0700  Gross per 24 hour  Intake    550 ml  Output      0 ml  Net    550 ml   Exam: General: No acute respiratory distress - lethargic - obtunded  Lungs: fine crackles th/o all fields - no wheezing  Cardiovascular: Regular rate and rhythm without murmur gallop or rub  Abdomen: Nontender, nondistended, soft, bowel sounds positive, no rebound, no ascites, no appreciable mass Extremities: No significant cyanosis, or clubbing;  1+ edema bilateral lower extremities Neuro:  Able to move fingers and toes - does not follow most simple commands - unable to cooperate w/ exam - no posturing - no facial asymmetry   Data Reviewed:  Basic Metabolic Panel:  Recent Labs Lab 12/15/2015 1753 12/15/15 1813 December 15, 2015 2306 11/26/15 0615  NA 133* 135  --  136  K 4.8 5.1  --  4.5  CL 97* 95*  --  97*  CO2  --  19*  --  22  GLUCOSE 124* 123*  --  161*  BUN 33* 30*  --  37*  CREATININE 4.10* 4.18* 4.33* 4.68*  CALCIUM  --  8.8*  --  8.5*    CBC:  Recent Labs Lab 12-15-15 1753 12/15/15 1813 12/15/15 2306 11/26/15 0615  WBC  --  9.0 10.4 8.1  NEUTROABS  --  7.2  --   --   HGB 13.6 11.5* 10.3* 9.8*  HCT 40.0 37.1 32.9* 30.7*  MCV  --  96.1 94.8 94.2  PLT  --  203 184 190   Liver  Function Tests:  Recent Labs Lab 15-Dec-2015 1813 11/26/15 0615  AST 44* 33  ALT 20 17  ALKPHOS 104 79  BILITOT 1.4* 1.1  PROT 8.0 6.8  ALBUMIN 2.9* 2.3*    Recent Labs Lab December 15, 2015 1813  AMMONIA 47*   Cardiac Enzymes:  Recent Labs Lab 12/15/15 2306 11/26/15 0153  TROPONINI 0.34* 0.43*    CBG:  Recent Labs Lab Dec 15, 2015 1746 11/26/15 0217 11/26/15 0345 11/26/15 0809  GLUCAP 112* 127* 153* 144*    Recent Results (from the past 240 hour(s))  MRSA PCR Screening     Status: None   Collection Time: 11/26/15  1:38 AM  Result Value Ref Range Status   MRSA by PCR NEGATIVE NEGATIVE Final    Comment:        The GeneXpert MRSA Assay (FDA approved for NASAL specimens only), is one component of a comprehensive MRSA colonization surveillance program. It is not intended to diagnose MRSA infection nor to guide or monitor treatment for MRSA infections.      Studies:   Recent x-ray studies have been reviewed in detail by the Attending Physician  Scheduled Meds:  Scheduled Meds: . ARIPiprazole  5 mg Oral Daily  . [START ON 11/27/2015] calcitRIOL  1 mcg Oral Q T,Th,Sa-HD  . calcium acetate  667 mg Oral TID WC  . docusate sodium  100 mg Oral BID  . heparin  5,000 Units Subcutaneous 3 times per day  . hydrocortisone sod succinate (SOLU-CORTEF) inj  50 mg Intravenous Q12H  . mometasone-formoterol  2 puff Inhalation BID  . pantoprazole  40 mg Oral Daily  . piperacillin-tazobactam (ZOSYN)  IV  2.25 g Intravenous 3 times per day  . [START ON 11/27/2015] vancomycin  750 mg Intravenous Q T,Th,Sa-HD    Time spent on care of this patient: 35 mins   Kaeson Kleinert T , MD   [facility] Office  [phone] Pager - Text Page per Loretha Stapler as per below:  On-Call/Text Page:      [email or web address]      password [email or web address]  If 7PM-7AM, please contact night-coverage [email or web address] Password [email or web address] 11/26/2015, 8:55 AM   LOS: 1 day

## 2015-11-26 NOTE — Progress Notes (Signed)
CRITICAL VALUE ALERT  Critical value received:  Lactic Acid 3.8  Date of notification:  03/29  Time of notification:  0300  Critical value read back:Yes.    Nurse who received alert:  Herma Ard, RN  MD notified (1st page):  MD Toniann Fail  Time of first page:  [phone]  MD notified (2nd page):  Time of second page:  Responding MD:  MD Toniann Fail  Time MD responded:  [phone]

## 2015-11-26 NOTE — Progress Notes (Signed)
Dr Sharon Seller text paged to notify of troponin of 0.79

## 2015-11-26 NOTE — Progress Notes (Signed)
EEG Completed; Results Pending  

## 2015-11-26 NOTE — ED Notes (Signed)
Pt transported to MRI by this RN and float RN Kristi. Kristi RN staying with pt in MRI. Pt is on monitor in MRI

## 2015-11-26 NOTE — Care Management Note (Signed)
Case Management Note  Patient Details  Name: ASHAYA BAGBY MRN: [id number] Date of Birth: 1951-03-05  Subjective/Objective:    Pt admitted on 12-23-15 with AMS and respiratory failure.  PTA, pt is wheelchair bound, lives with spouse.                  Action/Plan: Will follow for discharge planning as pt progresses.    Expected Discharge Date:                  Expected Discharge Plan:  Skilled Nursing Facility  In-House Referral:  Clinical Social Work  Discharge planning Services  CM Consult  Post Acute Care Choice:    Choice offered to:     DME Arranged:    DME Agency:     HH Arranged:    HH Agency:     Status of Service:  In process, will continue to follow  Medicare Important Message Given:    Date Medicare IM Given:    Medicare IM give by:    Date Additional Medicare IM Given:    Additional Medicare Important Message give by:     If discussed at Long Length of Stay Meetings, dates discussed:    Additional Comments:  Quintella Baton, RN, BSN  Trauma/Neuro ICU Case Manager [phone]

## 2015-11-26 NOTE — Procedures (Signed)
HPI:  65 y/o with encephalopathy  TECHNICAL SUMMARY:  A multichannel referential and bipolar montage EEG using the standard international 10-20 system was performed on the patient described as lethargic.  The dominant background activity consists of 6.5-7 hertz activity seen most prominantly over the posterior head region.  The backgound activity is nonreactive to eye opening and closing procedures.  Minimal beta activity is noted.  ACTIVATION:  Stepwise photic stimulation and hyperventilation are not performed.  EPILEPTIFORM ACTIVITY:  There were no spikes, sharp waves or paroxysmal activity.  SLEEP:  No physiologic sleep  CARDIAC:  The EKG lead revealed a regular sinus rhythm.  IMPRESSION:  This is an abnormal EEG demonstrating a mild diffuse slowing of electrocerebral activity.  This can be seen in a wide variety of encephalopathic state including those of a toxic, metabolic, or degenerative nature.  There were no focal, hemispheric, or lateralizing features.  No epileptiform activity was recorded.

## 2015-11-26 NOTE — Progress Notes (Signed)
ANTICOAGULATION CONSULT NOTE - Initial Consult  Pharmacy Consult for heparin dosing Indication: chest pain/ACS  Allergies  Allergen Reactions  . Other Other (See Comments)    Mycins allergy, Resource Breeze feeding supplement causes high blood sugar   . Vancomycin Anaphylaxis    Wheezing and respiratory distress noted when given 11/24/15  . Metformin And Related Diarrhea  . Rosuvastatin Calcium Other (See Comments)    Side effects of leg pain and aching with "cholesterol medications", pt requested that no statins be given to her from this time forward  . Statins Other (See Comments)    Side effects of leg pain and aching with "cholesterol medications", pt requested that no statins be given to her from this time forward  . Carvedilol Nausea Only    D/c'd 05/2015 [facility]  . Latex Rash  . Levaquin [Levofloxacin In D5w] Hives  . Levofloxacin Rash  . Sulfa Antibiotics Hives  . Tobradex [Tobramycin-Dexamethasone] Other (See Comments)    redness    Patient Measurements: Height:  (162.6 cm) Weight: 135 lb 9.3 oz (61.5 kg) IBW/kg (Calculated) : 54.7 Heparin Dosing Weight: 61.5 kg   Vital Signs: Temp: 97.4 F (36.3 C) (03/29 1600) Temp Source: Axillary (03/29 1600) BP: 99/61 mmHg (03/29 1600) Pulse Rate: 79 (03/29 1600)  Labs:  Recent Labs  11/01/2015 1813  11/22/2015 2306 11/26/15 0153 11/26/15 0615 11/26/15 1030 11/26/15 1655  HGB 11.5*  --  10.3*  --  9.8*  --   --   HCT 37.1  --  32.9*  --  30.7*  --   --   PLT 203  --  184  --  190  --   --   CREATININE 4.18*  --  4.33*  --  4.68*  --   --   TROPONINI  --   < > 0.34* 0.43*  --  0.78* 0.79*  < > = values in this interval not displayed.  Estimated Creatinine Clearance: 10.2 mL/min (by C-G formula based on Cr of 4.68).   Medical History: Past Medical History  Diagnosis Date  . Chronic kidney disease   . Diabetes mellitus without complication (HCC)   . Hypertension   . COPD (chronic obstructive pulmonary  disease) (HCC)   . Ischemic cardiomyopathy   . Anemia   . Family history of anesthesia complication     MOTHER HAD NAUSEA  . CHF (congestive heart failure) (HCC)     CHRONIC SYSTOLIC  . Shortness of breath     Medications:  Prescriptions prior to admission  Medication Sig Dispense Refill Last Dose  . budesonide-formoterol (SYMBICORT) 160-4.5 MCG/ACT inhaler Inhale 2 puffs into the lungs 2 (two) times daily.   unkn  . calcium acetate (PHOSLO) 667 MG capsule Take 3 capsules by mouth 3 (three) times daily with meals.   Past Week at Unknown time  . clopidogrel (PLAVIX) 75 MG tablet Take 75 mg by mouth daily.   Past Week at Unknown time  . donepezil (ARICEPT) 5 MG tablet Take 1 tablet by mouth at bedtime.   Past Week at Unknown time  . gabapentin (NEURONTIN) 300 MG capsule Take 300 mg by mouth at bedtime.   Past Week at Unknown time  . imipramine (TOFRANIL) 50 MG tablet Take 2 tablets by mouth at bedtime.   Past Week at Unknown time  . LORazepam (ATIVAN) 0.5 MG tablet Take 1 tablet (0.5 mg total) by mouth 2 (two) times daily as needed for anxiety. (Patient taking differently: Take 0.5 mg  by mouth 3 (three) times daily as needed for anxiety. ) 30 tablet 0 unkn  . metoprolol succinate (TOPROL-XL) 25 MG 24 hr tablet Take 1 tablet by mouth daily.   11/24/2015 at 1000  . nitroGLYCERIN (NITROSTAT) 0.4 MG SL tablet Place 0.4 mg under the tongue every 5 (five) minutes as needed for chest pain.   unkn  . omeprazole (PRILOSEC) 40 MG capsule Take 40 mg by mouth daily.   Past Week at Unknown time  . oxyCODONE-acetaminophen (PERCOCET/ROXICET) 5-325 MG per tablet Take 1 tablet by mouth every 4 (four) hours as needed for moderate pain. pain   unkn  . aspirin 81 MG tablet Take 81 mg by mouth daily.   05/12/2015 at Unknown time  . b complex-vitamin c-folic acid (NEPHRO-VITE) 0.8 MG TABS tablet Take 1 tablet by mouth Every Tuesday,Thursday,and Saturday with dialysis.    Past Week at Unknown time  . calcitRIOL  (ROCALTROL) 0.5 MCG capsule Take 2 capsules (1 mcg total) by mouth Every Tuesday,Thursday,and Saturday with dialysis. 14 capsule 0 Past Week at Unknown time  . docusate sodium (COLACE) 100 MG capsule Take 100 mg by mouth 2 (two) times daily.    unkn  . glycerin adult (GLYCERIN ADULT) 2 G SUPP Place 1 suppository rectally daily as needed for moderate constipation.   unkn  . ondansetron (ZOFRAN) 4 MG tablet Take 4 mg by mouth every 8 (eight) hours as needed for nausea or vomiting.    unkn  . Senna-Fennel (NATURAL VEGETABLE LAXATIVE PO) Take 1 tablet by mouth 2 (two) times daily.   unkn   Scheduled:  . ARIPiprazole  5 mg Oral Daily  . aspirin  81 mg Oral Daily  . [START ON 11/27/2015] calcitRIOL  1 mcg Oral Q T,Th,Sa-HD  . calcium acetate  667 mg Oral TID WC  . [START ON 11/27/2015] darbepoetin (ARANESP) injection - DIALYSIS  100 mcg Intravenous Q Thu-HD  . docusate sodium  100 mg Oral BID  . hydrocortisone sod succinate (SOLU-CORTEF) inj  50 mg Intravenous Q12H  . metoprolol  2.5 mg Intravenous 3 times per day  . mometasone-formoterol  2 puff Inhalation BID  . pantoprazole  40 mg Oral Daily    Assessment: 69 yoF, pharmacy consulted to dose heparin for NSTEMI  Goal of Therapy:  Heparin level 0.3-0.7 units/ml Monitor platelets by anticoagulation protocol: Yes   Plan:  Give 1500 units bolus x 1 Start heparin infusion at 750 units/hr Check anti-Xa level in 8 hours and daily while on heparin Continue to monitor H&H and platelets   Thank you for allowing [place] to participate in this patients care. Signe Colt, PharmD Pager: [phone]  11/26/2015,6:14 PM

## 2015-11-26 NOTE — Consult Note (Signed)
[facility] Renal Consultation Note  Indication for Consultation:  Management of ESRD/hemodialysis; anemia, hypertension/volume and secondary hyperparathyroidism  HPI: Kathleen [name] is a 65 y.o. female admitted with Acute encephalopathy - generalized weakness yesterday. History per chart and from [facility] RN. Incontinent of Stool  at home before HD per husband and progressive weakness past 3 days. Finished HD yesterday  But had AMS end of HD "not able to answer questions except for repeating YES, Yes . " bp was stable  For her pre 143/75 and post 129/72.    Per admit team =In the ER CXR showed possible infiltrates patient started on empiric antibiotics. While receiving vancomycin patient did have a brief episode of wheezing. Patient was placed on BiPAP when ABG shows good saturation." On exam looked weak and confused. CT of the head did not show anything acute. UA shows possible UTI"     Now in  room pleasantly confused= "at Home its Monday" does not know why she is  Here. MRI head = No acute intracranial process identified.Generalized cerebral atrophy with mild to moderate chronic small vessel ischemic disease.      Past Medical History  Diagnosis Date  . Chronic kidney disease   . Diabetes mellitus without complication ([place])   . Hypertension   . COPD (chronic obstructive pulmonary disease) ([place])   . Ischemic cardiomyopathy   . Anemia   . Family history of anesthesia complication     MOTHER HAD NAUSEA  . CHF (congestive heart failure) (H[facility])     CHRONIC SYSTOLIC  . Shortness of breath     Past Surgical History  Procedure Laterality Date  . Knee cartilage surgery Left 2007  . Video assisted thoracoscopy (vats)/ lymph node sampling Left 2007  . Incontinence surgery  ?2006  . Coronary artery bypass graft  2010    x 1      Family History  Problem Relation Age of Onset  . Hypertension Neg Hx       reports that she quit smoking about 7 years ago. She  has never used smokeless tobacco. She reports that she does not drink alcohol or use illicit drugs.   Allergies  Allergen Reactions  . Other Other (See Comments)    Mycins allergy, Resource Breeze feeding supplement causes high blood sugar   . Metformin And Related Diarrhea  . Statins Other (See Comments)    Side effects of leg pain and aching with "cholesterol medications", pt requested that no statins be given to her from this time forward  . Levaquin [Levofloxacin In D5w] Hives  . Sulfa Antibiotics Hives  . Tobradex [Tobramycin-Dexamethasone] Other (See Comments)    redness    Prior to Admission medications   Medication Sig Start Date End Date Taking? Authorizing Provider  ARIPiprazole (ABILIFY) 5 MG tablet Take 5 mg by mouth daily.    Historical Provider, MD  aspirin 81 MG tablet Take 81 mg by mouth daily.    Historical Provider, MD  b complex-vitamin c-folic acid (NEPHRO-VITE) 0.8 MG TABS tablet Take 1 tablet by mouth Every Tuesday,Thursday,and Saturday with dialysis.     Historical Provider, MD  budesonide-formoterol (SYMBICORT) 160-4.5 MCG/ACT inhaler Inhale 2 puffs into the lungs 2 (two) times daily.    Historical Provider, MD  calcitRIOL (ROCALTROL) 0.5 MCG capsule Take 2 capsules (1 mcg total) by mouth Every Tuesday,Thursday,and Saturday with dialysis. 03/07/15   Orson Eva, MD  calcium acetate (PHOSLO) 667 MG tablet Take 1 tablet by mouth 3 (  three) times daily with meals. 03/07/15   Orson Eva, MD  citalopram (CELEXA) 40 MG tablet Take 40 mg by mouth at bedtime.  04/29/15   Historical Provider, MD  clopidogrel (PLAVIX) 75 MG tablet Take 75 mg by mouth daily. 04/21/15   Historical Provider, MD  docusate sodium (COLACE) 100 MG capsule Take 100 mg by mouth 2 (two) times daily.     Historical Provider, MD  glycerin adult (GLYCERIN ADULT) 2 G SUPP Place 1 suppository rectally daily as needed for moderate constipation.    Historical Provider, MD  LORazepam (ATIVAN) 0.5 MG tablet Take 1  tablet (0.5 mg total) by mouth 2 (two) times daily as needed for anxiety. Patient taking differently: Take 0.5 mg by mouth 3 (three) times daily as needed for anxiety.  03/07/15   Orson Eva, MD  nitroGLYCERIN (NITROSTAT) 0.4 MG SL tablet Place 0.4 mg under the tongue every 5 (five) minutes as needed for chest pain.    Historical Provider, MD  omeprazole (PRILOSEC) 40 MG capsule Take 40 mg by mouth daily.    Historical Provider, MD  ondansetron (ZOFRAN) 4 MG tablet Take 4 mg by mouth every 8 (eight) hours as needed for nausea or vomiting.  04/28/15   Historical Provider, MD  oxyCODONE-acetaminophen (PERCOCET/ROXICET) 5-325 MG per tablet Take 1 tablet by mouth every 4 (four) hours as needed for moderate pain. pain 02/24/15   Historical Provider, MD  predniSONE (DELTASONE) 20 MG tablet Take 2 tablets (40 mg total) by mouth daily. 05/17/15   Noemi Chapel, MD  Senna-Fennel (NATURAL VEGETABLE LAXATIVE PO) Take 1 tablet by mouth 2 (two) times daily.    Historical Provider, MD     Anti-infectives    Start     Dose/Rate Route Frequency Ordered Stop   11/27/15 1200  vancomycin (VANCOCIN) IVPB 750 mg/150 ml premix  Status:  Discontinued     750 mg 150 mL/hr over 60 Minutes Intravenous Every T-Th-Sa (Hemodialysis) 11/26/15 0740 11/26/15 0915   11/23/2015 2230  piperacillin-tazobactam (ZOSYN) IVPB 3.375 g     3.375 g 100 mL/hr over 30 Minutes Intravenous  Once 11/26/2015 2227 11/12/2015 2328   11/12/2015 2230  vancomycin (VANCOCIN) IVPB 1000 mg/200 mL premix  Status:  Discontinued     1,000 mg 200 mL/hr over 60 Minutes Intravenous  Once 11/24/2015 2227 11/26/15 0739   10/29/2015 2200  piperacillin-tazobactam (ZOSYN) IVPB 2.25 g  Status:  Discontinued     2.25 g 100 mL/hr over 30 Minutes Intravenous 3 times per day 11/24/2015 1818 11/26/15 0924   10/29/2015 1830  vancomycin (VANCOCIN) 1,250 mg in sodium chloride 0.9 % 250 mL IVPB     1,250 mg 166.7 mL/hr over 90 Minutes Intravenous  Once 11/01/2015 1818 11/19/2015 1924    10/30/2015 1815  piperacillin-tazobactam (ZOSYN) IVPB 3.375 g  Status:  Discontinued     3.375 g 100 mL/hr over 30 Minutes Intravenous  Once 11/21/2015 1803 11/13/2015 1818   11/22/2015 1815  vancomycin (VANCOCIN) IVPB 1000 mg/200 mL premix  Status:  Discontinued     1,000 mg 200 mL/hr over 60 Minutes Intravenous  Once 11/01/2015 1803 11/12/2015 1818      Results for orders placed or performed during the hospital encounter of 11/11/2015 (from the past 48 hour(s))  Urinalysis, Routine w reflex microscopic (not at [facility])     Status: Abnormal   Collection Time: 11/26/2015  5:24 PM  Result Value Ref Range   Color, Urine AMBER (A) YELLOW    Comment: BIOCHEMICALS MAY  BE AFFECTED BY COLOR   APPearance CLOUDY (A) CLEAR   Specific Gravity, Urine 1.020 1.005 - 1.030   pH 5.5 5.0 - 8.0   Glucose, UA 100 (A) NEGATIVE mg/dL   Hgb urine dipstick LARGE (A) NEGATIVE   Bilirubin Urine SMALL (A) NEGATIVE   Ketones, ur 15 (A) NEGATIVE mg/dL   Protein, ur >300 (A) NEGATIVE mg/dL   Nitrite NEGATIVE NEGATIVE   Leukocytes, UA SMALL (A) NEGATIVE  Urine culture     Status: None (Preliminary result)   Collection Time: 11/27/2015  5:24 PM  Result Value Ref Range   Specimen Description URINE, CATHETERIZED    Special Requests NONE    Culture NO GROWTH < 24 HOURS    Report Status PENDING   Urine microscopic-add on     Status: Abnormal   Collection Time: 11/18/2015  5:24 PM  Result Value Ref Range   Squamous Epithelial / LPF 6-30 (A) NONE SEEN   WBC, UA 6-30 0 - 5 WBC/hpf   RBC / HPF 6-30 0 - 5 RBC/hpf   Bacteria, UA RARE (A) NONE SEEN   Casts HYALINE CASTS (A) NEGATIVE    Comment: GRANULAR CAST   Urine-Other AMORPHOUS URATES/PHOSPHATES   Blood Culture (routine x 2)     Status: None (Preliminary result)   Collection Time: 10/29/2015  5:41 PM  Result Value Ref Range   Specimen Description BLOOD RIGHT HAND    Special Requests BOTTLES DRAWN AEROBIC AND ANAEROBIC 5CC    Culture NO GROWTH < 24 HOURS    Report Status PENDING    CBG monitoring, ED     Status: Abnormal   Collection Time: 11/22/2015  5:46 PM  Result Value Ref Range   Glucose-Capillary 112 (H) 65 - 99 mg/dL  I-stat troponin, ED     Status: Abnormal   Collection Time: 11/06/2015  5:52 PM  Result Value Ref Range   Troponin i, poc 0.31 (HH) 0.00 - 0.08 ng/mL   Comment NOTIFIED PHYSICIAN    Comment 3            Comment: Due to the release kinetics of cTnI, a negative result within the first hours of the onset of symptoms does not rule out myocardial infarction with certainty. If myocardial infarction is still suspected, repeat the test at appropriate intervals.   I-Stat Chem 8, ED     Status: Abnormal   Collection Time: 11/14/2015  5:53 PM  Result Value Ref Range   Sodium 133 (L) 135 - 145 mmol/L   Potassium 4.8 3.5 - 5.1 mmol/L   Chloride 97 (L) 101 - 111 mmol/L   BUN 33 (H) 6 - 20 mg/dL   Creatinine, Ser 4.10 (H) 0.44 - 1.00 mg/dL   Glucose, Bld 124 (H) 65 - 99 mg/dL   Calcium, Ion 0.92 (L) 1.13 - 1.30 mmol/L   TCO2 22 0 - 100 mmol/L   Hemoglobin 13.6 12.0 - 15.0 g/dL   HCT 40.0 36.0 - 46.0 %  I-Stat CG4 Lactic Acid, ED     Status: Abnormal   Collection Time: 11/26/2015  5:54 PM  Result Value Ref Range   Lactic Acid, Venous 4.03 (HH) 0.5 - 2.0 mmol/L   Comment NOTIFIED PHYSICIAN   Ammonia     Status: Abnormal   Collection Time: 10/30/2015  6:13 PM  Result Value Ref Range   Ammonia 47 (H) 9 - 35 umol/L  Comprehensive metabolic panel     Status: Abnormal   Collection Time: 11/02/2015  6:13  PM  Result Value Ref Range   Sodium 135 135 - 145 mmol/L   Potassium 5.1 3.5 - 5.1 mmol/L   Chloride 95 (L) 101 - 111 mmol/L   CO2 19 (L) 22 - 32 mmol/L   Glucose, Bld 123 (H) 65 - 99 mg/dL   BUN 30 (H) 6 - 20 mg/dL   Creatinine, Ser 4.18 (H) 0.44 - 1.00 mg/dL   Calcium 8.8 (L) 8.9 - 10.3 mg/dL   Total Protein 8.0 6.5 - 8.1 g/dL   Albumin 2.9 (L) 3.5 - 5.0 g/dL   AST 44 (H) 15 - 41 U/L   ALT 20 14 - 54 U/L   Alkaline Phosphatase 104 38 - 126 U/L    Total Bilirubin 1.4 (H) 0.3 - 1.2 mg/dL   GFR calc non Af Amer 10 (L) >60 mL/min   GFR calc Af Amer 12 (L) >60 mL/min    Comment: (NOTE) The eGFR has been calculated using the CKD EPI equation. This calculation has not been validated in all clinical situations. eGFR's persistently <60 mL/min signify possible Chronic Kidney Disease.    Anion gap 21 (H) 5 - 15  CBC WITH DIFFERENTIAL     Status: Abnormal   Collection Time: 10/31/2015  6:13 PM  Result Value Ref Range   WBC 9.0 4.0 - 10.5 K/uL   RBC 3.86 (L) 3.87 - 5.11 MIL/uL   Hemoglobin 11.5 (L) 12.0 - 15.0 g/dL   HCT 37.1 36.0 - 46.0 %   MCV 96.1 78.0 - 100.0 fL   MCH 29.8 26.0 - 34.0 pg   MCHC 31.0 30.0 - 36.0 g/dL   RDW 16.3 (H) 11.5 - 15.5 %   Platelets 203 150 - 400 K/uL   Neutrophils Relative % 80 %   Neutro Abs 7.2 1.7 - 7.7 K/uL   Lymphocytes Relative 9 %   Lymphs Abs 0.8 0.7 - 4.0 K/uL   Monocytes Relative 11 %   Monocytes Absolute 1.0 0.1 - 1.0 K/uL   Eosinophils Relative 0 %   Eosinophils Absolute 0.0 0.0 - 0.7 K/uL   Basophils Relative 0 %   Basophils Absolute 0.0 0.0 - 0.1 K/uL  Ethanol     Status: None   Collection Time: 11/23/2015  6:13 PM  Result Value Ref Range   Alcohol, Ethyl (B) <5 <5 mg/dL    Comment:        LOWEST DETECTABLE LIMIT FOR SERUM ALCOHOL IS 5 mg/dL FOR MEDICAL PURPOSES ONLY   Blood Culture (routine x 2)     Status: None (Preliminary result)   Collection Time: 11/19/2015  6:40 PM  Result Value Ref Range   Specimen Description BLOOD WRIST RIGHT    Special Requests BOTTLES DRAWN AEROBIC ONLY 8CC    Culture NO GROWTH < 24 HOURS    Report Status PENDING   I-Stat arterial blood gas, ED     Status: Abnormal   Collection Time: 11/10/2015  8:40 PM  Result Value Ref Range   pH, Arterial 7.426 7.350 - 7.450   pCO2 arterial 41.5 35.0 - 45.0 mmHg   pO2, Arterial 253.0 (H) 80.0 - 100.0 mmHg   Bicarbonate 27.4 (H) 20.0 - 24.0 mEq/L   TCO2 29 0 - 100 mmol/L   O2 Saturation 100.0 %   Acid-Base Excess 3.0  (H) 0.0 - 2.0 mmol/L   Patient temperature 98.2 F    Collection site RADIAL, ALLEN'S TEST ACCEPTABLE    Drawn by Operator    Sample type ARTERIAL  Lactic acid, plasma     Status: Abnormal   Collection Time: 11/08/2015 10:57 PM  Result Value Ref Range   Lactic Acid, Venous 3.6 (HH) 0.5 - 2.0 mmol/L    Comment: CRITICAL RESULT CALLED TO, READ BACK BY AND VERIFIED WITH: BROWN,K RN 11/26/2015 0001 JORDANS   Procalcitonin     Status: None   Collection Time: 11/08/2015 11:06 PM  Result Value Ref Range   Procalcitonin 4.01 ng/mL    Comment:        Interpretation: PCT > 2 ng/mL: Systemic infection (sepsis) is likely, unless other causes are known. (NOTE)         ICU PCT Algorithm               Non ICU PCT Algorithm    ----------------------------     ------------------------------         PCT < 0.25 ng/mL                 PCT < 0.1 ng/mL     Stopping of antibiotics            Stopping of antibiotics       strongly encouraged.               strongly encouraged.    ----------------------------     ------------------------------       PCT level decrease by               PCT < 0.25 ng/mL       >= 80% from peak PCT       OR PCT 0.25 - 0.5 ng/mL          Stopping of antibiotics                                             encouraged.     Stopping of antibiotics           encouraged.    ----------------------------     ------------------------------       PCT level decrease by              PCT >= 0.25 ng/mL       < 80% from peak PCT        AND PCT >= 0.5 ng/mL            Continuing antibiotics                                               encouraged.       Continuing antibiotics            encouraged.    ----------------------------     ------------------------------     PCT level increase compared          PCT > 0.5 ng/mL         with peak PCT AND          PCT >= 0.5 ng/mL             Escalation of antibiotics  strongly encouraged.      Escalation  of antibiotics        strongly encouraged.   CBC     Status: Abnormal   Collection Time: 11/01/2015 11:06 PM  Result Value Ref Range   WBC 10.4 4.0 - 10.5 K/uL   RBC 3.47 (L) 3.87 - 5.11 MIL/uL   Hemoglobin 10.3 (L) 12.0 - 15.0 g/dL   HCT 32.9 (L) 36.0 - 46.0 %   MCV 94.8 78.0 - 100.0 fL   MCH 29.7 26.0 - 34.0 pg   MCHC 31.3 30.0 - 36.0 g/dL   RDW 16.4 (H) 11.5 - 15.5 %   Platelets 184 150 - 400 K/uL  Creatinine, serum     Status: Abnormal   Collection Time: 11/24/2015 11:06 PM  Result Value Ref Range   Creatinine, Ser 4.33 (H) 0.44 - 1.00 mg/dL   GFR calc non Af Amer 10 (L) >60 mL/min   GFR calc Af Amer 11 (L) >60 mL/min    Comment: (NOTE) The eGFR has been calculated using the CKD EPI equation. This calculation has not been validated in all clinical situations. eGFR's persistently <60 mL/min signify possible Chronic Kidney Disease.   Troponin I (q 6hr x 3)     Status: Abnormal   Collection Time: 11/04/2015 11:06 PM  Result Value Ref Range   Troponin I 0.34 (H) <0.031 ng/mL    Comment:        PERSISTENTLY INCREASED TROPONIN VALUES IN THE RANGE OF 0.04-0.49 ng/mL CAN BE SEEN IN:       -UNSTABLE ANGINA       -CONGESTIVE HEART FAILURE       -MYOCARDITIS       -CHEST TRAUMA       -ARRYHTHMIAS       -LATE PRESENTING MYOCARDIAL INFARCTION       -COPD   CLINICAL FOLLOW-UP RECOMMENDED.   Lactic acid, plasma     Status: Abnormal   Collection Time: 11/26/15  1:25 AM  Result Value Ref Range   Lactic Acid, Venous 3.8 (HH) 0.5 - 2.0 mmol/L    Comment: CRITICAL RESULT CALLED TO, READ BACK BY AND VERIFIED WITH: Julien Nordmann [phone] WILDERK   MRSA PCR Screening     Status: None   Collection Time: 11/26/15  1:38 AM  Result Value Ref Range   MRSA by PCR NEGATIVE NEGATIVE    Comment:        The GeneXpert MRSA Assay (FDA approved for NASAL specimens only), is one component of a comprehensive MRSA colonization surveillance program. It is not intended to diagnose  MRSA infection nor to guide or monitor treatment for MRSA infections.   Troponin I (q 6hr x 3)     Status: Abnormal   Collection Time: 11/26/15  1:53 AM  Result Value Ref Range   Troponin I 0.43 (H) <0.031 ng/mL    Comment:        PERSISTENTLY INCREASED TROPONIN VALUES IN THE RANGE OF 0.04-0.49 ng/mL CAN BE SEEN IN:       -UNSTABLE ANGINA       -CONGESTIVE HEART FAILURE       -MYOCARDITIS       -CHEST TRAUMA       -ARRYHTHMIAS       -LATE PRESENTING MYOCARDIAL INFARCTION       -COPD   CLINICAL FOLLOW-UP RECOMMENDED.   Glucose, capillary     Status: Abnormal   Collection Time: 11/26/15  2:17 AM  Result Value Ref  Range   Glucose-Capillary 127 (H) 65 - 99 mg/dL  Glucose, capillary     Status: Abnormal   Collection Time: 11/26/15  3:45 AM  Result Value Ref Range   Glucose-Capillary 153 (H) 65 - 99 mg/dL  Comprehensive metabolic panel     Status: Abnormal   Collection Time: 11/26/15  6:15 AM  Result Value Ref Range   Sodium 136 135 - 145 mmol/L   Potassium 4.5 3.5 - 5.1 mmol/L   Chloride 97 (L) 101 - 111 mmol/L   CO2 22 22 - 32 mmol/L   Glucose, Bld 161 (H) 65 - 99 mg/dL   BUN 37 (H) 6 - 20 mg/dL   Creatinine, Ser 4.68 (H) 0.44 - 1.00 mg/dL   Calcium 8.5 (L) 8.9 - 10.3 mg/dL   Total Protein 6.8 6.5 - 8.1 g/dL   Albumin 2.3 (L) 3.5 - 5.0 g/dL   AST 33 15 - 41 U/L   ALT 17 14 - 54 U/L   Alkaline Phosphatase 79 38 - 126 U/L   Total Bilirubin 1.1 0.3 - 1.2 mg/dL   GFR calc non Af Amer 9 (L) >60 mL/min   GFR calc Af Amer 10 (L) >60 mL/min    Comment: (NOTE) The eGFR has been calculated using the CKD EPI equation. This calculation has not been validated in all clinical situations. eGFR's persistently <60 mL/min signify possible Chronic Kidney Disease.    Anion gap 17 (H) 5 - 15  CBC     Status: Abnormal   Collection Time: 11/26/15  6:15 AM  Result Value Ref Range   WBC 8.1 4.0 - 10.5 K/uL   RBC 3.26 (L) 3.87 - 5.11 MIL/uL   Hemoglobin 9.8 (L) 12.0 - 15.0 g/dL   HCT  30.7 (L) 36.0 - 46.0 %   MCV 94.2 78.0 - 100.0 fL   MCH 30.1 26.0 - 34.0 pg   MCHC 31.9 30.0 - 36.0 g/dL   RDW 16.4 (H) 11.5 - 15.5 %   Platelets 190 150 - 400 K/uL  Glucose, capillary     Status: Abnormal   Collection Time: 11/26/15  8:09 AM  Result Value Ref Range   Glucose-Capillary 144 (H) 65 - 99 mg/dL   Comment 1 Notify RN    Comment 2 Document in Chart   Troponin I (q 6hr x 3)     Status: Abnormal   Collection Time: 11/26/15 10:30 AM  Result Value Ref Range   Troponin I 0.78 (HH) <0.031 ng/mL    Comment:        POSSIBLE MYOCARDIAL ISCHEMIA. SERIAL TESTING RECOMMENDED. CRITICAL RESULT CALLED TO, READ BACK BY AND VERIFIED WITH: K.WALTON,RN [id number] 11/26/15 CLARK,S   Vitamin B12     Status: None   Collection Time: 11/26/15 10:30 AM  Result Value Ref Range   Vitamin B-12 593 180 - 914 pg/mL    Comment: (NOTE) This assay is not validated for testing neonatal or myeloproliferative syndrome specimens for Vitamin B12 levels.   Folate     Status: None   Collection Time: 11/26/15 10:30 AM  Result Value Ref Range   Folate 25.4 >5.9 ng/mL  Iron and TIBC     Status: Abnormal   Collection Time: 11/26/15 10:30 AM  Result Value Ref Range   Iron 46 28 - 170 ug/dL   TIBC 188 (L) 250 - 450 ug/dL   Saturation Ratios 25 10.4 - 31.8 %   UIBC 142 ug/dL  Ferritin     Status: Abnormal  Collection Time: 11/26/15 10:30 AM  Result Value Ref Range   Ferritin >7500 (H) 11 - 307 ng/mL  Reticulocytes     Status: Abnormal   Collection Time: 11/26/15 10:30 AM  Result Value Ref Range   Retic Ct Pct 2.5 0.4 - 3.1 %   RBC. 3.19 (L) 3.87 - 5.11 MIL/uL   Retic Count, Manual 79.8 19.0 - 186.0 K/uL  Glucose, capillary     Status: Abnormal   Collection Time: 11/26/15 11:49 AM  Result Value Ref Range   Glucose-Capillary 117 (H) 65 - 99 mg/dL   Comment 1 Notify RN    Comment 2 Document in Chart     ROS: see hpi  Physical Exam: Filed Vitals:   11/26/15 1100 11/26/15 1200  BP: 97/64   Pulse:  81   Temp:  97.2 F (36.2 C)  Resp: 11      General: Adult WF chronically ill appearing ,pleasantly confused , NAD HEENT: [place], MMM  Neck: supple , no jvd Heart: RRR 2/6 sem lsb , no rub or gallop Lungs: CTA  Abdomen: BS pos. , soft , NT, ND Extremities: trace pedal edema Skin:  No overt rash/ warm and dry Neuro: alert Disoriented = "At home/ Monday" / and continued disorientation  When re questioning simple questions 3 min later / able to move upper and lower extrem to request  Dialysis Access: pos bruit  LUA AVF  Dialysis Orders: Center: [facility] on MWF . EDW 61.0 kg HD Bath 3.0 k/ 2.25ca  Time 3.5 hr Heparin none ( per [facility] center RN "was held in past with hip and pelvic fracture. Garth Schlatter not restarted" )Access LUA AVF      PO Calcitriol 1.0 mcg/HD / Mircera 50 q 4 weeks hd  Last given 11/18/15     Other op labs  hgb 10.6  Ca 9.3 phos 8.9  pth 968   Assessment/Plan 1. Acute Encephalopathy / Generalized weakness/ ^ ammonia = 47 with Nl lfts/ ?uti no growth in cs  afeb/? Narcotics  With Oxycodone  And ativan on home med list /=  admit team WU / per OP [facility] RN  She is Wheelchair bound  2. ESRD -  Normal schedule TTS ( [facility]) 3. Hypertension/volume  - bp lowish  97/64   By bed wt at edw  cxr showing trace bilateral pleural effusions.Mild-to-moderate pulmonary edema /  May need to start Midodrine / no bp meds as op  4. Ho CAD / CABG/ with Mildly elavate Troponin 0.43 in setting of  Chronic Systolic HF (EF 53-29%) - vol up on cxr  uf attempt as bp allows as above  5. Anemia   Of ESRD- Aranesp q thurs  Hd monitor hgb  9.8 today/ noted trans fer sat 25 % BUT Ferritn >7500 no fe on hd  6. Metabolic bone disease -  Noted op phos up as op  8.9  ( apparent binder compliance problems  Use vit d on HD with Pth 968 ?change to iv if not able to swallow pill,  Corec ca 9.9 use 2.0 ca bath hd in am    Ernest Haber, PA-C [place] [phone] 11/26/2015, 1:05 PM   I have  seen and examined this patient and agree with plan as outlined by Ernest Haber, PA-C.  She is more oriented at this time.  She knows that she is at [facility], it is March 2017 but couldn't remember which dialysis unit she goes to.  Says her breathing is better and nothing hurts.  Flat affect but in no distress. Broadus John A Libia Fazzini,MD 11/26/2015 3:11 PM

## 2015-11-27 DIAGNOSIS — Z992 Dependence on renal dialysis: Secondary | ICD-10-CM

## 2015-11-27 DIAGNOSIS — J449 Chronic obstructive pulmonary disease, unspecified: Secondary | ICD-10-CM | POA: Diagnosis present

## 2015-11-27 DIAGNOSIS — I5022 Chronic systolic (congestive) heart failure: Secondary | ICD-10-CM | POA: Diagnosis present

## 2015-11-27 DIAGNOSIS — R778 Other specified abnormalities of plasma proteins: Secondary | ICD-10-CM | POA: Diagnosis present

## 2015-11-27 DIAGNOSIS — E872 Acidosis, unspecified: Secondary | ICD-10-CM | POA: Diagnosis present

## 2015-11-27 DIAGNOSIS — J438 Other emphysema: Secondary | ICD-10-CM

## 2015-11-27 DIAGNOSIS — N186 End stage renal disease: Secondary | ICD-10-CM | POA: Diagnosis present

## 2015-11-27 DIAGNOSIS — N189 Chronic kidney disease, unspecified: Secondary | ICD-10-CM

## 2015-11-27 DIAGNOSIS — D631 Anemia in chronic kidney disease: Secondary | ICD-10-CM | POA: Diagnosis present

## 2015-11-27 DIAGNOSIS — R7989 Other specified abnormal findings of blood chemistry: Secondary | ICD-10-CM

## 2015-11-27 LAB — URINE CULTURE: Culture: NO GROWTH

## 2015-11-27 LAB — RPR: RPR Ser Ql: NONREACTIVE

## 2015-11-27 LAB — RENAL FUNCTION PANEL
ALBUMIN: 2.4 g/dL — AB (ref 3.5–5.0)
Anion gap: 18 — ABNORMAL HIGH (ref 5–15)
BUN: 52 mg/dL — AB (ref 6–20)
CALCIUM: 8.8 mg/dL — AB (ref 8.9–10.3)
CO2: 20 mmol/L — ABNORMAL LOW (ref 22–32)
CREATININE: 5.36 mg/dL — AB (ref 0.44–1.00)
Chloride: 99 mmol/L — ABNORMAL LOW (ref 101–111)
GFR calc Af Amer: 9 mL/min — ABNORMAL LOW (ref >60)
GFR, EST NON AFRICAN AMERICAN: 8 mL/min — AB (ref >60)
GLUCOSE: 123 mg/dL — AB (ref 65–99)
PHOSPHORUS: 12.6 mg/dL — AB (ref 2.5–4.6)
Potassium: 5.3 mmol/L — ABNORMAL HIGH (ref 3.5–5.1)
SODIUM: 137 mmol/L (ref 135–145)

## 2015-11-27 LAB — GLUCOSE, CAPILLARY
GLUCOSE-CAPILLARY: 232 mg/dL — AB (ref 65–99)
Glucose-Capillary: 103 mg/dL — ABNORMAL HIGH (ref 65–99)
Glucose-Capillary: 108 mg/dL — ABNORMAL HIGH (ref 65–99)
Glucose-Capillary: 156 mg/dL — ABNORMAL HIGH (ref 65–99)
Glucose-Capillary: 139 mg/dL — ABNORMAL HIGH (ref 65–99)

## 2015-11-27 LAB — LACTIC ACID, PLASMA: LACTIC ACID, VENOUS: 0.7 mmol/L (ref 0.5–2.0)

## 2015-11-27 LAB — TROPONIN I: TROPONIN I: 0.57 ng/mL — AB (ref <0.031)

## 2015-11-27 LAB — CBC
HEMATOCRIT: 30.5 % — AB (ref 36.0–46.0)
Hemoglobin: 9.6 g/dL — ABNORMAL LOW (ref 12.0–15.0)
MCH: 29.1 pg (ref 26.0–34.0)
MCHC: 31.5 g/dL (ref 30.0–36.0)
MCV: 92.4 fL (ref 78.0–100.0)
PLATELETS: 203 10*3/uL (ref 150–400)
RBC: 3.3 MIL/uL — ABNORMAL LOW (ref 3.87–5.11)
RDW: 16.3 % — AB (ref 11.5–15.5)
WBC: 7.4 10*3/uL (ref 4.0–10.5)

## 2015-11-27 LAB — AMMONIA: Ammonia: 22 umol/L (ref 9–35)

## 2015-11-27 LAB — HEPATITIS B SURFACE ANTIGEN: HEP B S AG: NEGATIVE

## 2015-11-27 LAB — HEPARIN LEVEL (UNFRACTIONATED)
HEPARIN UNFRACTIONATED: 0.59 [IU]/mL (ref 0.30–0.70)
Heparin Unfractionated: 0.28 IU/mL — ABNORMAL LOW (ref 0.30–0.70)

## 2015-11-27 LAB — HIV ANTIBODY (ROUTINE TESTING W REFLEX): HIV Screen 4th Generation wRfx: NONREACTIVE

## 2015-11-27 MED ORDER — DARBEPOETIN ALFA 100 MCG/0.5ML IJ SOSY
PREFILLED_SYRINGE | INTRAMUSCULAR | Status: AC
  Filled 2015-11-27: qty 0.5

## 2015-11-27 NOTE — Progress Notes (Signed)
ANTICOAGULATION CONSULT NOTE - Follow Up Consult  Pharmacy Consult for Heparin  Indication: chest pain/ACS  Patient Measurements: Height: 5\' 4"  (162.6 cm) Weight: 135 lb 9.3 oz (61.5 kg) IBW/kg (Calculated) : 54.7  Vital Signs: Temp: 97.5 F (36.4 C) (03/30 0000) Temp Source: Oral (03/30 0000) BP: 115/68 mmHg (03/29 2200) Pulse Rate: 87 (03/29 2200)  Labs:  Recent Labs  11/16/2015 1813  11/07/2015 2306 11/26/15 0153 11/26/15 0615 11/26/15 1030 11/26/15 1655 11/27/15 0320 11/27/15 0325  HGB 11.5*  --  10.3*  --  9.8*  --   --  9.6*  --   HCT 37.1  --  32.9*  --  30.7*  --   --  30.5*  --   PLT 203  --  184  --  190  --   --  203  --   HEPARINUNFRC  --   --   --   --   --   --   --   --  0.28*  CREATININE 4.18*  --  4.33*  --  4.68*  --   --   --   --   TROPONINI  --   < > 0.34* 0.43*  --  0.78* 0.79*  --   --   < > = values in this interval not displayed.  Estimated Creatinine Clearance: 10.2 mL/min (by C-G formula based on Cr of 4.68).    Assessment: Heparin for elevated troponin, HL slightly low this AM, no issues per RN.   Goal of Therapy:  Heparin level 0.3-0.7 units/ml Monitor platelets by anticoagulation protocol: Yes   Plan:  -Increase heparin to 850 units/hr -1200 HL  Abran Duke 11/27/2015,4:04 AM

## 2015-11-27 NOTE — Progress Notes (Signed)
Patient ID: Kathleen [name], female   DOB: April 28, 1951, 64 y.o.   MRN: [id number]  [facility] Progress Note    Subjective:   Hungry and thirsty this morning.   Objective:   BP 113/64 mmHg  Pulse 83  Temp(Src) 97.4 F (36.3 C) (Oral)  Resp 12  Ht 5\' 4"  (1.626 m)  Wt 61.5 kg (135 lb 9.3 oz)  BMI 23.26 kg/m2  SpO2 95%  Intake/Output: I/O last 3 completed shifts: In: 642 [I.V.:92; IV Piggyback:550] Out: 0    Intake/Output this shift:    Weight change:   Physical Exam: Gen:WD WM with slowed mentation but in NAD CVS:no rub Resp:cta YQM:VHQION Ext:no edema, LAVF +T/B  Labs: BMET  Recent Labs Lab 10/30/2015 1753 11/23/2015 1813 11/10/2015 2306 11/26/15 0615 11/27/15 0320  NA 133* 135  --  136 137  K 4.8 5.1  --  4.5 5.3*  CL 97* 95*  --  97* 99*  CO2  --  19*  --  22 20*  GLUCOSE 124* 123*  --  161* 123*  BUN 33* 30*  --  37* 52*  CREATININE 4.10* 4.18* 4.33* 4.68* 5.36*  ALBUMIN  --  2.9*  --  2.3* 2.4*  CALCIUM  --  8.8*  --  8.5* 8.8*  PHOS  --   --   --   --  12.6*   CBC  Recent Labs Lab 11/27/2015 1813 11/13/2015 2306 11/26/15 0615 11/27/15 0320  WBC 9.0 10.4 8.1 7.4  NEUTROABS 7.2  --   --   --   HGB 11.5* 10.3* 9.8* 9.6*  HCT 37.1 32.9* 30.7* 30.5*  MCV 96.1 94.8 94.2 92.4  PLT 203 184 190 203    @IMGRELPRIORS @ Medications:    . ARIPiprazole  5 mg Oral Daily  . aspirin  81 mg Oral Daily  . calcitRIOL  1 mcg Oral Q T,Th,Sa-HD  . calcium acetate  667 mg Oral TID WC  . darbepoetin (ARANESP) injection - DIALYSIS  100 mcg Intravenous Q Thu-HD  . docusate sodium  100 mg Oral BID  . hydrocortisone sod succinate (SOLU-CORTEF) inj  50 mg Intravenous Q12H  . metoprolol  2.5 mg Intravenous 3 times per day  . mometasone-formoterol  2 puff Inhalation BID  . pantoprazole  40 mg Oral Daily   Dialysis Orders: Center: [facility] on MWF . EDW 61.0 kg HD Bath 3.0 k/ 2.25ca Time 3.5 hr Heparin none ( per kid center RN "was held in past with hip and  pelvic fracture. Nathaneil Canary not restarted" )Access LUA AVF  PO Calcitriol 1.0 mcg/HD / Mircera 50 q 4 weeks hd Last given 11/18/15  Other op labs hgb 10.6 Ca 9.3 phos 8.9 pth 968    Assessment/ Plan:   1. Acute Encephalopathy / Generalized weakness/ ^ ammonia = 47 with Nl lfts/ ?uti no growth in cs afeb/? Narcotics With Oxycodone And ativan on home med list /= admit team WU / per OP K[facility] center RN She is Wheelchair bound. 1. Still not completely oriented (knew she was at [facility] and it was March but thought it was 2077 and didn't know the president). 2. ESRD - Normal schedule TTS ( [facility]) 3. Hypertension/volume - bp soft ranging from 90's-110's systolic. May need to start Midodrine / no bp meds as op 4. Ho CAD / CABG/ with Mildly elavate Troponin 0.43 in setting of Chronic Systolic HF (EF 25-30%) - vol up on cxr uf attempt as bp allows as above  5. Anemia Of ESRD- Aranesp q thurs Hd monitor hgb 9.8 today/ noted trans fer sat 25 % BUT Ferritn >7500 no fe on hd  6. Metabolic bone disease - Noted op phos up as op 8.9 ( apparent binder compliance problems Use vit d on HD with Pth 968 ?change to iv if not able to swallow pill, Corec ca 9.9 use 2.0 ca bath hd in am  Irena Cords, MD [facility], [facility] Pager [phone] 11/27/2015, 7:49 AM

## 2015-11-27 NOTE — Progress Notes (Signed)
Pt husband expresses that pt has been having increased weakness for several months now. The weakness has worsened to the point where she was unable to get herself up to bathroom or to bed without requiring his help. He states that without his help she would fall. In the past he stated that she has had the help of [facility] for rehabilitation. Will need order for PT/OT for further evaluation and recommendation.

## 2015-11-27 NOTE — Progress Notes (Signed)
Went in and introduced myself to pt. She was alert and oriented. Stated that she was hungry and ready to eat. Explained to her that she as going to dialysis at the moment and that we would discuss with the doctor when he rounded about a diet. At this time will send patient to dialysis and will assess the patient when returning from dialysis

## 2015-11-27 NOTE — Progress Notes (Signed)
[place] TEAM 1 - Stepdown/ICU TEAM Progress Note  SAPHIRE BARNHART ZOX:[id number] DOB: Dec 27, 1950 DOA: 27-Nov-2015 PCP: Hal Morales, NP  Admit HPI / Brief Narrative: Kathleen [name] is a 65 y.o. WF PMHx ESRD on HD T/Th/Sat, COPD, CAD native artery, Ischemic Cardiomyopathy/Chronic Systolic CHF S/P CABG, PVD S/P stenting, S/P Stenting,   Brought to the ER after patient had HD today. As per patient's husband patient has been increasingly weak since Saturday 3 days ago. Patient is unable to move her extremities due to weakness. Has not had any new medications added. Patient had some cough but has not had any shortness of breath prior to admission. Denies any chest pain or nausea vomiting or diarrhea as per the husband. In the ER chest x-ray shows possible infiltrates for which patient has been started on empiric antibiotics. While receiving vancomycin patient did have a brief episode of wheezing. Patient was placed on BiPAP when ABG shows good saturation. On my exam patient looks weak and confused. CT of the head did not show anything acute. UA shows possible UTI. Patient has been admitted for further management.   HPI/Subjective: 3/30 A/O 4, NAD. Patient does not know dry weight  Assessment/Plan: Acute encephalopathy - generalized weakness  -MRI brain unrevealing - CT brain unrevealing - WBC normal  -EEG ; negative seizure activity -Resolved  Possible UTI -UA abnormal, but not entirely convincing for UTI - f/u culture data - hold abx for now   Mildly elevated troponin -Peaked at 0.79, trending down patient currently asymptomatic -3/30 EKG; when compared to EKG from 3/29 no significant change most likely mild demand ischemia secondary to hypotension, volume overload and ESRD.  Chronic systolic CHF last EF measured was 25-30% -Modestly volume overloaded on exam -Strict in and out -262 ml -Daily weight; admission weight= 61.5 kg        3/30 postdialysis weight= 59.7 kg -volume management  per HD   Lactic acidosis  -Resolved    ESRD on hemodialysis on T/Th/Sat -Per Nephrology   CAD status post CABG - PVD status post stenting  COPD -Well compensated at present   Anemia in chronic kidney disease  -follow CBC   Code Status: FULL Family Communication: no family present at time of exam Disposition Plan: Per nephrology    Consultants: Dr.Joseph Coladonato nephrology   Procedure/Significant Events: EEG 3/29;abnormal EEG demonstrating a mild diffuse slowing of electrocerebral activity. This can be seen in a wide variety of encephalopathic state including those of a toxic, metabolic, or degenerative nature-negative seizure activity   Culture 3/28 urine NGTD 3/28 blood right hand/wrist NGTD 3/29 MRSA by PCR negative   Antibiotics: Vanc 3/28>> 3/29  Zosyn 3/28>> 3/29   DVT prophylaxis: Subcutaneous heparin   Devices    LINES / TUBES:      Continuous Infusions: . heparin 850 Units/hr (11/27/15 0403)    Objective: VITAL SIGNS: Temp: 98 F (36.7 C) (03/30 1745) Temp Source: Oral (03/30 1745) BP: 131/67 mmHg (03/30 1745) Pulse Rate: 100 (03/30 1745) SPO2; FIO2:   Intake/Output Summary (Last 24 hours) at 11/27/15 2002 Last data filed at 11/27/15 1700  Gross per 24 hour  Intake 311.45 ml  Output   1130 ml  Net -818.55 ml     Exam: General: A/O 4, NAD., No acute respiratory distress Eyes: Negative headache, negative scleral hemorrhage ENT: Negative Runny nose, negative gingival bleeding, Neck:  Negative scars, masses, torticollis, lymphadenopathy, JVD Lungs: Clear to auscultation bilaterally without wheezes or crackles Cardiovascular: Regular rate  and rhythm without murmur gallop or rub normal S1 and S2 Abdomen:negative abdominal pain, nondistended, positive soft, bowel sounds, no rebound, no ascites, no appreciable mass Extremities: No significant cyanosis, clubbing, or edema bilateral lower extremities Psychiatric:  Negative  depression, negative anxiety, negative fatigue, negative mania  Neurologic:  Cranial nerves II through XII intact, tongue/uvula midline, all extremities muscle strength 5/5, sensation intact throughout, negative dysarthria, negative expressive aphasia, negative receptive aphasia.   Data Reviewed: Basic Metabolic Panel:  Recent Labs Lab December 07, 2015 1753 12/07/2015 1813 December 07, 2015 2306 11/26/15 0615 11/27/15 0320  NA 133* 135  --  136 137  K 4.8 5.1  --  4.5 5.3*  CL 97* 95*  --  97* 99*  CO2  --  19*  --  22 20*  GLUCOSE 124* 123*  --  161* 123*  BUN 33* 30*  --  37* 52*  CREATININE 4.10* 4.18* 4.33* 4.68* 5.36*  CALCIUM  --  8.8*  --  8.5* 8.8*  PHOS  --   --   --   --  12.6*   Liver Function Tests:  Recent Labs Lab 2015-12-07 1813 11/26/15 0615 11/27/15 0320  AST 44* 33  --   ALT 20 17  --   ALKPHOS 104 79  --   BILITOT 1.4* 1.1  --   PROT 8.0 6.8  --   ALBUMIN 2.9* 2.3* 2.4*   No results for input(s): LIPASE, AMYLASE in the last 168 hours.  Recent Labs Lab 12/07/15 1813 11/27/15 0320  AMMONIA 47* 22   CBC:  Recent Labs Lab Dec 07, 2015 1753 07-Dec-2015 1813 2015/12/07 2306 11/26/15 0615 11/27/15 0320  WBC  --  9.0 10.4 8.1 7.4  NEUTROABS  --  7.2  --   --   --   HGB 13.6 11.5* 10.3* 9.8* 9.6*  HCT 40.0 37.1 32.9* 30.7* 30.5*  MCV  --  96.1 94.8 94.2 92.4  PLT  --  203 184 190 203   Cardiac Enzymes:  Recent Labs Lab 2015-12-07 2306 11/26/15 0153 11/26/15 1030 11/26/15 1655 11/27/15 0320  TROPONINI 0.34* 0.43* 0.78* 0.79* 0.57*   BNP (last 3 results)  Recent Labs  05/05/15 1520  BNP >4500.0*    ProBNP (last 3 results) No results for input(s): PROBNP in the last 8760 hours.  CBG:  Recent Labs Lab 11/26/15 2326 11/27/15 0330 11/27/15 0534 11/27/15 1237 11/27/15 1522  GLUCAP 110* 103* 108* 156* 139*    Recent Results (from the past 240 hour(s))  Urine culture     Status: None   Collection Time: 12-07-15  5:24 PM  Result Value Ref Range  Status   Specimen Description URINE, CATHETERIZED  Final   Special Requests NONE  Final   Culture NO GROWTH 2 DAYS  Final   Report Status 11/27/2015 FINAL  Final  Blood Culture (routine x 2)     Status: None (Preliminary result)   Collection Time: 07-Dec-2015  5:41 PM  Result Value Ref Range Status   Specimen Description BLOOD RIGHT HAND  Final   Special Requests BOTTLES DRAWN AEROBIC AND ANAEROBIC 5CC  Final   Culture NO GROWTH 2 DAYS  Final   Report Status PENDING  Incomplete  Blood Culture (routine x 2)     Status: None (Preliminary result)   Collection Time: 2015-12-07  6:40 PM  Result Value Ref Range Status   Specimen Description BLOOD WRIST RIGHT  Final   Special Requests BOTTLES DRAWN AEROBIC ONLY 8CC  Final   Culture NO  GROWTH 2 DAYS  Final   Report Status PENDING  Incomplete  MRSA PCR Screening     Status: None   Collection Time: 11/26/15  1:38 AM  Result Value Ref Range Status   MRSA by PCR NEGATIVE NEGATIVE Final    Comment:        The GeneXpert MRSA Assay (FDA approved for NASAL specimens only), is one component of a comprehensive MRSA colonization surveillance program. It is not intended to diagnose MRSA infection nor to guide or monitor treatment for MRSA infections.      Studies:  Recent x-ray studies have been reviewed in detail by the Attending Physician  Scheduled Meds:  Scheduled Meds: . ARIPiprazole  5 mg Oral Daily  . aspirin  81 mg Oral Daily  . calcitRIOL  1 mcg Oral Q T,Th,Sa-HD  . calcium acetate  667 mg Oral TID WC  . Darbepoetin Alfa      . darbepoetin (ARANESP) injection - DIALYSIS  100 mcg Intravenous Q Thu-HD  . docusate sodium  100 mg Oral BID  . hydrocortisone sod succinate (SOLU-CORTEF) inj  50 mg Intravenous Q12H  . metoprolol  2.5 mg Intravenous 3 times per day  . mometasone-formoterol  2 puff Inhalation BID  . pantoprazole  40 mg Oral Daily    Time spent on care of this patient: 40 mins   Tarika Mckethan, Roselind Messier , MD  [facility] Office  [phone] Pager - [phone]  On-Call/Text Page:      [email or web address]      password [email or web address]  If 7PM-7AM, please contact night-coverage [email or web address] Password [email or web address] 11/27/2015, 8:02 PM   LOS: 2 days   Care during the described time interval was provided by me .  I have reviewed this patient's available data, including medical history, events of note, physical examination, and all test results as part of my evaluation. I have personally reviewed and interpreted all radiology studies.   Carolyne Littles, MD [phone] Pager

## 2015-11-27 NOTE — Progress Notes (Signed)
ANTICOAGULATION CONSULT NOTE - FOLLOW UP    HL = 0.59 (goal 0.3 - 0.7 units/mL) Heparin dosing weight = 60 kg   Assessment: 64 YOF continues on IV heparin for ACS.  Heparin level is therapeutic; no bleeding reported.  No other sources of heparin identified.   Plan: - Continue heparin gtt at 850 units/hr - F/U AM labs    Vernie Piet D. Laney Potash, PharmD, BCPS 11/27/2015, 5:25 PM

## 2015-11-27 NOTE — Progress Notes (Signed)
New Admission Note:   Arrival Method:  Mental Orientation: Alert x oriented x4  Telemetry: box 23 verified with ccmd  Pain: no complaints Tubes: none  Safety Measures: Safety Fall Prevention Plan has been given, discussed and signed Admission: Completed [facility] [place] Orientation: Patient has been orientated to the room, unit and staff.  Family: family notified by 57M RN  Orders have been reviewed and implemented. Will continue to monitor the patient. Call light has been placed within reach and bed alarm has been activated.   Riesa Pope BSN, RN Phone number: [phone]

## 2015-11-28 ENCOUNTER — Inpatient Hospital Stay (HOSPITAL_COMMUNITY): Payer: Medicare Other

## 2015-11-28 DIAGNOSIS — N189 Chronic kidney disease, unspecified: Secondary | ICD-10-CM

## 2015-11-28 DIAGNOSIS — D631 Anemia in chronic kidney disease: Secondary | ICD-10-CM

## 2015-11-28 DIAGNOSIS — R7989 Other specified abnormal findings of blood chemistry: Secondary | ICD-10-CM

## 2015-11-28 DIAGNOSIS — J449 Chronic obstructive pulmonary disease, unspecified: Secondary | ICD-10-CM

## 2015-11-28 LAB — CBC WITH DIFFERENTIAL/PLATELET
BASOS PCT: 0 %
Basophils Absolute: 0 10*3/uL (ref 0.0–0.1)
EOS ABS: 0 10*3/uL (ref 0.0–0.7)
EOS PCT: 0 %
HCT: 32.7 % — ABNORMAL LOW (ref 36.0–46.0)
Hemoglobin: 10.6 g/dL — ABNORMAL LOW (ref 12.0–15.0)
LYMPHS ABS: 0.5 10*3/uL — AB (ref 0.7–4.0)
Lymphocytes Relative: 9 %
MCH: 30.9 pg (ref 26.0–34.0)
MCHC: 32.4 g/dL (ref 30.0–36.0)
MCV: 95.3 fL (ref 78.0–100.0)
MONOS PCT: 8 %
Monocytes Absolute: 0.5 10*3/uL (ref 0.1–1.0)
Neutro Abs: 5.2 10*3/uL (ref 1.7–7.7)
Neutrophils Relative %: 83 %
PLATELETS: 200 10*3/uL (ref 150–400)
RBC: 3.43 MIL/uL — AB (ref 3.87–5.11)
RDW: 16.8 % — ABNORMAL HIGH (ref 11.5–15.5)
WBC: 6.3 10*3/uL (ref 4.0–10.5)

## 2015-11-28 LAB — COMPREHENSIVE METABOLIC PANEL
ALBUMIN: 2.8 g/dL — AB (ref 3.5–5.0)
ALT: 17 U/L (ref 14–54)
AST: 27 U/L (ref 15–41)
Alkaline Phosphatase: 72 U/L (ref 38–126)
Anion gap: 17 — ABNORMAL HIGH (ref 5–15)
BUN: 30 mg/dL — AB (ref 6–20)
CHLORIDE: 97 mmol/L — AB (ref 101–111)
CO2: 21 mmol/L — ABNORMAL LOW (ref 22–32)
CREATININE: 3.6 mg/dL — AB (ref 0.44–1.00)
Calcium: 9.1 mg/dL (ref 8.9–10.3)
GFR calc Af Amer: 14 mL/min — ABNORMAL LOW (ref >60)
GFR, EST NON AFRICAN AMERICAN: 12 mL/min — AB (ref >60)
GLUCOSE: 170 mg/dL — AB (ref 65–99)
Potassium: 3.9 mmol/L (ref 3.5–5.1)
Sodium: 135 mmol/L (ref 135–145)
Total Bilirubin: 1.4 mg/dL — ABNORMAL HIGH (ref 0.3–1.2)
Total Protein: 7.5 g/dL (ref 6.5–8.1)

## 2015-11-28 LAB — HEPARIN LEVEL (UNFRACTIONATED)
HEPARIN UNFRACTIONATED: 0.25 [IU]/mL — AB (ref 0.30–0.70)
HEPARIN UNFRACTIONATED: 0.34 [IU]/mL (ref 0.30–0.70)

## 2015-11-28 LAB — GLUCOSE, CAPILLARY: Glucose-Capillary: 207 mg/dL — ABNORMAL HIGH (ref 65–99)

## 2015-11-28 LAB — MAGNESIUM: MAGNESIUM: 2.1 mg/dL (ref 1.7–2.4)

## 2015-11-28 MED ORDER — CALCIUM ACETATE (PHOS BINDER) 667 MG PO CAPS: 2001.0000 mg | ORAL_CAPSULE | Freq: Three times a day (TID) | ORAL | Status: DC

## 2015-11-28 MED ORDER — PRO-STAT SUGAR FREE PO LIQD
30.0000 mL | Freq: Two times a day (BID) | ORAL | Status: DC
  Administered 2015-11-28 – 2015-12-01 (×7): 30 mL via ORAL
  Filled 2015-11-28 (×7): qty 30

## 2015-11-28 MED ORDER — SEVELAMER CARBONATE 800 MG PO TABS
2400.0000 mg | ORAL_TABLET | Freq: Three times a day (TID) | ORAL | Status: DC
  Administered 2015-11-28 – 2015-12-01 (×8): 2400 mg via ORAL
  Filled 2015-11-28 (×8): qty 3

## 2015-11-28 MED ORDER — ACETAMINOPHEN 325 MG PO TABS
650.0000 mg | ORAL_TABLET | Freq: Once | ORAL | Status: AC
  Administered 2015-11-28: 650 mg via ORAL
  Filled 2015-11-28: qty 2

## 2015-11-28 MED ORDER — RENA-VITE PO TABS
1.0000 | ORAL_TABLET | Freq: Every day | ORAL | Status: DC
Start: 2015-11-28 — End: 2015-12-02
  Administered 2015-11-28 – 2015-12-01 (×4): 1 via ORAL
  Filled 2015-11-28 (×4): qty 1

## 2015-11-28 MED ORDER — HYDROCORTISONE NA SUCCINATE PF 100 MG IJ SOLR
50.0000 mg | Freq: Every day | INTRAMUSCULAR | Status: DC
  Administered 2015-11-29: 50 mg via INTRAVENOUS
  Filled 2015-11-28: qty 2

## 2015-11-28 MED ORDER — LORAZEPAM 1 MG PO TABS
1.0000 mg | ORAL_TABLET | Freq: Two times a day (BID) | ORAL | Status: AC | PRN
  Administered 2015-11-28: 1 mg via ORAL
  Filled 2015-11-28: qty 1

## 2015-11-28 NOTE — Care Management Important Message (Signed)
Important Message  Patient Details  Name: Kathleen [name] MRN: [id number] Date of Birth: [birth year]   Medicare Important Message Given:  Yes    Oralia Rud Trevaun Rendleman 11/28/2015, 12:23 PM

## 2015-11-28 NOTE — Progress Notes (Signed)
[facility] Progress Note  Assessment/Plan: 1. Acute Encephalopathy / Generalized weakness/ ^ ammonia = 47 with Nl lfts/ ?uti no growth in cs afeb/? Narcotics With Oxycodone And ativan on home med list /= admit team WU / per OP [facility] center RN She is Wheelchair bound. Still disoriented.  2. ESRD - Normal schedule TTS [facility]). For HD tomorrow. K+ 3.9 3.0 K bath. Recheck in HD tomorrow.  3. Hypertension/volume -BP stable now. Considering midodrine. HD 11/27/15 Net UF 1129. Post wt 59.7 kg. Is under OP EDW. Will need adjusting when DC'd.  4. Ho CAD / CABG/ with Mildly elavate Troponin 0.43 in setting of Chronic Systolic HF (EF 25-30%) - vol up on cxr uf attempt as bp allows as above 5. Anemia: HGB 10.6 On ESA. noted trans fer sat 25 % BUT Ferritn >7500 no fe on hd  6. Metabolic bone disease - Phos 12.6 On Ca Acetate 2001mg   tid with meals as OP. Ca 9.1 C Ca 10.06 Change to renvela binders. Follow Ca/Phos. Use 2.0 Ca bath.  7. Nutrition: Albumin 2.8. Renal diet, add prostat.   Disposition: spoke with husband in hallway. He wants her to go to [facility] where she can have rehab. He is adamant that he does not want her placed in SNF in [place]. Will ask SW to assist.   Dene Gentry. Brown NP-C 11/28/2015, 10:41 AM  [facility] [phone]  Subjective: "I'm doing all right". Can't tell me why she came to hospital. Knows she is in hospital, disoriented to time and place.  Denies SOB. No C/Os. Pleasant.     Objective Filed Vitals:   11/27/15 2043 11/28/15 0624 11/28/15 0818 11/28/15 0900  BP: 110/66 132/71 126/72   Pulse: 100 95 92   Temp: 98 F (36.7 C) 97.8 F (36.6 C) 97.4 F (36.3 C)   TempSrc: Oral Oral Oral   Resp: 18 18 15    Height:      Weight:      SpO2: 94% 97% 99% 95%   Physical Exam General: Pleasant, NAD Heart: Holosystolic blowing M with slight heave. slight JVD Lungs: bilateral breath sounds CTA Abdomen: soft  nontender Extremities: No LE edema Dialysis Access: LUA AVF + bruit  Dialysis Orders:  Center: [facility] on MWF . EDW 61.0 kg HD Bath 3.0 k/ 2.25ca Time 3.5 hr Heparin none ( per kid center RN "was held in past with hip and pelvic fracture. Nathaneil Canary not restarted" )Access LUA AVF  PO Calcitriol 1.0 mcg/HD / Mircera 50 q 4 weeks hd Last given 11/18/15  Other op labs hgb 10.6 Ca 9.3 phos 8.9 pth 968   Additional Objective Labs: Basic Metabolic Panel:  Recent Labs Lab 11/26/15 0615 11/27/15 0320 11/28/15 0556  NA 136 137 135  K 4.5 5.3* 3.9  CL 97* 99* 97*  CO2 22 20* 21*  GLUCOSE 161* 123* 170*  BUN 37* 52* 30*  CREATININE 4.68* 5.36* 3.60*  CALCIUM 8.5* 8.8* 9.1  PHOS  --  12.6*  --    Liver Function Tests:  Recent Labs Lab 12/13/2015 1813 11/26/15 0615 11/27/15 0320 11/28/15 0556  AST 44* 33  --  27  ALT 20 17  --  17  ALKPHOS 104 79  --  72  BILITOT 1.4* 1.1  --  1.4*  PROT 8.0 6.8  --  7.5  ALBUMIN 2.9* 2.3* 2.4* 2.8*   No results for input(s): LIPASE, AMYLASE in the last 168 hours. CBC:  Recent Labs  Lab 11/02/2015 1813 11/13/2015 2306 11/26/15 0615 11/27/15 0320 11/28/15 0556  WBC 9.0 10.4 8.1 7.4 6.3  NEUTROABS 7.2  --   --   --  5.2  HGB 11.5* 10.3* 9.8* 9.6* 10.6*  HCT 37.1 32.9* 30.7* 30.5* 32.7*  MCV 96.1 94.8 94.2 92.4 95.3  PLT 203 184 190 203 200   Blood Culture    Component Value Date/Time   SDES BLOOD WRIST RIGHT 11/16/2015 1840   SPECREQUEST BOTTLES DRAWN AEROBIC ONLY 8CC 11/01/2015 1840   CULT NO GROWTH 2 DAYS 10/31/2015 1840   REPTSTATUS PENDING 11/08/2015 1840    Cardiac Enzymes:  Recent Labs Lab 11/22/2015 2306 11/26/15 0153 11/26/15 1030 11/26/15 1655 11/27/15 0320  TROPONINI 0.34* 0.43* 0.78* 0.79* 0.57*   CBG:  Recent Labs Lab 11/27/15 0330 11/27/15 0534 11/27/15 1237 11/27/15 1522 11/27/15 2134  GLUCAP 103* 108* 156* 139* 232*   Iron Studies:  Recent Labs  11/26/15 1030  IRON 46  TIBC 188*  FERRITIN  >7500*   @ Studies/Results: No results found. Medications: . heparin 1,000 Units/hr (11/28/15 0840)   . ARIPiprazole  5 mg Oral Daily  . aspirin  81 mg Oral Daily  . calcitRIOL  1 mcg Oral Q T,Th,Sa-HD  . calcium acetate  667 mg Oral TID WC  . darbepoetin (ARANESP) injection - DIALYSIS  100 mcg Intravenous Q Thu-HD  . docusate sodium  100 mg Oral BID  . hydrocortisone sod succinate (SOLU-CORTEF) inj  50 mg Intravenous Q12H  . metoprolol  2.5 mg Intravenous 3 times per day  . mometasone-formoterol  2 puff Inhalation BID  . pantoprazole  40 mg Oral Daily    I have seen and examined this patient and agree with plan as outlined by Alonna Buckler, NP-C.  Unfortunately Mrs. Greenawalt has had progressive failure to thrive at home and is requiring more and more assistance with ADL's and not strong enough to help at all with transfers (to wheelchair or commode) and her husband is no longer able to care for her at home.  SW to help with SNF vs. LTC placement (husband would prefer [facility]).  Will continue with HD while she remains an inpatient. Julien Nordmann Deanda Ruddell,MD 11/28/2015 2:47 PM

## 2015-11-28 NOTE — Care Management Note (Addendum)
Case Management Note  Patient Details  Name: FABIAN ALMENDINGER MRN: [id number] Date of Birth: 1951-01-25  Subjective/Objective:         CM following for progression and d/c planning.           Action/Plan: Referral made for pt to be eval for possible LTAC admission at [facility]. [facility] rep Sue Lush, has reviewed pt record with this CM and per LTAC guidelines this pt does not qualify for admission. Dr Elisabeth Pigeon notified.  2:50pm this CM spoke with pt husband, Tahara Vosters re d/c plans and informed him of [facility] inability to accept this pt. Per husband this pt most likely will need SNF and he prefers to go somewhere where Mrs. Hilligoss will not have to go to dialysis so early in the morning. When she is a home she normally has a chair time around noon. This CM explained to Mr Keibler that the CSW, Erie Noe would work with him on finding a rehab facility for this pt.  Await PT/OT eval.    Expected Discharge Date:                  Expected Discharge Plan:  Skilled Nursing Facility  In-House Referral:  Clinical Social Work  Discharge planning Services  CM Consult  Post Acute Care Choice:    Choice offered to:     DME Arranged:    DME Agency:     HH Arranged:    HH Agency:     Status of Service:  In process, will continue to follow  Medicare Important Message Given:  Yes Date Medicare IM Given:    Medicare IM give by:    Date Additional Medicare IM Given:    Additional Medicare Important Message give by:     If discussed at Long Length of Stay Meetings, dates discussed:    Additional Comments:  Starlyn Skeans, RN 11/28/2015, 2:14 PM

## 2015-11-28 NOTE — Progress Notes (Signed)
ANTICOAGULATION CONSULT NOTE - FOLLOW UP    HL = 0.34 (goal 0.3 - 0.7 units/mL) Heparin dosing weight = 62 kg   Assessment: 64 YOF to continue on IV heparin for NSTEMI.   Heparin level is therapeutic; no bleeding reported.   Plan: - Increase heparin gtt slightly to 1050 units/hr - F/U AM labs    Kathleen [name], PharmD, BCPS 11/28/2015, 5:37 PM

## 2015-11-28 NOTE — Progress Notes (Signed)
ANTICOAGULATION CONSULT NOTE  Pharmacy Consult for heparin Indication: chest pain/ACS  Allergies  Allergen Reactions  . Other Other (See Comments)    Mycins allergy, Resource Breeze feeding supplement causes high blood sugar   . Vancomycin Anaphylaxis    Wheezing and respiratory distress noted when given 11/24/15  . Metformin And Related Diarrhea  . Rosuvastatin Calcium Other (See Comments)    Side effects of leg pain and aching with "cholesterol medications", pt requested that no statins be given to her from this time forward  . Statins Other (See Comments)    Side effects of leg pain and aching with "cholesterol medications", pt requested that no statins be given to her from this time forward  . Carvedilol Nausea Only    D/c'd 05/2015 [facility]  . Latex Rash  . Levaquin [Levofloxacin In D5w] Hives  . Levofloxacin Rash  . Sulfa Antibiotics Hives  . Tobradex [Tobramycin-Dexamethasone] Other (See Comments)    redness    Patient Measurements: Height: 5\' 4"  (162.6 cm) Weight: 131 lb 9.8 oz (59.7 kg) IBW/kg (Calculated) : 54.7 Heparin Dosing Weight: 61.5 kg   Vital Signs: Temp: 97.4 F (36.3 C) (03/31 0818) Temp Source: Oral (03/31 0818) BP: 126/72 mmHg (03/31 0818) Pulse Rate: 92 (03/31 0818)  Labs:  Recent Labs  11/26/15 0615 11/26/15 1030 11/26/15 1655 11/27/15 0320 11/27/15 0325 11/27/15 1623 11/28/15 0556  HGB 9.8*  --   --  9.6*  --   --  10.6*  HCT 30.7*  --   --  30.5*  --   --  32.7*  PLT 190  --   --  203  --   --  200  HEPARINUNFRC  --   --   --   --  0.28* 0.59 0.25*  CREATININE 4.68*  --   --  5.36*  --   --  3.60*  TROPONINI  --  0.78* 0.79* 0.57*  --   --   --     Estimated Creatinine Clearance: 13.3 mL/min (by C-G formula based on Cr of 3.6).  Assessment: 89 yoF, pharmacy consulted to dose heparin for NSTEMI. NO significant EKG changes. HL therapeutic yesterday evening after HD, but dropped below range this morning at 0.25 units/mL. Hgb 10.6,  plts 200- no bleeding noted. Received Aranesp dose yesterday.  Goal of Therapy:  Heparin level 0.3-0.7 units/ml Monitor platelets by anticoagulation protocol: Yes   Plan:  -Increase heparin infusion to 1000units/hr -Check HL level in 8 hours -Daily HL and CBC -Follow for long term plan- appears no plans for cath at this time.  Sheliah Fiorillo D. Analleli Gierke, PharmD, BCPS Clinical Pharmacist Pager: [phone] 11/28/2015 8:37 AM

## 2015-11-28 NOTE — Care Management (Signed)
Noted consult for LTAC and made aware of pt preferrance for [facility]. [facility] rep Sue Lush notified and will review pt record.  Johny Shock RN MPH, case manager, [phone]

## 2015-11-28 NOTE — Progress Notes (Signed)
Patient ID: Kathleen [name], female   DOB: [birth year], 65 y.o.   MRN: [id number] [facility] PROGRESS NOTE  SAMARIYAH COWLES WUJ:[id number] DOB: 07-01-1951 DOA: 12/22/2015 PCP: Hal Morales, NP  Brief narrative:    65 year old female with past medical history of end-stage renal disease on hemodialysis (TTS), COPD, CAD, ischemic cardiomyopathy, chronic systolic CHF, status post CABG, PVD status post stenting who presented to [facility] increasingly weak after dialysis 3 days prior to this admission. Patient apparently had some cough but no shortness of breath. No chest pain and nausea, vomiting or diarrhea. Chest x-ray on admission showed infiltrates and patient was started on empiric antibiotics. She required intermittent BiPAP. She also had episodes of altered mental status. CT scan of the head showed no acute intracranial findings.  Assessment/Plan:    Acute encephalopathy / generalized weakness  - Unclear etiology - EEG, MRI brain and CT head unrevealing other than age related cerebral atrophy  - Will see with CM if able to place in select   Possible UTI - UA abnormal, but UCx showed no growth - All abx stopped at this point   Mildly elevated troponin - Troponin level peaked at 0.79 - Likely demand ischemia from ESRD - Chest pain free - No acute ischemic changes on 12 lead EKG  Chronic systolic CHF last EF measured was 25-30% - Stable resp status - Weight trend: 61.5 kg --> 61.1 kg --> 59.7 kg in past 72 hours  ESRD on hemodialysis on T/Th/Sat / Hyperkalemia  - Appreciate renal following  - Potassium WNL  CAD status post CABG - PVD status post stenting - Stable - Continue aspirin daily   COPD - Stable respiratory status   Anemia in chronic kidney disease  - Stable hemoglobin   Metabolic bone disease  - Continue sevelamer    DVT Prophylaxis  - On heparin IV  Code Status: DNR/DNI Family Communication:  Family not at the bedside this am  Disposition  Plan: to select if able to place, will inform CM, d/c once pt stable probably in next 48-72 hours   IV access:  Peripheral IV  Procedures and diagnostic studies:    Ct Head Wo Contrast 12-22-15  Stable would age related cerebral atrophy, ventriculomegaly and periventricular white matter disease. No acute intracranial findings.   Mr Brain Wo Contrast 11/26/2015  1. No acute intracranial process identified. 2. Generalized cerebral atrophy with mild to moderate chronic small vessel ischemic disease.   Dg Chest Portable 1 View 2015/12/22  Mild-to-moderate congestive heart failure, not appreciably changed. Stable trace bilateral pleural effusions.   Dg Chest Port 1 View 12-22-15   Bilateral interstitial coarsening above prior baseline, edema versus atypical infection.   EEG 11/26/15;abnormal EEG demonstrating a mild diffuse slowing of electrocerebral activity. This can be seen in a wide variety of encephalopathic state including those of a toxic, metabolic, or degenerative nature-negative seizure activity.  Medical Consultants:  Nephrology  Other Consultants:  PT  IAnti-Infectives:   Vanc 3/28>> 3/29  Zosyn 3/28>> 3/29    Manson Passey, MD  [facility] Pager [phone]  Time spent in minutes: 25 minutes  If 7PM-7AM, please contact night-coverage [email or web address] Password [facility] 11/28/2015, 11:34 AM   LOS: 3 days    HPI/Subjective: No acute overnight events. Not responding to verbal stimuli.   Objective: Filed Vitals:   11/27/15 2043 11/28/15 0624 11/28/15 0818 11/28/15 0900  BP: 110/66 132/71 126/72   Pulse: 100 95 92   Temp: 98 F (36.7  C) 97.8 F (36.6 C) 97.4 F (36.3 C)   TempSrc: Oral Oral Oral   Resp: Height:      Weight:      SpO2: 94% 97% 99% 95%    Intake/Output Summary (Last 24 hours) at 11/28/15 1134 Last data filed at 11/28/15 1030  Gross per 24 hour  Intake  502.5 ml  Output      3 ml  Net  499.5 ml    Exam:   General:  Pt is  alert, not in acute distress  Cardiovascular: Regular rate and rhythm, S1/S2 appreciated   Respiratory: Clear to auscultation bilaterally, no wheezing, no crackles, no rhonchi  Abdomen: Soft, non tender, non distended, bowel sounds present  Extremities: No edema, pulses palpable bilaterally  Neuro: Grossly nonfocal  Data Reviewed: Basic Metabolic Panel:  Recent Labs Lab 11/17/2015 1753 11/15/2015 1813 11/26/2015 2306 11/26/15 0615 11/27/15 0320 11/28/15 0556  NA 133* 135  --  136 137 135  K 4.8 5.1  --  4.5 5.3* 3.9  CL 97* 95*  --  97* 99* 97*  CO2  --  19*  --  22 20* 21*  GLUCOSE 124* 123*  --  161* 123* 170*  BUN 33* 30*  --  37* 52* 30*  CREATININE 4.10* 4.18* 4.33* 4.68* 5.36* 3.60*  CALCIUM  --  8.8*  --  8.5* 8.8* 9.1  MG  --   --   --   --   --  2.1  PHOS  --   --   --   --  12.6*  --    Liver Function Tests:  Recent Labs Lab 11/15/2015 1813 11/26/15 0615 11/27/15 0320 11/28/15 0556  AST 44* 33  --  27  ALT 20 17  --  17  ALKPHOS 104 79  --  72  BILITOT 1.4* 1.1  --  1.4*  PROT 8.0 6.8  --  7.5  ALBUMIN 2.9* 2.3* 2.4* 2.8*   No results for input(s): LIPASE, AMYLASE in the last 168 hours.  Recent Labs Lab 11/11/2015 1813 11/27/15 0320  AMMONIA 47* 22   CBC:  Recent Labs Lab 11/06/2015 1813 11/06/2015 2306 11/26/15 0615 11/27/15 0320 11/28/15 0556  WBC 9.0 10.4 8.1 7.4 6.3  NEUTROABS 7.2  --   --   --  5.2  HGB 11.5* 10.3* 9.8* 9.6* 10.6*  HCT 37.1 32.9* 30.7* 30.5* 32.7*  MCV 96.1 94.8 94.2 92.4 95.3  PLT 203 184 190 203 200   Cardiac Enzymes:  Recent Labs Lab 11/05/2015 2306 11/26/15 0153 11/26/15 1030 11/26/15 1655 11/27/15 0320  TROPONINI 0.34* 0.43* 0.78* 0.79* 0.57*   BNP: Invalid input(s): POCBNP CBG:  Recent Labs Lab 11/27/15 0330 11/27/15 0534 11/27/15 1237 11/27/15 1522 11/27/15 2134  GLUCAP 103* 108* 156* 139* 232*    Recent Results (from the past 240 hour(s))  Urine culture     Status: None   Collection Time:  11/21/2015  5:24 PM  Result Value Ref Range Status   Specimen Description URINE, CATHETERIZED  Final   Special Requests NONE  Final   Culture NO GROWTH 2 DAYS  Final   Report Status 11/27/2015 FINAL  Final  Blood Culture (routine x 2)     Status: None (Preliminary result)   Collection Time: 11/07/2015  5:41 PM  Result Value Ref Range Status   Specimen Description BLOOD RIGHT HAND  Final   Special Requests BOTTLES DRAWN AEROBIC AND ANAEROBIC 5CC  Final  Culture NO GROWTH 3 DAYS  Final   Report Status PENDING  Incomplete  Blood Culture (routine x 2)     Status: None (Preliminary result)   Collection Time: December 01, 2015  6:40 PM  Result Value Ref Range Status   Specimen Description BLOOD WRIST RIGHT  Final   Special Requests BOTTLES DRAWN AEROBIC ONLY 8CC  Final   Culture NO GROWTH 3 DAYS  Final   Report Status PENDING  Incomplete  MRSA PCR Screening     Status: None   Collection Time: 11/26/15  1:38 AM  Result Value Ref Range Status   MRSA by PCR NEGATIVE NEGATIVE Final    Comment:        The GeneXpert MRSA Assay (FDA approved for NASAL specimens only), is one component of a comprehensive MRSA colonization surveillance program. It is not intended to diagnose MRSA infection nor to guide or monitor treatment for MRSA infections.      Scheduled Meds: . ARIPiprazole  5 mg Oral Daily  . aspirin  81 mg Oral Daily  . calcitRIOL  1 mcg Oral Q T,Th,Sa-HD  . darbepoetin (ARANESP) injection - DIALYSIS  100 mcg Intravenous Q Thu-HD  . docusate sodium  100 mg Oral BID  . feeding supplement (PRO-STAT SUGAR FREE 64)  30 mL Oral BID  . hydrocortisone sod succinate (SOLU-CORTEF) inj  50 mg Intravenous Q12H  . metoprolol  2.5 mg Intravenous 3 times per day  . mometasone-formoterol  2 puff Inhalation BID  . multivitamin  1 tablet Oral QHS  . pantoprazole  40 mg Oral Daily  . sevelamer carbonate  2,400 mg Oral TID WC   Continuous Infusions: . heparin 1,000 Units/hr (11/28/15 0840)

## 2015-11-28 NOTE — Progress Notes (Signed)
Pt with complaints of generalized pain throughout secondary to her arthritis. Page to [facility] Billings for notification as pt has no orders for pain medication at this time. New order per Benedetto Coons for tylenol 650 mg po once. Dondra Spry, RN

## 2015-11-29 DIAGNOSIS — Z992 Dependence on renal dialysis: Secondary | ICD-10-CM

## 2015-11-29 DIAGNOSIS — J9601 Acute respiratory failure with hypoxia: Secondary | ICD-10-CM

## 2015-11-29 DIAGNOSIS — N186 End stage renal disease: Secondary | ICD-10-CM

## 2015-11-29 LAB — COMPREHENSIVE METABOLIC PANEL
ALT: 17 U/L (ref 14–54)
AST: 27 U/L (ref 15–41)
Albumin: 2.9 g/dL — ABNORMAL LOW (ref 3.5–5.0)
Alkaline Phosphatase: 70 U/L (ref 38–126)
Anion gap: 21 — ABNORMAL HIGH (ref 5–15)
BUN: 49 mg/dL — ABNORMAL HIGH (ref 6–20)
CHLORIDE: 96 mmol/L — AB (ref 101–111)
CO2: 19 mmol/L — ABNORMAL LOW (ref 22–32)
CREATININE: 4.89 mg/dL — AB (ref 0.44–1.00)
Calcium: 9.6 mg/dL (ref 8.9–10.3)
GFR, EST AFRICAN AMERICAN: 10 mL/min — AB (ref >60)
GFR, EST NON AFRICAN AMERICAN: 8 mL/min — AB (ref >60)
Glucose, Bld: 138 mg/dL — ABNORMAL HIGH (ref 65–99)
POTASSIUM: 3.9 mmol/L (ref 3.5–5.1)
Sodium: 136 mmol/L (ref 135–145)
Total Bilirubin: 1.7 mg/dL — ABNORMAL HIGH (ref 0.3–1.2)
Total Protein: 7.9 g/dL (ref 6.5–8.1)

## 2015-11-29 LAB — CBC WITH DIFFERENTIAL/PLATELET
Basophils Absolute: 0 10*3/uL (ref 0.0–0.1)
Basophils Relative: 0 %
EOS ABS: 0 10*3/uL (ref 0.0–0.7)
Eosinophils Relative: 1 %
HCT: 35.5 % — ABNORMAL LOW (ref 36.0–46.0)
HEMOGLOBIN: 11.1 g/dL — AB (ref 12.0–15.0)
LYMPHS ABS: 1.4 10*3/uL (ref 0.7–4.0)
LYMPHS PCT: 17 %
MCH: 29.4 pg (ref 26.0–34.0)
MCHC: 31.3 g/dL (ref 30.0–36.0)
MCV: 94.2 fL (ref 78.0–100.0)
Monocytes Absolute: 0.6 10*3/uL (ref 0.1–1.0)
Monocytes Relative: 7 %
NEUTROS PCT: 75 %
Neutro Abs: 6.3 10*3/uL (ref 1.7–7.7)
Platelets: 271 10*3/uL (ref 150–400)
RBC: 3.77 MIL/uL — AB (ref 3.87–5.11)
RDW: 16.8 % — ABNORMAL HIGH (ref 11.5–15.5)
WBC: 8.4 10*3/uL (ref 4.0–10.5)

## 2015-11-29 LAB — HEPARIN LEVEL (UNFRACTIONATED): HEPARIN UNFRACTIONATED: 0.2 [IU]/mL — AB (ref 0.30–0.70)

## 2015-11-29 LAB — GLUCOSE, CAPILLARY
GLUCOSE-CAPILLARY: 84 mg/dL (ref 65–99)
Glucose-Capillary: 119 mg/dL — ABNORMAL HIGH (ref 65–99)
Glucose-Capillary: 290 mg/dL — ABNORMAL HIGH (ref 65–99)

## 2015-11-29 LAB — MAGNESIUM: Magnesium: 2.3 mg/dL (ref 1.7–2.4)

## 2015-11-29 MED ORDER — HEPARIN SODIUM (PORCINE) 1000 UNIT/ML DIALYSIS: 1000.0000 [IU] | INTRAMUSCULAR | Status: DC | PRN

## 2015-11-29 MED ORDER — SODIUM CHLORIDE 0.9 % IV SOLN: 100.0000 mL | INTRAVENOUS | Status: DC | PRN

## 2015-11-29 MED ORDER — LIDOCAINE-PRILOCAINE 2.5-2.5 % EX CREA: 1.0000 "application " | TOPICAL_CREAM | CUTANEOUS | Status: DC | PRN

## 2015-11-29 MED ORDER — ACETAMINOPHEN 325 MG PO TABS
650.0000 mg | ORAL_TABLET | Freq: Four times a day (QID) | ORAL | Status: DC | PRN
  Administered 2015-11-29 – 2015-11-30 (×2): 650 mg via ORAL
  Filled 2015-11-29 (×2): qty 2

## 2015-11-29 MED ORDER — ALTEPLASE 2 MG IJ SOLR
2.0000 mg | Freq: Once | INTRAMUSCULAR | Status: DC | PRN
Start: 2015-11-29 — End: 2015-11-29

## 2015-11-29 MED ORDER — LIDOCAINE HCL (PF) 1 % IJ SOLN: 5.0000 mL | INTRAMUSCULAR | Status: DC | PRN

## 2015-11-29 MED ORDER — LORAZEPAM 0.5 MG PO TABS
0.5000 mg | ORAL_TABLET | Freq: Two times a day (BID) | ORAL | Status: DC | PRN
  Administered 2015-11-29 – 2015-11-30 (×3): 0.5 mg via ORAL
  Filled 2015-11-29 (×3): qty 1

## 2015-11-29 MED ORDER — PENTAFLUOROPROP-TETRAFLUOROETH EX AERO: 1.0000 "application " | INHALATION_SPRAY | CUTANEOUS | Status: DC | PRN

## 2015-11-29 NOTE — Progress Notes (Signed)
PT Cancellation Note  Patient Details Name: GLORI GREELY MRN: [id number] DOB: Jul 13, 1951   Cancelled Treatment:    Reason Eval/Treat Not Completed: Medical issues which prohibited therapy (Decreased BP and O2 sats).  Patient just returned from HD with decreased BP and O2 sats.  Will defer evaluation today and attempt again in morning. Thank you,   Olivia Canter 11/29/2015, 2:00 PM

## 2015-11-29 NOTE — Progress Notes (Signed)
Pt bp back up to 119/72 hr 106.  Paged Dr. Layla Maw

## 2015-11-29 NOTE — Progress Notes (Signed)
ANTICOAGULATION CONSULT NOTE  Pharmacy Consult for heparin Indication: chest pain/ACS  Allergies  Allergen Reactions  . Other Other (See Comments)    Mycins allergy, Resource [employer]reeze feeding supplement causes high blood sugar   . Vancomycin Anaphylaxis    Wheezing and respiratory distress noted when given 11/24/15  . Metformin And Related Diarrhea  . Rosuvastatin Calcium Other (See Comments)    Side effects of leg pain and aching with "cholesterol medications", pt requested that no statins be given to her from this time forward  . Statins Other (See Comments)    Side effects of leg pain and aching with "cholesterol medications", pt requested that no statins be given to her from this time forward  . Carvedilol Nausea Only    D/c'd 05/2015 [facility]  . Latex Rash  . Levaquin [Levofloxacin In D5w] Hives  . Levofloxacin Rash  . Sulfa Antibiotics Hives  . Tobradex [Tobramycin-Dexamethasone] Other (See Comments)    redness    Patient Measurements: Height: 5\' 4"  (162.6 cm) Weight: 130 lb 4.7 oz (59.1 kg) (bedscale) IBW/kg (Calculated) : 54.7 Heparin Dosing Weight: 61.5 kg   Vital Signs: Temp: 98.3 F (36.8 C) (04/01 1642) Temp Source: Oral (04/01 1642) BP: 130/71 mmHg (04/01 1642) Pulse Rate: 107 (04/01 1642)  Labs:  Recent Labs  11/27/15 0320  11/28/15 0556 11/28/15 1641 11/29/15 0735 11/29/15 0738  HGB 9.6*  --  10.6*  --   --  11.1*  HCT 30.5*  --  32.7*  --   --  35.5*  PLT 203  --  200  --   --  271  HEPARINUNFRC  --   < > 0.25* 0.34 0.20*  --   CREATININE 5.36*  --  3.60*  --   --  4.89*  TROPONINI 0.57*  --   --   --   --   --   < > = values in this interval not displayed.  Estimated Creatinine Clearance: 9.8 mL/min (by C-G formula based on Cr of 4.89).  Assessment: 89 yoF continues on IV heparin. Pt lost IV access earlier today so heparin was off. Heparin level was low this morning. IV access re-established and heparin restarted this afternoon.  Goal of  Therapy:  Heparin level 0.3-0.7 units/ml Monitor platelets by anticoagulation protocol: Yes   Plan:  - Increase heparin gtt to 1150 units/hr - Check an 8 hour heparin level - Daily heparin level and CBC - F/u LOT of heparin if no cath plans  Lysle Pearl, PharmD, BCPS Pager # [phone] 11/29/2015 6:20 PM

## 2015-11-29 NOTE — Progress Notes (Signed)
Pt IV out.  Paged IV team.  Pt on heparin drip @ 10.  Do not lay flat.

## 2015-11-29 NOTE — Procedures (Signed)
Patient was seen on dialysis and the procedure was supervised. BFR 400 Via LUE AVF BP is 110/76.  Patient appears to be delirious and mumbling to herself during treatment.  She remains somnolent and confused.

## 2015-11-29 NOTE — Progress Notes (Addendum)
Patient ID: Kathleen [name], female   DOB: 1951-03-29, 65 y.o.   MRN: [id number] [facility] PROGRESS NOTE  Kathleen [name] [id number] DOB: July 12, 1951 DOA: 11/20/2015 PCP: Hal Morales, NP  Brief narrative:    65 year old female with past medical history of end-stage renal disease on hemodialysis (TTS), COPD, CAD, ischemic cardiomyopathy, chronic systolic CHF, status post CABG, PVD status post stenting who presented to [facility] increasingly weak after dialysis 3 days prior to this admission. Patient apparently had some cough but no shortness of breath. No chest pain and nausea, vomiting or diarrhea. Chest x-ray on admission showed infiltrates and patient was started on empiric antibiotics. She required intermittent BiPAP. She also had episodes of altered mental status. CT scan of the head showed no acute intracranial findings.  Assessment/Plan:    Acute encephalopathy / generalized weakness  - Unclear etiology - EEG, MRI brain and CT head unrevealing other than age related cerebral atrophy   Possible UTI - UA abnormal, but UCx showed no growth - Completed abx at this point  Mildly elevated troponin - Troponin level peaked at 0.79 - Likely demand ischemia from ESRD - No acute ischemic changes on 12 lead EKG  Chronic systolic CHF last EF measured was 25-30% - Stable resp status - Weight trend: 61.5 kg --> 61.1 kg --> 59.7 kg  ESRD on hemodialysis on T/Th/Sat / Hyperkalemia  - Appreciate renal following  - Potassium WNL  CAD status post CABG - PVD status post stenting - Stable - Continue aspirin daily   COPD - Stable respiratory status   Anemia in chronic kidney disease  - Stable hemoglobin at 11.1  Metabolic bone disease  - Continue sevelamer   DVT Prophylaxis  - Continue IV heparin  Code Status: DNR/DNI Family Communication:  Spoke with her husband over the phone  Disposition Plan: unable to predict at this point, hopefully early next week,  SNF  IV access:  Peripheral IV  Procedures and diagnostic studies:    Ct Head Wo Contrast 11/18/2015  Stable would age related cerebral atrophy, ventriculomegaly and periventricular white matter disease. No acute intracranial findings.   Mr Brain Wo Contrast 11/26/2015  1. No acute intracranial process identified. 2. Generalized cerebral atrophy with mild to moderate chronic small vessel ischemic disease.   Dg Chest Portable 1 View 11/07/2015  Mild-to-moderate congestive heart failure, not appreciably changed. Stable trace bilateral pleural effusions.   Dg Chest Port 1 View 11/27/2015   Bilateral interstitial coarsening above prior baseline, edema versus atypical infection.   EEG 11/26/15;abnormal EEG demonstrating a mild diffuse slowing of electrocerebral activity. This can be seen in a wide variety of encephalopathic state including those of a toxic, metabolic, or degenerative nature-negative seizure activity.  Medical Consultants:  Nephrology  Other Consultants:  PT  IAnti-Infectives:   Vanco 3/28>> 3/29  Zosyn 3/28>> 3/29    Manson Passey, MD  [facility] Pager [phone]  Time spent in minutes: 15 minutes  If 7PM-7AM, please contact night-coverage [email or web address] Password [email or web address] 11/29/2015, 2:12 PM   LOS: 4 days    HPI/Subjective: No acute overnight events. No resp distress.  Objective: Filed Vitals:   11/29/15 1030 11/29/15 1100 11/29/15 1202 11/29/15 1400  BP: 120/82 133/79 127/69 119/72  Pulse: 110 105 110 106  Temp:  96 F (35.6 C) 98 F (36.7 C)   TempSrc:  Oral Oral   Resp: 22 23 19    Height:      Weight:  59.1 kg (130 lb 4.7  oz)    SpO2:  97% 98% 100%    Intake/Output Summary (Last 24 hours) at 11/29/15 1412 Last data filed at 11/29/15 1100  Gross per 24 hour  Intake    540 ml  Output   1018 ml  Net   -478 ml    Exam:   General:  Pt is not in distress  Cardiovascular: RRR, S1/S2 (+)  Respiratory: No wheezing, no crackles, no  rhonchi  Abdomen: (+) BS, non tender  Extremities: No swelling, palpable pulses   Neuro: Nonfocal  Data Reviewed: Basic Metabolic Panel:  Recent Labs Lab 12-21-15 1813 December 21, 2015 2306 11/26/15 0615 11/27/15 0320 11/28/15 0556 11/29/15 0738  NA 135  --  136 137 135 136  K 5.1  --  4.5 5.3* 3.9 3.9  CL 95*  --  97* 99* 97* 96*  CO2 19*  --  22 20* 21* 19*  GLUCOSE 123*  --  161* 123* 170* 138*  BUN 30*  --  37* 52* 30* 49*  CREATININE 4.18* 4.33* 4.68* 5.36* 3.60* 4.89*  CALCIUM 8.8*  --  8.5* 8.8* 9.1 9.6  MG  --   --   --   --  2.1 2.3  PHOS  --   --   --  12.6*  --   --    Liver Function Tests:  Recent Labs Lab 2015-12-21 1813 11/26/15 0615 11/27/15 0320 11/28/15 0556 11/29/15 0738  AST 44* 33  --  27 27  ALT 20 17  --  17 17  ALKPHOS 104 79  --  72 70  BILITOT 1.4* 1.1  --  1.4* 1.7*  PROT 8.0 6.8  --  7.5 7.9  ALBUMIN 2.9* 2.3* 2.4* 2.8* 2.9*   No results for input(s): LIPASE, AMYLASE in the last 168 hours.  Recent Labs Lab Dec 21, 2015 1813 11/27/15 0320  AMMONIA 47* 22   CBC:  Recent Labs Lab 12-21-15 1813 12-21-2015 2306 11/26/15 0615 11/27/15 0320 11/28/15 0556 11/29/15 0738  WBC 9.0 10.4 8.1 7.4 6.3 8.4  NEUTROABS 7.2  --   --   --  5.2 6.3  HGB 11.5* 10.3* 9.8* 9.6* 10.6* 11.1*  HCT 37.1 32.9* 30.7* 30.5* 32.7* 35.5*  MCV 96.1 94.8 94.2 92.4 95.3 94.2  PLT 203 184 190 203 200 271   Cardiac Enzymes:  Recent Labs Lab 12/21/15 2306 11/26/15 0153 11/26/15 1030 11/26/15 1655 11/27/15 0320  TROPONINI 0.34* 0.43* 0.78* 0.79* 0.57*   BNP: Invalid input(s): POCBNP CBG:  Recent Labs Lab 11/27/15 1522 11/27/15 2134 11/28/15 1526 11/29/15 0736 11/29/15 0937  GLUCAP 139* 232* 207* 119* 84    Recent Results (from the past 240 hour(s))  Urine culture     Status: None   Collection Time: 2015-12-21  5:24 PM  Result Value Ref Range Status   Specimen Description URINE, CATHETERIZED  Final   Special Requests NONE  Final   Culture NO  GROWTH 2 DAYS  Final   Report Status 11/27/2015 FINAL  Final  Blood Culture (routine x 2)     Status: None (Preliminary result)   Collection Time: 2015/12/21  5:41 PM  Result Value Ref Range Status   Specimen Description BLOOD RIGHT HAND  Final   Special Requests BOTTLES DRAWN AEROBIC AND ANAEROBIC 5CC  Final   Culture NO GROWTH 4 DAYS  Final   Report Status PENDING  Incomplete  Blood Culture (routine x 2)     Status: None (Preliminary result)   Collection Time: 2015-12-21  6:40 PM  Result Value Ref Range Status   Specimen Description BLOOD WRIST RIGHT  Final   Special Requests BOTTLES DRAWN AEROBIC ONLY 8CC  Final   Culture NO GROWTH 4 DAYS  Final   Report Status PENDING  Incomplete  MRSA PCR Screening     Status: None   Collection Time: 11/26/15  1:38 AM  Result Value Ref Range Status   MRSA by PCR NEGATIVE NEGATIVE Final    Comment:        The GeneXpert MRSA Assay (FDA approved for NASAL specimens only), is one component of a comprehensive MRSA colonization surveillance program. It is not intended to diagnose MRSA infection nor to guide or monitor treatment for MRSA infections.      Scheduled Meds: . ARIPiprazole  5 mg Oral Daily  . aspirin  81 mg Oral Daily  . calcitRIOL  1 mcg Oral Q T,Th,Sa-HD  . darbepoetin (ARANESP) injection - DIALYSIS  100 mcg Intravenous Q Thu-HD  . docusate sodium  100 mg Oral BID  . feeding supplement (PRO-STAT SUGAR FREE 64)  30 mL Oral BID  . hydrocortisone sod succinate (SOLU-CORTEF) inj  50 mg Intravenous Daily  . mometasone-formoterol  2 puff Inhalation BID  . multivitamin  1 tablet Oral QHS  . pantoprazole  40 mg Oral Daily  . sevelamer carbonate  2,400 mg Oral TID WC   Continuous Infusions: . heparin 1,000 Units/hr (11/29/15 0912)

## 2015-11-29 NOTE — Progress Notes (Signed)
Paged Rapid Response pts systollic bp 48/ re-check 78.  O2 82 % o 4 lpm via [place].

## 2015-11-29 NOTE — Progress Notes (Signed)
Paged Dr. Elisabeth Pigeon.  Pts husband in room and would like to speak about options for discharge.  Also pt would like something for her "nerves and some tylenol.

## 2015-11-29 DEATH — deceased

## 2015-11-30 DIAGNOSIS — I5023 Acute on chronic systolic (congestive) heart failure: Secondary | ICD-10-CM

## 2015-11-30 DIAGNOSIS — N390 Urinary tract infection, site not specified: Secondary | ICD-10-CM

## 2015-11-30 LAB — COMPREHENSIVE METABOLIC PANEL
ALK PHOS: 70 U/L (ref 38–126)
ALT: 18 U/L (ref 14–54)
AST: 29 U/L (ref 15–41)
Albumin: 2.9 g/dL — ABNORMAL LOW (ref 3.5–5.0)
Anion gap: 17 — ABNORMAL HIGH (ref 5–15)
BILIRUBIN TOTAL: 1.3 mg/dL — AB (ref 0.3–1.2)
BUN: 25 mg/dL — ABNORMAL HIGH (ref 6–20)
CALCIUM: 9.5 mg/dL (ref 8.9–10.3)
CO2: 22 mmol/L (ref 22–32)
CREATININE: 3.17 mg/dL — AB (ref 0.44–1.00)
Chloride: 96 mmol/L — ABNORMAL LOW (ref 101–111)
GFR, EST AFRICAN AMERICAN: 16 mL/min — AB (ref >60)
GFR, EST NON AFRICAN AMERICAN: 14 mL/min — AB (ref >60)
Glucose, Bld: 225 mg/dL — ABNORMAL HIGH (ref 65–99)
Potassium: 4.2 mmol/L (ref 3.5–5.1)
Sodium: 135 mmol/L (ref 135–145)
TOTAL PROTEIN: 7.6 g/dL (ref 6.5–8.1)

## 2015-11-30 LAB — CBC WITH DIFFERENTIAL/PLATELET
BASOS PCT: 0 %
Basophils Absolute: 0 10*3/uL (ref 0.0–0.1)
EOS ABS: 0 10*3/uL (ref 0.0–0.7)
Eosinophils Relative: 0 %
HCT: 37 % (ref 36.0–46.0)
HEMOGLOBIN: 11.6 g/dL — AB (ref 12.0–15.0)
Lymphocytes Relative: 17 %
Lymphs Abs: 1.8 10*3/uL (ref 0.7–4.0)
MCH: 30.1 pg (ref 26.0–34.0)
MCHC: 31.4 g/dL (ref 30.0–36.0)
MCV: 96.1 fL (ref 78.0–100.0)
Monocytes Absolute: 1 10*3/uL (ref 0.1–1.0)
Monocytes Relative: 9 %
NEUTROS PCT: 74 %
Neutro Abs: 7.8 10*3/uL — ABNORMAL HIGH (ref 1.7–7.7)
PLATELETS: 236 10*3/uL (ref 150–400)
RBC: 3.85 MIL/uL — ABNORMAL LOW (ref 3.87–5.11)
RDW: 17.2 % — ABNORMAL HIGH (ref 11.5–15.5)
WBC: 10.6 10*3/uL — AB (ref 4.0–10.5)

## 2015-11-30 LAB — HEPARIN LEVEL (UNFRACTIONATED): HEPARIN UNFRACTIONATED: 0.45 [IU]/mL (ref 0.30–0.70)

## 2015-11-30 LAB — CULTURE, BLOOD (ROUTINE X 2)
Culture: NO GROWTH
Culture: NO GROWTH

## 2015-11-30 LAB — TSH: TSH: 2.252 u[IU]/mL (ref 0.350–4.500)

## 2015-11-30 LAB — T4, FREE: FREE T4: 0.91 ng/dL (ref 0.61–1.12)

## 2015-11-30 LAB — MAGNESIUM: MAGNESIUM: 2.1 mg/dL (ref 1.7–2.4)

## 2015-11-30 LAB — GLUCOSE, CAPILLARY: GLUCOSE-CAPILLARY: 163 mg/dL — AB (ref 65–99)

## 2015-11-30 MED ORDER — HEPARIN SODIUM (PORCINE) 5000 UNIT/ML IJ SOLN
5000.0000 [IU] | Freq: Three times a day (TID) | INTRAMUSCULAR | Status: DC
  Administered 2015-11-30 – 2015-12-02 (×5): 5000 [IU] via SUBCUTANEOUS
  Filled 2015-11-30 (×4): qty 1

## 2015-11-30 NOTE — NC FL2 (Signed)
MEDICAID FL2 LEVEL OF CARE SCREENING TOOL     IDENTIFICATION  Patient Name: Kathleen [name] Birthdate: [birth year] Sex: female Admission Date (Current Location): 11/04/2015  [facility] and [place] Number:  Producer, television/film/video and Address:  [facility] [place], [address], [place], [place] [address]      Provider Number: [id number]  Attending Physician Name and Address:  Vassie Loll, MD  Relative Name and Phone Number:       Current Level of Care: Hospital Recommended Level of Care: Skilled Nursing Facility Prior Approval Number:    Date Approved/Denied:   PASRR Number: [id number]  Discharge Plan: SNF    Current Diagnoses: Patient Active Problem List   Diagnosis Date Noted  . Elevated troponin   . Chronic systolic CHF (congestive heart failure) (HCC)   . Lactic acidosis   . End-stage renal disease on hemodialysis (HCC)   . Chronic obstructive pulmonary disease (HCC)   . Anemia in chronic kidney disease   . Severe sepsis (HCC) 11/08/2015  . Acute respiratory failure (HCC) 11/02/2015  . COPD (chronic obstructive pulmonary disease) (HCC) 11/14/2015  . Acute encephalopathy 11/11/2015  . FTT (failure to thrive) in adult   . Biliary colic   . Coronary artery disease involving coronary bypass graft of native heart with other forms of angina pectoris (HCC)   . Acute calculous cholecystitis 05/06/2015  . Acute on chronic systolic CHF (congestive heart failure) (HCC) 03/06/2015  . Bilateral leg edema   . Diabetes mellitus without complication (HCC) 03/04/2015  . SOB (shortness of breath)   . Pulmonary edema 01/22/2014  . NSTEMI (non-ST elevated myocardial infarction) (HCC) 01/01/2014  . Sacrum and coccyx fracture (HCC) 01/01/2014  . Altered mental state 01/01/2014  . Chronic systolic heart failure (HCC) 01/01/2014  . Fracture of proximal end of femur (HCC) 12/10/2013  . Palliative care encounter 11/15/2013  . Generalized pain 11/15/2013  .  Acute on chronic combined systolic and diastolic heart failure, NYHA class 4 (HCC) 11/14/2013  . Severe mitral regurgitation 11/14/2013  . Acute respiratory failure with hypoxia (HCC) 11/11/2013  . Acute pulmonary edema (HCC) 11/11/2013  . ESRD on dialysis (HCC) 11/11/2013  . Obstructive chronic bronchitis without exacerbation (HCC) 11/11/2013  . Diabetes (HCC) 11/11/2013  . Cardiomyopathy, ischemic 11/11/2013  . Anemia of chronic disease 11/11/2013  . CCF (congestive cardiac failure) (HCC) 10/11/2013  . Closed fracture of part of neck of femur (HCC) 10/07/2013  . End stage kidney disease (HCC) 10/07/2013    Orientation RESPIRATION BLADDER Height & Weight     Self  Normal Continent Weight: 129 lb 10.1 oz (58.8 kg) Height:   (162.6 cm)  BEHAVIORAL SYMPTOMS/MOOD NEUROLOGICAL BOWEL NUTRITION STATUS      Continent Diet (Renal)  AMBULATORY STATUS COMMUNICATION OF NEEDS Skin   Total Care Verbally Normal                       Personal Care Assistance Level of Assistance  Total care       Total Care Assistance: Maximum assistance   Functional Limitations Info             SPECIAL CARE FACTORS FREQUENCY  PT (By licensed PT), OT (By licensed OT)     PT Frequency: 5x weekly OT Frequency: 5x weekly            Contractures      Additional Factors Info  Code Status Code Status Info: DNR/DNI  Current Medications (11/30/2015):  This is the current hospital active medication list Current Facility-Administered Medications  Medication Dose Route Frequency Provider Last Rate Last Dose  . acetaminophen (TYLENOL) tablet 650 mg  650 mg Oral Q6H PRN Dorothea Ogle, MD   650 mg at 11/29/15 1240  . ARIPiprazole (ABILIFY) tablet 5 mg  5 mg Oral Daily Eduard Clos, MD   5 mg at 11/30/15 1120  . aspirin chewable tablet 81 mg  81 mg Oral Daily Lonia Blood, MD   81 mg at 11/30/15 1120  . calcitRIOL (ROCALTROL) capsule 1 mcg  1 mcg Oral Q T,Th,Sa-HD  Eduard Clos, MD   1 mcg at 11/29/15 1310  . Darbepoetin Alfa (ARANESP) injection 100 mcg  100 mcg Intravenous Q Thu-HD Lenny Pastel, PA-C   100 mcg at 11/27/15 1030  . docusate sodium (COLACE) capsule 100 mg  100 mg Oral BID Eduard Clos, MD   100 mg at 11/30/15 1120  . feeding supplement (PRO-STAT SUGAR FREE 64) liquid 30 mL  30 mL Oral BID Pola Corn, NP   30 mL at 11/30/15 1000  . heparin injection 5,000 Units  5,000 Units Subcutaneous 3 times per day Vassie Loll, MD   5,000 Units at 11/30/15 1500  . LORazepam (ATIVAN) tablet 0.5 mg  0.5 mg Oral BID PRN Dorothea Ogle, MD   0.5 mg at 11/30/15 0400  . mometasone-formoterol (DULERA) 200-5 MCG/ACT inhaler 2 puff  2 puff Inhalation BID Eduard Clos, MD   2 puff at 11/30/15 0746  . multivitamin (RENA-VIT) tablet 1 tablet  1 tablet Oral QHS Pola Corn, NP   1 tablet at 11/30/15 0038  . nitroGLYCERIN (NITROSTAT) SL tablet 0.4 mg  0.4 mg Sublingual Q5 min PRN Eduard Clos, MD      . pantoprazole (PROTONIX) EC tablet 40 mg  40 mg Oral Daily Eduard Clos, MD   40 mg at 11/30/15 1120  . sevelamer carbonate (RENVELA) tablet 2,400 mg  2,400 mg Oral TID WC Pola Corn, NP   2,400 mg at 11/30/15 1122     Discharge Medications: Please see discharge summary for a list of discharge medications.  Relevant Imaging Results:  Relevant Lab Results:   Additional Information Patient has dialysis in [place] at 11:30am on Tues Thur and Sat.   [facility]  [phone]

## 2015-11-30 NOTE — Progress Notes (Signed)
Patient ID: Kathleen [name], female   DOB: [birth year], 65 y.o.   MRN: [id number] [facility] PROGRESS NOTE  Kathleen [name] BJY:[id number] DOB: 22-Jan-1951 DOA: 2015/12/05 PCP: Hal Morales, NP  Brief narrative:    65 year old female with past medical history of end-stage renal disease on hemodialysis (TTS), COPD, CAD, ischemic cardiomyopathy, chronic systolic CHF, status post CABG, PVD status post stenting who presented to [facility] increasingly weak after dialysis 3 days prior to this admission. Patient apparently had some cough but no shortness of breath. No chest pain and nausea, vomiting or diarrhea. Chest x-ray on admission showed infiltrates and patient was started on empiric antibiotics. She required intermittent BiPAP. She also had episodes of altered mental status. CT scan of the head showed no acute intracranial findings.  Assessment/Plan:    Acute encephalopathy / generalized weakness  - Unclear etiology - EEG, MRI brain and CT head unrevealing other than age related cerebral atrophy  -RPR, HIV and B12 WNL -ammonia level is normal now -will check thyroid profile -continue supportive care -presumed UTI treated; which could have contributing initially to alter mentation   Possible UTI - UA abnormal, but UCx showed no growth - Has Completed abx at this point -no dysuria and no fever  Mildly elevated troponin - Troponin level peaked at 0.79 - Likely demand ischemia in patient with CAD and ESRD; due to CHF exacerbation - No acute ischemic changes on 12 lead EKG and no CP  Acute on Chronic systolic CHF last EF measured was 25-30% -some crackles at her bases and requiring O2 supplementation -plan is to dry patient a little bit more with next HD -will follow renal rec's -continue daily weight and strict intake/output   ESRD on hemodialysis on T/Th/Sat / Hyperkalemia  - Appreciate renal following  - Potassium WNL now  CAD status post CABG - PVD status post  stenting - Stable and no complaining of CP - Continue aspirin daily   COPD -no wheezing currently -continue PRN nebulizer  Anemia in chronic kidney disease  - Stable hemoglobin at 11.1 -iron and epogen as per renal discretion; not needed currently.  Metabolic bone disease  - Continue sevelamer   DVT Prophylaxis  - Continue IV heparin  Code Status: DNR/DNI Family Communication:  Spoke with her husband over the phone  Disposition Plan: unable to predict at this point, hopefully early next week, SNF at discharge anticipated.  IV access:  Peripheral IV  Procedures and diagnostic studies:    Ct Head Wo Contrast December 05, 2015  Stable would age related cerebral atrophy, ventriculomegaly and periventricular white matter disease. No acute intracranial findings.   Mr Brain Wo Contrast 11/26/2015  1. No acute intracranial process identified. 2. Generalized cerebral atrophy with mild to moderate chronic small vessel ischemic disease.   Dg Chest Portable 1 View 12-05-2015  Mild-to-moderate congestive heart failure, not appreciably changed. Stable trace bilateral pleural effusions.   Dg Chest Port 1 View Dec 05, 2015   Bilateral interstitial coarsening above prior baseline, edema versus atypical infection.   EEG 11/26/15;abnormal EEG demonstrating a mild diffuse slowing of electrocerebral activity. This can be seen in a wide variety of encephalopathic state including those of a toxic, metabolic, or degenerative nature-negative seizure activity.  Medical Consultants:  Nephrology  Other Consultants:  PT  IAnti-Infectives:   Vanco 3/28>> 3/29  Zosyn 3/28>> 3/29    Vassie Loll, MD  [facility] Pager [phone]  Time spent in minutes: 15 minutes  If 7PM-7AM, please contact night-coverage [email or web address]  Password [email or web address] 11/30/2015, 10:02 PM   LOS: 5 days    HPI/Subjective: Patient is afebrile, confused and weak. Requiring oxygen supplementation and with some crackles at her bases  on exam.  Objective: Filed Vitals:   11/30/15 0801 11/30/15 1552 11/30/15 1948 11/30/15 2100  BP: 128/73 115/65  117/52  Pulse: 104 113  113  Temp: 97.5 F (36.4 C) 98 F (36.7 C)  97.9 F (36.6 C)  TempSrc: Oral Oral  Oral  Resp: 20 19  20   Height:      Weight:      SpO2: 100% 100% 99% 100%    Intake/Output Summary (Last 24 hours) at 11/30/15 2202 Last data filed at 11/30/15 1832  Gross per 24 hour  Intake    630 ml  Output      0 ml  Net    630 ml    Exam:   General:  Pt is slow to respond, oriented X 2, intermittently; afebrile currently and denying CP. Still confused, weak and with poor insight  Cardiovascular: tahycardic, S1/S2, (+) SM, no rubs  Respiratory: No wheezing, positive bibasilar crackles, no rhonchi  Abdomen: (+) BS, non tender  Extremities:trace edema, palpable pulses   Neuro: Nonfocal  Data Reviewed: Basic Metabolic Panel:  Recent Labs Lab 11/26/15 0615 11/27/15 0320 11/28/15 0556 11/29/15 0738 11/30/15 0308  NA 136 137 135 136 135  K 4.5 5.3* 3.9 3.9 4.2  CL 97* 99* 97* 96* 96*  CO2 22 20* 21* 19* 22  GLUCOSE 161* 123* 170* 138* 225*  BUN 37* 52* 30* 49* 25*  CREATININE 4.68* 5.36* 3.60* 4.89* 3.17*  CALCIUM 8.5* 8.8* 9.1 9.6 9.5  MG  --   --  2.1 2.3 2.1  PHOS  --  12.6*  --   --   --    Liver Function Tests:  Recent Labs Lab 11/06/2015 1813 11/26/15 0615 11/27/15 0320 11/28/15 0556 11/29/15 0738 11/30/15 0308  AST 44* 33  --  27 27 29   ALT 20 17  --  17 17 18   ALKPHOS 104 79  --  72 70 70  BILITOT 1.4* 1.1  --  1.4* 1.7* 1.3*  PROT 8.0 6.8  --  7.5 7.9 7.6  ALBUMIN 2.9* 2.3* 2.4* 2.8* 2.9* 2.9*    Recent Labs Lab 11/28/2015 1813 11/27/15 0320  AMMONIA 47* 22   CBC:  Recent Labs Lab 11/20/2015 1813  11/26/15 0615 11/27/15 0320 11/28/15 0556 11/29/15 0738 11/30/15 0308  WBC 9.0  < > 8.1 7.4 6.3 8.4 10.6*  NEUTROABS 7.2  --   --   --  5.2 6.3 7.8*  HGB 11.5*  < > 9.8* 9.6* 10.6* 11.1* 11.6*  HCT 37.1  < >  30.7* 30.5* 32.7* 35.5* 37.0  MCV 96.1  < > 94.2 92.4 95.3 94.2 96.1  PLT 203  < > 190 203 200 271 236  < > = values in this interval not displayed. Cardiac Enzymes:  Recent Labs Lab 11/06/2015 2306 11/26/15 0153 11/26/15 1030 11/26/15 1655 11/27/15 0320  TROPONINI 0.34* 0.43* 0.78* 0.79* 0.57*   CBG:  Recent Labs Lab 11/28/15 1526 11/29/15 0736 11/29/15 0937 11/29/15 2016 11/30/15 0809  GLUCAP 207* 119* 84 290* 163*    Recent Results (from the past 240 hour(s))  Urine culture     Status: None   Collection Time: 11/04/2015  5:24 PM  Result Value Ref Range Status   Specimen Description URINE, CATHETERIZED  Final   Special Requests NONE  Final   Culture NO GROWTH 2 DAYS  Final   Report Status 11/27/2015 FINAL  Final  Blood Culture (routine x 2)     Status: None   Collection Time: 2015/11/27  5:41 PM  Result Value Ref Range Status   Specimen Description BLOOD RIGHT HAND  Final   Special Requests BOTTLES DRAWN AEROBIC AND ANAEROBIC 5CC  Final   Culture NO GROWTH 5 DAYS  Final   Report Status 11/30/2015 FINAL  Final  Blood Culture (routine x 2)     Status: None   Collection Time: Nov 27, 2015  6:40 PM  Result Value Ref Range Status   Specimen Description BLOOD WRIST RIGHT  Final   Special Requests BOTTLES DRAWN AEROBIC ONLY 8CC  Final   Culture NO GROWTH 5 DAYS  Final   Report Status 11/30/2015 FINAL  Final  MRSA PCR Screening     Status: None   Collection Time: 11/26/15  1:38 AM  Result Value Ref Range Status   MRSA by PCR NEGATIVE NEGATIVE Final    Comment:        The GeneXpert MRSA Assay (FDA approved for NASAL specimens only), is one component of a comprehensive MRSA colonization surveillance program. It is not intended to diagnose MRSA infection nor to guide or monitor treatment for MRSA infections.      Scheduled Meds: . ARIPiprazole  5 mg Oral Daily  . aspirin  81 mg Oral Daily  . calcitRIOL  1 mcg Oral Q T,Th,Sa-HD  . darbepoetin (ARANESP) injection -  DIALYSIS  100 mcg Intravenous Q Thu-HD  . docusate sodium  100 mg Oral BID  . feeding supplement (PRO-STAT SUGAR FREE 64)  30 mL Oral BID  . heparin subcutaneous  5,000 Units Subcutaneous 3 times per day  . mometasone-formoterol  2 puff Inhalation BID  . multivitamin  1 tablet Oral QHS  . pantoprazole  40 mg Oral Daily  . sevelamer carbonate  2,400 mg Oral TID WC   Continuous Infusions:

## 2015-11-30 NOTE — Progress Notes (Signed)
RT went in to see patient to give MDI. Pt appears confused. She is shallow breathing and HR is about 115 bpm. RT consulted with RN about contacting the doctor, RN states the patient was in this same condition yesterday when the doctors made their rounds and that they are aware of her status. Pt is a DNR.

## 2015-11-30 NOTE — Progress Notes (Addendum)
RN went to assess pt after shift change report. Pt responds to voice. Pt cold and clammy. ST on monitor. VS obtained and stable. CBG taken. MD contacted and will come assess the pt shortly. Will continue to assess and monitor pt.

## 2015-11-30 NOTE — Evaluation (Signed)
Physical Therapy Evaluation Patient Details Name: Kathleen [name] MRN: [id number] DOB: Jan 24, 1951 Today's Date: 11/30/2015   History of Present Illness  Patient is a 65 yo female admitted 12-25-15 with weakness and confusion.  Patient with acute encephalopathy, now on 4.5 l/min O2 due to resp issues.   PMH:  ESRD on HD, COPD, CAD, ICM, CHF, CABG, PVD    Clinical Impression  Patient presents with problems listed below.  Will benefit from acute PT to maximize functional mobility prior to discharge.  Patient requiring max to total assist for mobility, decreased cognition, and lethargy.  Recommend SNF at discharge for continued therapy.    Follow Up Recommendations SNF;Supervision/Assistance - 24 hour    Equipment Recommendations  Wheelchair (measurements PT);Wheelchair cushion (measurements PT)    Recommendations for Other Services       Precautions / Restrictions Precautions Precautions: Fall Restrictions Weight Bearing Restrictions: No      Mobility  Bed Mobility Overal bed mobility: Needs Assistance Bed Mobility: Rolling;Sidelying to Sit;Sit to Sidelying Rolling: Max assist Sidelying to sit: Total assist     Sit to sidelying: Total assist General bed mobility comments: Verbal and tactile cues for mobility.  Hand-over-hand assist to reach for bedrail to assist with rolling.  Patient did not hold onto rail.  Max assist to roll to side using bed pads.  Patient unable to initiate sitting - total assist to move trunk to upright position.  Once upright, required max assist to maintain balance - posterior lean.  Required total assist to return to sidelying > supine and to scoot to [facility].  Transfers                 General transfer comment: Unable  Ambulation/Gait                Stairs            Wheelchair Mobility    Modified Rankin (Stroke Patients Only)       Balance Overall balance assessment: Needs assistance Sitting-balance support: Bilateral upper  extremity supported;Feet supported Sitting balance-Leahy Scale: Zero Sitting balance - Comments: Max assist to maintain balance Postural control: Posterior lean                                   Pertinent Vitals/Pain Pain Assessment: Faces Faces Pain Scale: Hurts little more Pain Location: Generalized  Pain Descriptors / Indicators: Grimacing Pain Intervention(s): Monitored during session;Repositioned    Home Living Family/patient expects to be discharged to:: Skilled nursing facility Living Arrangements: Spouse/significant other                    Prior Function Level of Independence:  (Unsure - patient unable to provide information)               Hand Dominance   Dominant Hand: Right    Extremity/Trunk Assessment   Upper Extremity Assessment: Generalized weakness (Strength grossly 3-/5)           Lower Extremity Assessment: Generalized weakness;Difficult to assess due to impaired cognition (Unable to raise LE's against gravity to command)         Communication   Communication: No difficulties (Answers with 1-word responses)  Cognition Arousal/Alertness: Lethargic Behavior During Therapy: Flat affect Overall Cognitive Status: No family/caregiver present to determine baseline cognitive functioning (Patient states she is in [place])  General Comments      Exercises        Assessment/Plan    PT Assessment Patient needs continued PT services  PT Diagnosis Generalized weakness;Difficulty walking;Altered mental status   PT Problem List Decreased strength;Decreased activity tolerance;Decreased balance;Decreased mobility;Decreased cognition;Decreased knowledge of use of DME;Cardiopulmonary status limiting activity;Pain  PT Treatment Interventions Functional mobility training;Therapeutic activities;Therapeutic exercise;Balance training;Cognitive remediation;Patient/family education   PT Goals (Current  goals can be found in the Care Plan section) Acute Rehab PT Goals Patient Stated Goal: Unable to state PT Goal Formulation: Patient unable to participate in goal setting Time For Goal Achievement: 12/14/15 Potential to Achieve Goals: Fair    Frequency Min 2X/week   Barriers to discharge        Co-evaluation               End of Session Equipment Utilized During Treatment: Oxygen Activity Tolerance: Patient limited by lethargy Patient left: in bed;with call bell/phone within reach;with bed alarm set Nurse Communication: Mobility status         Time: 7340-3709 PT Time Calculation (min) (ACUTE ONLY): 10 min   Charges:   PT Evaluation $PT Eval Moderate Complexity: 1 Procedure     PT G CodesVena Austria 12-27-2015, 10:20 AM Durenda Hurt. Renaldo Fiddler, [facility] Acute Rehab Services Pager [phone]

## 2015-11-30 NOTE — Progress Notes (Signed)
Patient ID: Kathleen [name], female   DOB: 06/09/1951, 65 y.o.   MRN: [id number]  [facility] Progress Note    Subjective:   Still confused and weak, no complaints   Objective:   BP 128/73 mmHg  Pulse 104  Temp(Src) 97.5 F (36.4 C) (Oral)  Resp 20  Ht 5\' 4"  (1.626 m)  Wt 58.8 kg (129 lb 10.1 oz)  BMI 22.24 kg/m2  SpO2 100%  Intake/Output: I/O last 3 completed shifts: In: 150 [P.O.:150] Out: 1018 [Other:1018]   Intake/Output this shift:  Total I/O In: 50 [P.O.:50] Out: -  Weight change: -2.1 kg (-4 lb 10.1 oz)  Physical Exam: ZSW:FUXNA, ill-appearing, slowed mentation TFT:DDUKG Resp:bilateral rales URK:YHCWCB Ext:tr edema, LUE AVF +T/B  Labs: BMET  Recent Labs Lab 2015/12/16 1753 16-Dec-2015 1813 2015/12/16 2306 11/26/15 0615 11/27/15 0320 11/28/15 0556 11/29/15 0738 11/30/15 0308  NA 133* 135  --  136 137 135 136 135  K 4.8 5.1  --  4.5 5.3* 3.9 3.9 4.2  CL 97* 95*  --  97* 99* 97* 96* 96*  CO2  --  19*  --  22 20* 21* 19* 22  GLUCOSE 124* 123*  --  161* 123* 170* 138* 225*  BUN 33* 30*  --  37* 52* 30* 49* 25*  CREATININE 4.10* 4.18* 4.33* 4.68* 5.36* 3.60* 4.89* 3.17*  ALBUMIN  --  2.9*  --  2.3* 2.4* 2.8* 2.9* 2.9*  CALCIUM  --  8.8*  --  8.5* 8.8* 9.1 9.6 9.5  PHOS  --   --   --   --  12.6*  --   --   --    CBC  Recent Labs Lab 2015/12/16 1813  11/27/15 0320 11/28/15 0556 11/29/15 0738 11/30/15 0308  WBC 9.0  < > 7.4 6.3 8.4 10.6*  NEUTROABS 7.2  --   --  5.2 6.3 7.8*  HGB 11.5*  < > 9.6* 10.6* 11.1* 11.6*  HCT 37.1  < > 30.5* 32.7* 35.5* 37.0  MCV 96.1  < > 92.4 95.3 94.2 96.1  PLT 203  < > 203 200 271 236  < > = values in this interval not displayed.  @IMGRELPRIORS @ Medications:    . ARIPiprazole  5 mg Oral Daily  . aspirin  81 mg Oral Daily  . calcitRIOL  1 mcg Oral Q T,Th,Sa-HD  . darbepoetin (ARANESP) injection - DIALYSIS  100 mcg Intravenous Q Thu-HD  . docusate sodium  100 mg Oral BID  . feeding supplement (PRO-STAT  SUGAR FREE 64)  30 mL Oral BID  . heparin subcutaneous  5,000 Units Subcutaneous 3 times per day  . mometasone-formoterol  2 puff Inhalation BID  . multivitamin  1 tablet Oral QHS  . pantoprazole  40 mg Oral Daily  . sevelamer carbonate  2,400 mg Oral TID WC   Dialysis Orders: Center: [facility] on MWF . EDW 61.0 kg HD Bath 3.0 k/ 2.25ca Time 3.5 hr Heparin none ( per kid center RN "was held in past with hip and pelvic fracture. Nathaneil Canary not restarted" )Access LUA AVF  PO Calcitriol 1.0 mcg/HD / Mircera 50 q 4 weeks hd Last given 11/18/15  Assessment/ Plan:   1. Acute Encephalopathy / Generalized weakness/ ^ ammonia = 47 with Nl lfts/ ?uti no growth in cs afeb/? Narcotics With Oxycodone And ativan on home med list /= admit team WU / per OP [facility] center RN She is Wheelchair bound. Still disoriented.  2. ESRD - Normal schedule  TTS [facility]). For HD tomorrow. K+ 3.9 3.0 K bath. Recheck in HD tomorrow.  3. Hypertension/volume -BP stable now. Considering midodrine. HD 11/27/15 Net UF 1129.  1. Bilateral rales and CXR c/w volume overload, plan for extra treatment tomorrow to help with UF. 4. Ho CAD / CABG/ with Mildly elavate Troponin 0.43 in setting of Chronic Systolic HF (EF 25-30%) - vol up on cxr uf attempt as bp allows as above 5. Anemia: HGB 10.6 On ESA. noted trans fer sat 25 % BUT Ferritn >7500 no fe on hd  6. Metabolic bone disease - Phos 12.6 On Ca Acetate  tid with meals as OP. Ca 9.1 C Ca 10.06 Change to renvela binders. Follow Ca/Phos. Use 2.0 Ca bath.  7. Nutrition: Albumin 2.8. Renal diet, add prostat.  Irena Cords, MD [facility], [facility] Pager [phone] 11/30/2015, 10:38 AM

## 2015-11-30 NOTE — Progress Notes (Signed)
ANTICOAGULATION CONSULT NOTE  Pharmacy Consult for heparin Indication: chest pain/ACS  Allergies  Allergen Reactions  . Other Other (See Comments)    Mycins allergy, Resource Breeze feeding supplement causes high blood sugar   . Vancomycin Anaphylaxis    Wheezing and respiratory distress noted when given 11/24/15  . Metformin And Related Diarrhea  . Rosuvastatin Calcium Other (See Comments)    Side effects of leg pain and aching with "cholesterol medications", pt requested that no statins be given to her from this time forward  . Statins Other (See Comments)    Side effects of leg pain and aching with "cholesterol medications", pt requested that no statins be given to her from this time forward  . Carvedilol Nausea Only    D/c'd 05/2015 [facility]  . Latex Rash  . Levaquin [Levofloxacin In D5w] Hives  . Levofloxacin Rash  . Sulfa Antibiotics Hives  . Tobradex [Tobramycin-Dexamethasone] Other (See Comments)    redness    Patient Measurements: Height: 5\' 4"  (162.6 cm) Weight: 130 lb 4.7 oz (59.1 kg) (bedscale) IBW/kg (Calculated) : 54.7 Heparin Dosing Weight: 61.5 kg   Vital Signs: Temp: 97.8 F (36.6 C) (04/01 2151) Temp Source: Oral (04/01 2151) BP: 129/72 mmHg (04/01 2151) Pulse Rate: 104 (04/01 2151)  Labs:  Recent Labs  11/28/15 0556 11/28/15 1641 11/29/15 0735 11/29/15 0738 11/30/15 0308  HGB 10.6*  --   --  11.1* 11.6*  HCT 32.7*  --   --  35.5* 37.0  PLT 200  --   --  271 236  HEPARINUNFRC 0.25* 0.34 0.20*  --  0.45  CREATININE 3.60*  --   --  4.89* 3.17*    Estimated Creatinine Clearance: 15.1 mL/min (by C-G formula based on Cr of 3.17).  Assessment: 65 y.o. female with elevated cardiac markers for heparin  Goal of Therapy:  Heparin level 0.3-0.7 units/ml Monitor platelets by anticoagulation protocol: Yes   Plan:  Continue Heparin at current rate  F/U plan for anticoagulation  Geannie Risen, PharmD, BCPS   11/30/2015 3:55 AM

## 2015-11-30 NOTE — Clinical Social Work Note (Signed)
Clinical Social Work Assessment  Patient Details  Name: Kathleen [name] MRN: [id number] Date of Birth: Jul 16, 1951  Date of referral:  11/30/15               Reason for consult:  Facility Placement                Permission sought to share information with:  Family Supports Permission granted to share information::  No  Name::     Shandreka Dante  Agency::     Relationship::  husband  Contact Information:  [phone]  Housing/Transportation Living arrangements for the past 2 months:  Single Family Home Source of Information:  Spouse Patient Interpreter Needed:  None Criminal Activity/Legal Involvement Pertinent to Current Situation/Hospitalization:  No - Comment as needed Significant Relationships:  Spouse Lives with:  Spouse Do you feel safe going back to the place where you live?  No Need for family participation in patient care:  Yes (Comment) (Patient not able to make decisions; husband is available)  Care giving concerns:  CSW spoke to husband who is very concerned about the patient's current prognosis and health status. He states that he is concerned that it is not clear what is wrong with her and that he is concerned that she is unable to do "anything" and needs dialysis. He expected that she would have been able to go to [facility] and is reluctant for her to go to SNF but is willing based on medical clearance for SNF placement and facility that can provide her with appropriate care, appropriate rehab and transportation to dialysis in [place].    Social Worker assessment / plan:  Patient will be faxed to facilities in [place] and [place]. Husband states he is not familiar with these facilities but is willing to consider facilities that will be able to met patient's needs.   Employment status:  Disabled (Comment on whether or not currently receiving Disability) Insurance information:  Medicare, Managed Care PT Recommendations:  [place] / Referral  to community resources:  [place]  Patient/Family's Response to care:  Husband is very concerned about ensuring that patient gets the appropriate level of care for rehab whenever she steps down to that level of care.   Patient/Family's Understanding of and Emotional Response to Diagnosis, Current Treatment, and Prognosis:  Prognosis is not clear at this point. Awaiting further assessment.   Emotional Assessment Appearance:  Appears stated age Attitude/Demeanor/Rapport:   (disoriented) Affect (typically observed):  Pleasant Orientation:  Fluctuating Orientation (Suspected and/or reported Sundowners) Alcohol / Substance use:  Not Applicable Psych involvement (Current and /or in the community):  Outpatient Provider  Discharge Needs  Concerns to be addressed:  Care Coordination (with dyalysis) Readmission within the last 30 days:  No Current discharge risk:  Cognitively Impaired Barriers to Discharge:  Continued Medical Work up   Christene Lye, LCSW 11/30/2015, 4:19 PM

## 2015-12-01 DIAGNOSIS — I5022 Chronic systolic (congestive) heart failure: Secondary | ICD-10-CM

## 2015-12-01 LAB — RENAL FUNCTION PANEL
ALBUMIN: 2.9 g/dL — AB (ref 3.5–5.0)
ANION GAP: 21 — AB (ref 5–15)
BUN: 54 mg/dL — ABNORMAL HIGH (ref 6–20)
CALCIUM: 10 mg/dL (ref 8.9–10.3)
CO2: 19 mmol/L — ABNORMAL LOW (ref 22–32)
Chloride: 95 mmol/L — ABNORMAL LOW (ref 101–111)
Creatinine, Ser: 4.95 mg/dL — ABNORMAL HIGH (ref 0.44–1.00)
GFR, EST AFRICAN AMERICAN: 10 mL/min — AB (ref >60)
GFR, EST NON AFRICAN AMERICAN: 8 mL/min — AB (ref >60)
GLUCOSE: 190 mg/dL — AB (ref 65–99)
PHOSPHORUS: 7.4 mg/dL — AB (ref 2.5–4.6)
POTASSIUM: 4.3 mmol/L (ref 3.5–5.1)
SODIUM: 135 mmol/L (ref 135–145)

## 2015-12-01 LAB — CBC
HCT: 34.5 % — ABNORMAL LOW (ref 36.0–46.0)
HEMOGLOBIN: 11.3 g/dL — AB (ref 12.0–15.0)
MCH: 31.6 pg (ref 26.0–34.0)
MCHC: 32.8 g/dL (ref 30.0–36.0)
MCV: 96.4 fL (ref 78.0–100.0)
Platelets: 294 10*3/uL (ref 150–400)
RBC: 3.58 MIL/uL — AB (ref 3.87–5.11)
RDW: 17.5 % — ABNORMAL HIGH (ref 11.5–15.5)
WBC: 9.6 10*3/uL (ref 4.0–10.5)

## 2015-12-01 LAB — MAGNESIUM: Magnesium: 2.2 mg/dL (ref 1.7–2.4)

## 2015-12-01 LAB — T3, FREE: T3, Free: 1.9 pg/mL — ABNORMAL LOW (ref 2.0–4.4)

## 2015-12-01 MED ORDER — HEPARIN SODIUM (PORCINE) 1000 UNIT/ML DIALYSIS: 20.0000 [IU]/kg | INTRAMUSCULAR | Status: DC | PRN

## 2015-12-01 NOTE — Consult Note (Addendum)
   [facility] CM Inpatient Consult   12/01/2015  Kathleen [name] 22-Jul-1951 [id number]     Patient screened for potential [facility] Care Management services. Chart reviewed. Noted discharge plan is for SNF. [facility] Care Management services not appropriate at this time. If patient's post hospital needs change please place a [facility] Care Management consult. Spoke with inpatient RNCM to confirm disposition plans. Made aware that palliative consult is now pending.      Raiford Noble, MSN-Ed, RN,BSN [facility] Liaison [phone]

## 2015-12-01 NOTE — Progress Notes (Signed)
Patient ID: Kathleen [name], female   DOB: [birth year], 65 y.o.   MRN: [id number]  [facility] Progress Note    Subjective:   Still confused and weak, no complaints   Objective:   BP 125/64 mmHg  Pulse 110  Temp(Src) 96.9 F (36.1 C) (Oral)  Resp 22  Ht 5\' 4"  (1.626 m)  Wt 59.7 kg (131 lb 9.8 oz)  BMI 22.58 kg/m2  SpO2 100%  Intake/Output: I/O last 3 completed shifts: In: 630 [P.O.:630] Out: 0    Intake/Output this shift:    Weight change: 0.6 kg (1 lb 5.2 oz)  Physical Exam: Gen: extremely frail, ill-appearing, slowed mentation, confused, "please help me" KGU:RKYHC no RG Resp: mostly clear bilat WCB:JSEGBT Ext: no edema, bilat purplish foot changes L> R, LUE AVF +T/B  Labs: BMET  Recent Labs Lab 11/01/2015 1813 11/22/2015 2306 11/26/15 0615 11/27/15 0320 11/28/15 0556 11/29/15 0738 11/30/15 0308 12/01/15 0722  NA 135  --  136 137 135 136 135 135  K 5.1  --  4.5 5.3* 3.9 3.9 4.2 4.3  CL 95*  --  97* 99* 97* 96* 96* 95*  CO2 19*  --  22 20* 21* 19* 22 19*  GLUCOSE 123*  --  161* 123* 170* 138* 225* 190*  BUN 30*  --  37* 52* 30* 49* 25* 54*  CREATININE 4.18* 4.33* 4.68* 5.36* 3.60* 4.89* 3.17* 4.95*  ALBUMIN 2.9*  --  2.3* 2.4* 2.8* 2.9* 2.9* 2.9*  CALCIUM 8.8*  --  8.5* 8.8* 9.1 9.6 9.5 10.0  PHOS  --   --   --  12.6*  --   --   --  7.4*   CBC  Recent Labs Lab 10/31/2015 1813  11/28/15 0556 11/29/15 0738 11/30/15 0308 12/01/15 0722  WBC 9.0  < > 6.3 8.4 10.6* 9.6  NEUTROABS 7.2  --  5.2 6.3 7.8*  --   HGB 11.5*  < > 10.6* 11.1* 11.6* 11.3*  HCT 37.1  < > 32.7* 35.5* 37.0 34.5*  MCV 96.1  < > 95.3 94.2 96.1 96.4  PLT 203  < > 200 271 236 294  < > = values in this interval not displayed.  @IMGRELPRIORS @ Medications:    . ARIPiprazole  5 mg Oral Daily  . aspirin  81 mg Oral Daily  . calcitRIOL  1 mcg Oral Q T,Th,Sa-HD  . darbepoetin (ARANESP) injection - DIALYSIS  100 mcg Intravenous Q Thu-HD  . docusate sodium  100 mg Oral BID  .  feeding supplement (PRO-STAT SUGAR FREE 64)  30 mL Oral BID  . heparin subcutaneous  5,000 Units Subcutaneous 3 times per day  . mometasone-formoterol  2 puff Inhalation BID  . multivitamin  1 tablet Oral QHS  . pantoprazole  40 mg Oral Daily  . sevelamer carbonate  2,400 mg Oral TID WC   Dialysis Orders: Center: [facility] on MWF . EDW 61.0 kg HD Bath 3.0 k/ 2.25ca Time 3.5 hr Heparin none ( per [facility] center RN "was held in past with hip and pelvic fracture. Nathaneil Canary not restarted" )Access LUA AVF  PO Calcitriol 1.0 mcg/HD / Mircera 50 q 4 weeks hd Last given 11/18/15  Assessment/ Plan:   1. AMS/ gen'd weakness - not much better.  No obvious cause. Was on imipramine/ aricept/ ativan/ percocet/ neurontin at home. Here getting Abilify w prn ativan and no narcotics. No fever/ normal WBC. NH3 up somewhat. Very frail with FTT, advanced heart failure and ESRD. Poor  prognosis, not sure baseline QOL.  Will get palliative care involved. Not sure she will be able to dialyze as OP in this condition 2. ESRD TTS , extra today 3. Volume - has mild IS edema admit cxr, under dry wt 2kg 4. Anemia: HGB 10.6 On ESA. noted trans fer sat 25 % BUT Ferritn >7500 no fe on hd 5. Metabolic bone disease - Phos 12.6 On Ca Acetate  tid with meals as OP. Ca 9.1 C Ca 10.06 Change to renvela binders. Follow Ca/Phos. Use 2.0 Ca bath 6. Nutrition: Albumin 2.8. Renal diet, add prostat.  Vinson Moselle MD [employer] pager [phone]    cell [phone] 12/01/2015, 10:03 AM

## 2015-12-01 NOTE — Progress Notes (Addendum)
Patient ID: Kathleen [name], female   DOB: 20-Oct-1950, 65 y.o.   MRN: [id number] [facility] PROGRESS NOTE  Kathleen [name] WUJ:[id number] DOB: [birth year] DOA: 12-11-2015 PCP: Hal Morales, NP  Brief narrative:    65 year old female with past medical history of end-stage renal disease on hemodialysis (TTS), COPD, CAD, ischemic cardiomyopathy, chronic systolic CHF, status post CABG, PVD status post stenting who presented to [facility] increasingly weak after dialysis 3 days prior to this admission. Patient apparently had some cough but no shortness of breath. No chest pain and nausea, vomiting or diarrhea. Chest x-ray on admission showed infiltrates and patient was started on empiric antibiotics. She required intermittent BiPAP. She also had episodes of altered mental status. CT scan of the head showed no acute intracranial findings.  Assessment/Plan:    Acute encephalopathy / generalized weakness  - Unclear etiology - EEG, MRI brain and CT head unrevealing other than age related cerebral atrophy  - RPR, HIV and B12 WNL - Ammonia WNL - Will get palliative care on board to address goals of care  Possible UTI / Leukocytosis  - UA abnormal, but UCx showed no growth - Has completed abx at this point  Mildly elevated troponin - Troponin level peaked at 0.79 - Likely demand ischemia in patient with CAD and ESRD, CHF exacerbation - No acute ischemic changes on 12 lead EKG   Acute on Chronic systolic CHF last EF measured was 25-30% - Continue daily weight and intake and output - Palliative care consulted for goals of care  ESRD on hemodialysis on T/Th/Sat / Hyperkalemia  - Appreciate renal following  - Potassium now WNL  CAD status post CABG - PVD status post stenting - Stable  - Continue aspirin daily   COPD - Continue Dulera - Stable   Anemia in chronic kidney disease  - Stable hemoglobin at 11.3 - Management per renal   Metabolic bone disease  - Continue  sevelamer   DVT Prophylaxis  - SubQ heparin   Code Status: DNR/DNI Family Communication:  Spoke with her husband over the phone 4/1 Disposition Plan: unable to predict at this point, hopefully by the end of the week, SNF at discharge anticipated.  IV access:  Peripheral IV  Procedures and diagnostic studies:    Ct Head Wo Contrast 11-Dec-2015  Stable would age related cerebral atrophy, ventriculomegaly and periventricular white matter disease. No acute intracranial findings.   Mr Brain Wo Contrast 11/26/2015  1. No acute intracranial process identified. 2. Generalized cerebral atrophy with mild to moderate chronic small vessel ischemic disease.   Dg Chest Portable 1 View 12-11-15  Mild-to-moderate congestive heart failure, not appreciably changed. Stable trace bilateral pleural effusions.   Dg Chest Port 1 View 2015-12-11   Bilateral interstitial coarsening above prior baseline, edema versus atypical infection.   EEG 11/26/15;abnormal EEG demonstrating a mild diffuse slowing of electrocerebral activity. This can be seen in a wide variety of encephalopathic state including those of a toxic, metabolic, or degenerative nature-negative seizure activity.  Medical Consultants:  Nephrology Palliative care   Other Consultants:  PT  IAnti-Infectives:   Vanco 3/28>> 3/29  Zosyn 3/28>> 3/29    Manson Passey, MD  [facility] Pager [phone]  Time spent in minutes: 25 minutes  If 7PM-7AM, please contact night-coverage [email or web address] Password [facility] 12/01/2015, 3:55 PM   LOS: 6 days    HPI/Subjective: No overnight events.   Objective: Filed Vitals:   12/01/15 0900 12/01/15 0930 12/01/15 0954 12/01/15 1100  BP: 136/69 125/64 133/71 130/68  Pulse: 106 110 106 107  Temp:   97.9 F (36.6 C) 97 F (36.1 C)  TempSrc:   Oral Oral  Resp:   17 18  Height:      Weight:   56.1 kg (123 lb 10.9 oz)   SpO2:   98% 100%    Intake/Output Summary (Last 24 hours) at 12/01/15  1555 Last data filed at 12/01/15 1300  Gross per 24 hour  Intake    480 ml  Output   2523 ml  Net  -2043 ml    Exam:   General:  Sleeping, no acute distress   Cardiovascular: tahycardic, S1/S2 appreciated   Respiratory: No wheezing, basilar crackles   Abdomen: non tender, non distended, (+)BS  Extremities:LE edema, palpable pulses   Neuro: No focal neurologic deficits   Data Reviewed: Basic Metabolic Panel:  Recent Labs Lab 11/27/15 0320 11/28/15 0556 11/29/15 0738 11/30/15 0308 12/01/15 0722 12/01/15 1242  NA 137 135 136 135 135  --   K 5.3* 3.9 3.9 4.2 4.3  --   CL 99* 97* 96* 96* 95*  --   CO2 20* 21* 19* 22 19*  --   GLUCOSE 123* 170* 138* 225* 190*  --   BUN 52* 30* 49* 25* 54*  --   CREATININE 5.36* 3.60* 4.89* 3.17* 4.95*  --   CALCIUM 8.8* 9.1 9.6 9.5 10.0  --   MG  --  2.1 2.3 2.1  --  2.2  PHOS 12.6*  --   --   --  7.4*  --    Liver Function Tests:  Recent Labs Lab December 19, 2015 1813 11/26/15 0615 11/27/15 0320 11/28/15 0556 11/29/15 0738 11/30/15 0308 12/01/15 0722  AST 44* 33  --  --   ALT 20 17  --  --   ALKPHOS 104 79  --  72 70 70  --   BILITOT 1.4* 1.1  --  1.4* 1.7* 1.3*  --   PROT 8.0 6.8  --  7.5 7.9 7.6  --   ALBUMIN 2.9* 2.3* 2.4* 2.8* 2.9* 2.9* 2.9*    Recent Labs Lab Dec 19, 2015 1813 11/27/15 0320  AMMONIA 47* 22   CBC:  Recent Labs Lab December 19, 2015 1813  11/27/15 0320 11/28/15 0556 11/29/15 0738 11/30/15 0308 12/01/15 0722  WBC 9.0  < > 7.4 6.3 8.4 10.6* 9.6  NEUTROABS 7.2  --   --  5.2 6.3 7.8*  --   HGB 11.5*  < > 9.6* 10.6* 11.1* 11.6* 11.3*  HCT 37.1  < > 30.5* 32.7* 35.5* 37.0 34.5*  MCV 96.1  < > 92.4 95.3 94.2 96.1 96.4  PLT 203  < > 203 200 271 236 294  < > = values in this interval not displayed. Cardiac Enzymes:  Recent Labs Lab 2015/12/19 2306 11/26/15 0153 11/26/15 1030 11/26/15 1655 11/27/15 0320  TROPONINI 0.34* 0.43* 0.78* 0.79* 0.57*   CBG:  Recent Labs Lab 11/28/15 1526  11/29/15 0736 11/29/15 0937 11/29/15 2016 11/30/15 0809  GLUCAP 207* 119* 84 290* 163*    Recent Results (from the past 240 hour(s))  Urine culture     Status: None   Collection Time: 12-19-2015  5:24 PM  Result Value Ref Range Status   Specimen Description URINE, CATHETERIZED  Final   Special Requests NONE  Final   Culture NO GROWTH 2 DAYS  Final   Report Status 11/27/2015 FINAL  Final  Blood Culture (routine x 2)     Status: None   Collection Time: 2015-11-27  5:41 PM  Result Value Ref Range Status   Specimen Description BLOOD RIGHT HAND  Final   Special Requests BOTTLES DRAWN AEROBIC AND ANAEROBIC 5CC  Final   Culture NO GROWTH 5 DAYS  Final   Report Status 11/30/2015 FINAL  Final  Blood Culture (routine x 2)     Status: None   Collection Time: November 27, 2015  6:40 PM  Result Value Ref Range Status   Specimen Description BLOOD WRIST RIGHT  Final   Special Requests BOTTLES DRAWN AEROBIC ONLY 8CC  Final   Culture NO GROWTH 5 DAYS  Final   Report Status 11/30/2015 FINAL  Final  MRSA PCR Screening     Status: None   Collection Time: 11/26/15  1:38 AM  Result Value Ref Range Status   MRSA by PCR NEGATIVE NEGATIVE Final     Scheduled Meds: . ARIPiprazole  5 mg Oral Daily  . aspirin  81 mg Oral Daily  . calcitRIOL  1 mcg Oral Q T,Th,Sa-HD  . darbepoetin (ARANESP) injection - DIALYSIS  100 mcg Intravenous Q Thu-HD  . docusate sodium  100 mg Oral BID  . feeding supplement (PRO-STAT SUGAR FREE 64)  30 mL Oral BID  . heparin subcutaneous  5,000 Units Subcutaneous 3 times per day  . mometasone-formoterol  2 puff Inhalation BID  . multivitamin  1 tablet Oral QHS  . pantoprazole  40 mg Oral Daily  . sevelamer carbonate  2,400 mg Oral TID WC

## 2015-12-01 NOTE — Care Management Note (Addendum)
Case Management Note  Patient Details  Name: TIM SEVY MRN: [id number] Date of Birth: 1950/10/07  Subjective/Objective:      CM following for progression and d/c planning.              Action/Plan: 12/01/2015 Plan to d/c to SNF, however pt with increasing weakness, nephrologist unsure that pt can tolerate HD as an outpatient. Dr Arlean Hopping to speak with pt husband and plans to consult Pall Care.   Expected Discharge Date:       unknown           Expected Discharge Plan:  Skilled Nursing Facility  In-House Referral:  Clinical Social Work  Discharge planning Services  CM Consult  Post Acute Care Choice:    Choice offered to:     DME Arranged:    DME Agency:     HH Arranged:    HH Agency:     Status of Service:  In process, will continue to follow  Medicare Important Message Given:  Yes Date Medicare IM Given:    Medicare IM give by:    Date Additional Medicare IM Given:    Additional Medicare Important Message give by:     If discussed at Long Length of Stay Meetings, dates discussed:    Additional Comments:  Starlyn Skeans, RN 12/01/2015, 11:16 AM

## 2015-12-02 DIAGNOSIS — D638 Anemia in other chronic diseases classified elsewhere: Secondary | ICD-10-CM

## 2015-12-02 DIAGNOSIS — Z7189 Other specified counseling: Secondary | ICD-10-CM | POA: Insufficient documentation

## 2015-12-02 DIAGNOSIS — Z515 Encounter for palliative care: Secondary | ICD-10-CM

## 2015-12-02 DIAGNOSIS — R0600 Dyspnea, unspecified: Secondary | ICD-10-CM | POA: Insufficient documentation

## 2015-12-02 LAB — CBC WITH DIFFERENTIAL/PLATELET
BASOS ABS: 0 10*3/uL (ref 0.0–0.1)
BASOS PCT: 0 %
EOS ABS: 0 10*3/uL (ref 0.0–0.7)
Eosinophils Relative: 0 %
HCT: 38.8 % (ref 36.0–46.0)
HEMOGLOBIN: 12.2 g/dL (ref 12.0–15.0)
LYMPHS PCT: 8 %
Lymphs Abs: 0.8 10*3/uL (ref 0.7–4.0)
MCH: 30.9 pg (ref 26.0–34.0)
MCHC: 31.4 g/dL (ref 30.0–36.0)
MCV: 98.2 fL (ref 78.0–100.0)
MONO ABS: 0.6 10*3/uL (ref 0.1–1.0)
Monocytes Relative: 6 %
NEUTROS ABS: 8.5 10*3/uL — AB (ref 1.7–7.7)
NEUTROS PCT: 86 %
PLATELETS: 312 10*3/uL (ref 150–400)
RBC: 3.95 MIL/uL (ref 3.87–5.11)
RDW: 18.1 % — ABNORMAL HIGH (ref 11.5–15.5)
WBC: 9.9 10*3/uL (ref 4.0–10.5)

## 2015-12-02 LAB — COMPREHENSIVE METABOLIC PANEL
ALBUMIN: 3.3 g/dL — AB (ref 3.5–5.0)
ALT: 20 U/L (ref 14–54)
ANION GAP: 27 — AB (ref 5–15)
AST: 40 U/L (ref 15–41)
Alkaline Phosphatase: 76 U/L (ref 38–126)
BUN: 48 mg/dL — ABNORMAL HIGH (ref 6–20)
CHLORIDE: 93 mmol/L — AB (ref 101–111)
CO2: 18 mmol/L — AB (ref 22–32)
Calcium: 10.6 mg/dL — ABNORMAL HIGH (ref 8.9–10.3)
Creatinine, Ser: 4.77 mg/dL — ABNORMAL HIGH (ref 0.44–1.00)
GFR calc non Af Amer: 9 mL/min — ABNORMAL LOW (ref >60)
GFR, EST AFRICAN AMERICAN: 10 mL/min — AB (ref >60)
GLUCOSE: 191 mg/dL — AB (ref 65–99)
Potassium: 5 mmol/L (ref 3.5–5.1)
SODIUM: 138 mmol/L (ref 135–145)
Total Bilirubin: 2.1 mg/dL — ABNORMAL HIGH (ref 0.3–1.2)
Total Protein: 8.7 g/dL — ABNORMAL HIGH (ref 6.5–8.1)

## 2015-12-02 LAB — MAGNESIUM: Magnesium: 2.4 mg/dL (ref 1.7–2.4)

## 2015-12-02 MED ORDER — SODIUM CHLORIDE 0.9 % IV SOLN
2.0000 mg/h | INTRAVENOUS | Status: DC
  Administered 2015-12-02: 1 mg/h via INTRAVENOUS
  Filled 2015-12-02: qty 10

## 2015-12-02 MED ORDER — POLYVINYL ALCOHOL 1.4 % OP SOLN
1.0000 [drp] | Freq: Four times a day (QID) | OPHTHALMIC | Status: DC | PRN
  Filled 2015-12-02: qty 15

## 2015-12-02 MED ORDER — LORAZEPAM 2 MG/ML IJ SOLN
1.0000 mg | INTRAMUSCULAR | Status: DC | PRN
  Filled 2015-12-02: qty 1

## 2015-12-02 MED ORDER — BIOTENE DRY MOUTH MT LIQD: 15.0000 mL | OROMUCOSAL | Status: DC | PRN

## 2015-12-02 MED ORDER — ONDANSETRON HCL 4 MG/2ML IJ SOLN: 4.0000 mg | Freq: Four times a day (QID) | INTRAMUSCULAR | Status: DC | PRN

## 2015-12-02 MED ORDER — GLYCOPYRROLATE 0.2 MG/ML IJ SOLN: 0.2000 mg | INTRAMUSCULAR | Status: DC | PRN

## 2015-12-02 MED ORDER — ONDANSETRON 4 MG PO TBDP: 4.0000 mg | ORAL_TABLET | Freq: Four times a day (QID) | ORAL | Status: DC | PRN

## 2015-12-02 MED ORDER — MORPHINE SULFATE (PF) 2 MG/ML IV SOLN
1.0000 mg | INTRAVENOUS | Status: DC | PRN
  Administered 2015-12-02 (×2): 1 mg via INTRAVENOUS

## 2015-12-02 MED ORDER — LORAZEPAM 2 MG/ML IJ SOLN
1.0000 mg | Freq: Once | INTRAMUSCULAR | Status: AC
  Administered 2015-12-02: 1 mg via INTRAVENOUS
  Filled 2015-12-02: qty 1

## 2015-12-02 MED ORDER — GLYCOPYRROLATE 1 MG PO TABS: 1.0000 mg | ORAL_TABLET | ORAL | Status: DC | PRN

## 2015-12-02 MED ORDER — LORAZEPAM 1 MG PO TABS: 1.0000 mg | ORAL_TABLET | ORAL | Status: DC | PRN

## 2015-12-02 MED ORDER — GLYCOPYRROLATE 0.2 MG/ML IJ SOLN
0.4000 mg | Freq: Three times a day (TID) | INTRAMUSCULAR | Status: DC
  Administered 2015-12-03 (×3): 0.4 mg via INTRAVENOUS
  Filled 2015-12-02 (×7): qty 2

## 2015-12-02 MED ORDER — GLYCOPYRROLATE 0.2 MG/ML IJ SOLN
0.2000 mg | INTRAMUSCULAR | Status: DC | PRN
  Administered 2015-12-03: 0.2 mg via INTRAVENOUS
  Filled 2015-12-02 (×2): qty 1

## 2015-12-02 MED ORDER — LORAZEPAM 2 MG/ML PO CONC: 1.0000 mg | ORAL | Status: DC | PRN

## 2015-12-02 MED ORDER — MORPHINE SULFATE (PF) 2 MG/ML IV SOLN
1.0000 mg | Freq: Once | INTRAVENOUS | Status: AC
  Administered 2015-12-02: 1 mg via INTRAVENOUS

## 2015-12-02 NOTE — Progress Notes (Signed)
Report called to Hospice. Pt stable at this time. Morphine gtt running. Family at bedside. Will continue to monitor pt closely. Jillyn Hidden, RN

## 2015-12-02 NOTE — Progress Notes (Signed)
Subjective:  No changes in Confusion/weakness/ HD  Yesterday  Off schedule no sob today /Palliative Care  To see   Objective Vital signs in last 24 hours: Filed Vitals:   12/01/15 1700 12/01/15 1953 12/02/15 0402 12/02/15 0903  BP: 118/73 116/62 118/75 127/71  Pulse: 114 102 98 110  Temp: 98.1 F (36.7 C) 98.3 F (36.8 C) 98.7 F (37.1 C) 98.2 F (36.8 C)  TempSrc: Oral Oral Oral Oral  Resp: 18 19 20 18   Height:      Weight:  56.2 kg (123 lb 14.4 oz)    SpO2: 100% 100% 100% 100%   Weight change: -3.6 kg (-7 lb 15 oz)  Physical Exam: Gen: extremely frail, ill-appearing,  Still with slowed mentation,confused,disoriented to time and place   Heart =Regular ,tachy no RG Resp= CTA  Slightly  poor resp effort/ nonlabored breathing  Abd: soft NT/ ND Ext: no pedal  edema, bilat purplish foot changes L> R,   Dialysis Access:  LUE AVF pos bruit    OP Dialysis Orders: Center: [facility] on MWF . EDW 61.0 kg HD Bath 3.0 k/ 2.25ca Time 3.5 hr Heparin none ( per [facility] RN "was held in past with hip and pelvic fracture. Garth Schlatter not restarted" )Access LUA AVF  PO Calcitriol 1.0 mcg/HD / Mircera 50 q 4 weeks hd Last given 11/18/15  Problems/Plan: 1. AMS/ gen'd weakness - not much better. No obvious cause. Was on imipramine/ aricept/ ativan/ percocet/ neurontin at home. Here getting Abilify w prn ativan and no narcotics. No fever/ normal WBC. NH3 up somewhat. Very frail with FTT, advanced heart failure and ESRD. Poor prognosis, not sure baseline QOL.Today  palliative care  To see. Not sure she will be able to dialyze as OP in this condition Patient is deteriorating rapidly w tachypnea and progressive cyanosis, suspect progressive heart failure.  Have met with palliative care and with family and plan for now is comfort care only, no further aggressive care and no further dialysis.  Needs morphine for air hunger.  Orders are being written by pall care and she may be moved over to 6N.  2. ESRD -   Normal schedule HD TTS (op center= [facility].) HD yest. Vol and lab ok this am. Next hd in am pending Pall.Care consult  3. Volume - has mild IS edema admit cxr, 56.2 kg wt yest ( edw 61 kg) will have lower edw 4. Anemia: HGB 11.3 On ESA. noted trans fer sat 25 % BUT Ferritn >7500 no fe on hd 5. Metabolic bone disease - Admit  Phos 12.6 down to 7.6  On Ca Acetate 2041m tid with meals  C Ca 10.8 Changed to renvela binders. Follow Ca/Phos. Use 2.0 Ca bath 6. Nutrition: Albumin 2.8>2.9. Renal diet, added  prostat.    DErnest Haber PA-C [facility]per [phone]/11/2015,9:22 AM  LOS: 7 days   Pt seen, examined, agree w assess/plan as above with additions as indicated.  RKelly SplinterMD [facility] pager [phone]   cell [phone]/11/2015, 12:50 PM     Labs: Basic Metabolic Panel:  Recent Labs Lab 11/27/15 0320  11/29/15 0738 11/30/15 0308 12/01/15 0722  NA 137  < > 136 135 135  K 5.3*  < > 3.9 4.2 4.3  CL 99*  < > 96* 96* 95*  CO2 20*  < > 19* 22 19*  GLUCOSE 123*  < > 138* 225* 190*  BUN 52*  < > 49* 25* 54*  CREATININE 5.36*  < > 4.89* 3.17* 4.95*  CALCIUM 8.8*  < > 9.6 9.5 10.0  PHOS 12.6*  --   --   --  7.4*  < > = values in this interval not displayed. Liver Function Tests:  Recent Labs Lab 11/28/15 0556 11/29/15 0738 11/30/15 0308 12/01/15 0722  AST 27 27 29   --   ALT 17 17 18   --   ALKPHOS 72 70 70  --   BILITOT 1.4* 1.7* 1.3*  --   PROT 7.5 7.9 7.6  --   ALBUMIN 2.8* 2.9* 2.9* 2.9*   No results for input(s): LIPASE, AMYLASE in the last 168 hours.  Recent Labs Lab 11/13/2015 1813 11/27/15 0320  AMMONIA 47* 22   CBC:  Recent Labs Lab 11/27/15 0320 11/28/15 0556 11/29/15 0738 11/30/15 0308 12/01/15 0722  WBC 7.4 6.3 8.4 10.6* 9.6  NEUTROABS  --  5.2 6.3 7.8*  --   HGB 9.6* 10.6* 11.1* 11.6* 11.3*  HCT 30.5* 32.7* 35.5* 37.0 34.5*  MCV 92.4 95.3 94.2 96.1 96.4  PLT 203 200 271 236 294   Cardiac  Enzymes:  Recent Labs Lab 11/11/2015 2306 11/26/15 0153 11/26/15 1030 11/26/15 1655 11/27/15 0320  TROPONINI 0.34* 0.43* 0.78* 0.79* 0.57*   CBG:  Recent Labs Lab 11/28/15 1526 11/29/15 0736 11/29/15 0937 11/29/15 2016 11/30/15 0809  GLUCAP 207* 119* 84 290* 163*    Studies/Results: No results found. Medications:   . ARIPiprazole  5 mg Oral Daily  . aspirin  81 mg Oral Daily  . calcitRIOL  1 mcg Oral Q T,Th,Sa-HD  . darbepoetin (ARANESP) injection - DIALYSIS  100 mcg Intravenous Q Thu-HD  . docusate sodium  100 mg Oral BID  . feeding supplement (PRO-STAT SUGAR FREE 64)  30 mL Oral BID  . heparin subcutaneous  5,000 Units Subcutaneous 3 times per day  . mometasone-formoterol  2 puff Inhalation BID  . multivitamin  1 tablet Oral QHS  . pantoprazole  40 mg Oral Daily  . sevelamer carbonate  2,400 mg Oral TID WC

## 2015-12-02 NOTE — Clinical Social Work Note (Addendum)
CSW received referral for patient possibly needing residential hospice facility placement.  CSW was informed that patient's family would like [facility] in [place], CSW contacted hospice facility and left a message awaiting for call back from hospice facility.  3:00pm  CSW received phone call from [facility] who requested clinical information to be faxed to facility.  CSW faxed requested information, awaiting review from facility.    4:50 pm CSW received phone call from [facility], who said they can take patient once patient is ready for discharge.  5:10pm  CSW contacted physician to inform her that patient has a bed available at [facility], physician does not feel patient is stable enough to transport to facility tonight and would like to see how patient is doing on Wednesday.  CSW informed [facility], bedside nurse, and family who were at bedside.  CSW provided psychosocial support for patient's family.  Ervin Knack. Brandon Scarbrough, MSW, Theresia Majors [phone] 12/02/2015 12:50 PM

## 2015-12-02 NOTE — Consult Note (Signed)
Consultation Note Date: 12/02/2015   Patient Name: Kathleen [name]  DOB: 04-28-1951  MRN: [id number]  Age / Sex: 65 y.o., female  PCP: Charlynn Court, NP Referring Physician: Robbie Lis, MD  Reason for Consultation: Establishing goals of care  Ms. Chervenak is a 65 yo female with a PMH of ESRD on HD, CHF, Cardiomyopathy, PVD, and DM.  She was admitted on 3/28 with weakness and intermittent altered mental status.  These were felt to be secondary to a CHF exacerbation and demand ischemia.  During her stay she unfortunately has not improved and PMT was consulted as she may be too weak to undergo outpatient dialysis.  However, on the morning on our consultation Ms. Shepperson declined significantly and now appears to be actively dying.  Clinical Assessment/Narrative: I met at bedside with Lattie Haw (daughter), Legrand Como (husband) as well as Michael's son who happens to be a Company secretary.  The family understands that Ms. Feehan has decompensated rapidly and now appears to be dying.   She is certainly too weak for HD at this point.  Her breathing has become labored, she is confused, her pulse rate is labile and her legs have developed mottling.  Lattie Haw requests that comfort medications to be given as her mother is having difficulty breathing and is in pain.  She comments that unfortunately they will not have to make any decision about HD because her mother's health decline is dictating her care.  We discussed a potential move to [place] if she stabilizes enough to be transported.  At this point the family is focused on making her comfortable and saying goodbye.     Contacts/Participants in Discussion: Patient (confused), daughter, husband, step son, Case Mgr, and Dr. Jonnie Finner. Primary Decision Maker: Husband (family as a unit)    SUMMARY OF RECOMMENDATIONS DNR / DNI Full Comfort Measures.  If patient stabilizes she may be transferred to  [facility]. No further HD.  Code Status/Advance Care Planning: DNR   Symptom Management:   Pain and dyspnea:  Morphine GTT with 1 mg bolus PRN  Anxiety:  Ativan PRN  Secretions:  Robinul scheduled and PRN  Palliative Prophylaxis:   Delirium Protocol and Frequent Pain Assessment  Additional Recommendations (Limitations, Scope, Preferences):  Full Comfort Care   Psycho-social/Spiritual:  Support System: Strong Desire for further Chaplaincy support: family chaplain (son) is at bedside. Additional Recommendations: Grief/Bereavement Support  Prognosis: Hours - Days  Discharge Planning: Anticipated Hospital Death vs possible transfer to [facility].   Chief Complaint/ Primary Diagnoses: Present on Admission:  . Severe sepsis ([place]) . Acute respiratory failure ([place]) . Chronic systolic heart failure ([place]) . COPD (chronic obstructive pulmonary disease) ([place]) . Acute encephalopathy . Elevated troponin . Chronic systolic CHF (congestive heart failure) ([place]) . Lactic acidosis . End-stage renal disease on hemodialysis ([place]) . Chronic obstructive pulmonary disease ([place]) . Anemia in chronic kidney disease  I have reviewed the medical record, interviewed the patient and family, and examined the patient. The following aspects are pertinent.  Past Medical History  Diagnosis Date  . Chronic kidney disease   . Diabetes mellitus without complication ([place])   . Hypertension   . COPD (chronic obstructive pulmonary disease) ([place])   . Ischemic cardiomyopathy   . Anemia   . Family history of anesthesia complication     MOTHER HAD NAUSEA  . CHF (congestive heart failure) ([facility])     CHRONIC SYSTOLIC  . Shortness of breath    Social History  Social History  . Marital Status: Married    Spouse Name: N/A  . Number of Children: N/A  . Years of Education: N/A   Occupational History  . Retired    Social History Main Topics  . Smoking status: Former Smoker -- 93  years    Quit date: 08/30/2008  . Smokeless tobacco: Never Used  . Alcohol Use: No  . Drug Use: No  . Sexual Activity: Not Asked   Other Topics Concern  . None   Social History Narrative   Pt lives with husband.   Family History  Problem Relation Age of Onset  . Hypertension Neg Hx    Scheduled Meds: . ARIPiprazole  5 mg Oral Daily  . docusate sodium  100 mg Oral BID  . glycopyrrolate  0.4 mg Intravenous TID  . LORazepam  1 mg Intravenous Once  .  morphine injection  1 mg Intravenous Once  . pantoprazole  40 mg Oral Daily   Continuous Infusions: . morphine     PRN Meds:.acetaminophen, antiseptic oral rinse, [DISCONTINUED] glycopyrrolate **OR** [DISCONTINUED] glycopyrrolate **OR** glycopyrrolate, [DISCONTINUED] LORazepam **OR** [DISCONTINUED] LORazepam **OR** LORazepam, morphine injection, ondansetron **OR** ondansetron (ZOFRAN) IV, polyvinyl alcohol Medications Prior to Admission:  Prior to Admission medications   Medication Sig Start Date End Date Taking? Authorizing Provider  budesonide-formoterol (SYMBICORT) 160-4.5 MCG/ACT inhaler Inhale 2 puffs into the lungs 2 (two) times daily.   Yes Historical Provider, MD  calcium acetate (PHOSLO) 667 MG capsule Take 3 capsules by mouth 3 (three) times daily with meals. 09/09/15  Yes Historical Provider, MD  clopidogrel (PLAVIX) 75 MG tablet Take 75 mg by mouth daily. 04/21/15  Yes Historical Provider, MD  donepezil (ARICEPT) 5 MG tablet Take 1 tablet by mouth at bedtime. 11/15/15  Yes Historical Provider, MD  gabapentin (NEURONTIN) 300 MG capsule Take 300 mg by mouth at bedtime.   Yes Historical Provider, MD  imipramine (TOFRANIL) 50 MG tablet Take 2 tablets by mouth at bedtime. 11/12/15  Yes Historical Provider, MD  LORazepam (ATIVAN) 0.5 MG tablet Take 1 tablet (0.5 mg total) by mouth 2 (two) times daily as needed for anxiety. Patient taking differently: Take 0.5 mg by mouth 3 (three) times daily as needed for anxiety.  03/07/15  Yes  Orson Eva, MD  metoprolol succinate (TOPROL-XL) 25 MG 24 hr tablet Take 1 tablet by mouth daily. 11/12/15  Yes Historical Provider, MD  nitroGLYCERIN (NITROSTAT) 0.4 MG SL tablet Place 0.4 mg under the tongue every 5 (five) minutes as needed for chest pain.   Yes Historical Provider, MD  omeprazole (PRILOSEC) 40 MG capsule Take 40 mg by mouth daily.   Yes Historical Provider, MD  oxyCODONE-acetaminophen (PERCOCET/ROXICET) 5-325 MG per tablet Take 1 tablet by mouth every 4 (four) hours as needed for moderate pain. pain 02/24/15  Yes Historical Provider, MD  aspirin 81 MG tablet Take 81 mg by mouth daily.    Historical Provider, MD  b complex-vitamin c-folic acid (NEPHRO-VITE) 0.8 MG TABS tablet Take 1 tablet by mouth Every Tuesday,Thursday,and Saturday with dialysis.     Historical Provider, MD  calcitRIOL (ROCALTROL) 0.5 MCG capsule Take 2 capsules (1 mcg total) by mouth Every Tuesday,Thursday,and Saturday with dialysis. 03/07/15   Orson Eva, MD  docusate sodium (COLACE) 100 MG capsule Take 100 mg by mouth 2 (two) times daily.     Historical Provider, MD  glycerin adult (GLYCERIN ADULT) 2 G SUPP Place 1 suppository rectally daily as needed for moderate constipation.    Historical  Provider, MD  ondansetron (ZOFRAN) 4 MG tablet Take 4 mg by mouth every 8 (eight) hours as needed for nausea or vomiting.  04/28/15   Historical Provider, MD  Senna-Fennel (NATURAL VEGETABLE LAXATIVE PO) Take 1 tablet by mouth 2 (two) times daily.    Historical Provider, MD   Allergies  Allergen Reactions  . Other Other (See Comments)    Mycins allergy, [place] feeding supplement causes high blood sugar   . Vancomycin Anaphylaxis    Wheezing and respiratory distress noted when given 11/24/15  . Metformin And Related Diarrhea  . Rosuvastatin Calcium Other (See Comments)    Side effects of leg pain and aching with "cholesterol medications", pt requested that no statins be given to her from this time forward  .  Statins Other (See Comments)    Side effects of leg pain and aching with "cholesterol medications", pt requested that no statins be given to her from this time forward  . Carvedilol Nausea Only    D/c'd 05/2015 [facility]  . Latex Rash  . Levaquin [Levofloxacin In D5w] Hives  . Levofloxacin Rash  . Sulfa Antibiotics Hives  . Tobradex [Tobramycin-Dexamethasone] Other (See Comments)    redness    Review of Systems:  Patient unable to give  Physical Exam  Wd, chronically ill appearing female,  Confused, in mild respiratory distress CV irreg Resp  Mod increased work of breathing. Abdomen:  Soft, nt Extremities:  Mottling of LE bilaterally.  Cool to touch  Vital Signs: BP 127/71 mmHg  Pulse 110  Temp(Src) 98.2 F (36.8 C) (Oral)  Resp 18  Ht 5' 4"  (1.626 m)  Wt 56.2 kg (123 lb 14.4 oz)  BMI 21.26 kg/m2  SpO2 100%  SpO2: SpO2: 100 % O2 Device:SpO2: 100 % O2 Flow Rate: .O2 Flow Rate (L/min): 3 L/min  IO: Intake/output summary:  Intake/Output Summary (Last 24 hours) at 12/02/15 1330 Last data filed at 12/02/15 1207  Gross per 24 hour  Intake    240 ml  Output      0 ml  Net    240 ml    LBM: Last BM Date: 12/01/15 Baseline Weight: Weight: 61.689 kg (136 lb) Most recent weight: Weight: 56.2 kg (123 lb 14.4 oz)      Palliative Assessment/Data:  Flowsheet Rows        Most Recent Value   Intake Tab    Referral Department  Hospitalist   Unit at Time of Referral  Cardiac/Telemetry Unit   Palliative Care Primary Diagnosis  Nephrology   Date Notified  12/01/15   Palliative Care Type  New Palliative care   Reason for referral  Clarify Goals of Care, End of Life Care Assistance   Date of Admission  11/01/2015   Date first seen by Palliative Care  12/02/15   # of days Palliative referral response time  1 Day(s)   # of days IP prior to Palliative referral  6   Clinical Assessment    Palliative Performance Scale Score  10%   Pain Max last 24 hours  5   Dyspnea Max Last 24 Hours  7    Anxiety Min Last 24 Hours  7   Psychosocial & Spiritual Assessment    Palliative Care Outcomes    Patient/Family meeting held?  Yes   Who was at the meeting?  patient, daughter, husband, step son   Palliative Care Outcomes  Improved pain interventions, Improved non-pain symptom therapy, Provided end of life care assistance, Counseled regarding  hospice, Provided psychosocial or spiritual support   Patient/Family wishes: Interventions discontinued/not started   Hemodialysis      Additional Data Reviewed:  CBC:    Component Value Date/Time   WBC 9.9 12/02/2015 0907   HGB 12.2 12/02/2015 0907   HCT 38.8 12/02/2015 0907   PLT 312 12/02/2015 0907   MCV 98.2 12/02/2015 0907   NEUTROABS 8.5* 12/02/2015 0907   LYMPHSABS 0.8 12/02/2015 0907   MONOABS 0.6 12/02/2015 0907   EOSABS 0.0 12/02/2015 0907   BASOSABS 0.0 12/02/2015 0907   Comprehensive Metabolic Panel:    Component Value Date/Time   NA 138 12/02/2015 0907   K 5.0 12/02/2015 0907   CL 93* 12/02/2015 0907   CO2 18* 12/02/2015 0907   BUN 48* 12/02/2015 0907   CREATININE 4.77* 12/02/2015 0907   GLUCOSE 191* 12/02/2015 0907   CALCIUM 10.6* 12/02/2015 0907   AST 40 12/02/2015 0907   ALT 20 12/02/2015 0907   ALKPHOS 76 12/02/2015 0907   BILITOT 2.1* 12/02/2015 0907   PROT 8.7* 12/02/2015 0907   ALBUMIN 3.3* 12/02/2015 0907     Time In: 12:30  Time Out: 1:40 Time Total: 70 Greater than 50%  of this time was spent counseling and coordinating care related to the above assessment and plan.  Signed by:  Imogene Burn, PA-C Palliative Medicine Pager: [phone]  12/02/2015, 1:30 PM  Please contact Palliative Medicine Team phone at [phone] for questions and concerns.

## 2015-12-02 NOTE — Care Management Note (Signed)
Case Management Note  Patient Details  Name: Kathleen [name] MRN: [id number] Date of Birth: 1951/03/28  Subjective/Objective:       CM following for progression and d/c planning.             Action/Plan: 12/02/2015 Spoke with pt husband 12/01/15 re pt decline and plans for meeting with MD. Mr Tarlton questioned his wife's progression and this CM spoke with him re pt increasing weakness in possible inability to tol ongoing HD. Dr Jonnie Finner arrived to speak with the pt husband, Mr Gewirtz. 12/02/2015 Spoke with Mr Mcghee and Mrs Vantassel's daughter, Mr Flener stepdaughter re rapid decline of this pt , as Mr Bitterman was verbalizing a possible plan to try to take the pt home and transport her to outpatient HD. This CM and pt RN Nira Conn spoke with Mr Welke explaining that the pt is unable to be positioned in a sitting position at this time and could not tol a drive additional she is declining so quickly it is unlikely that the nephrologist would agree to attempt HD. His major concern is the pt sister, who had insisted that he bring the pt home. At this time the pt daughter supported Mr Rodriges stating that he should not be concerned about the pt sister but about the comfort of the pt only.  Pall team member, Agustina Caroli arrived and met with Mr Constantin and his son as well as this CM and Dr Jonnie Finner, she then met with pt daughter. All are in agreement at this time for comfort care for this pt , we will ask the CSW, Randall Hiss to contact [facility] re possible transfer to that facility, if the pt is able to transport. Will continue to follow.    Expected Discharge Date:                  Expected Discharge Plan:  [place] if condition permits.  In-House Referral:  Clinical Social Work  Ship broker  CM Consult  Post Acute Care Choice:    Choice offered to:     DME Arranged:    DME Agency:     HH Arranged:    HH Agency:     Status of Service:  Completed, signed off  Medicare  Important Message Given:  Yes Date Medicare IM Given:    Medicare IM give by:    Date Additional Medicare IM Given:    Additional Medicare Important Message give by:     If discussed at [place] of Stay Meetings, dates discussed:    Additional Comments:  Adron Bene, RN 12/02/2015, 1:50 PM

## 2015-12-02 NOTE — Progress Notes (Addendum)
Patient ID: Kathleen [name], female   DOB: 12-Jun-1951, 65 y.o.   MRN: [id number] [facility] PROGRESS NOTE  Kathleen [name] WHQ:[id number] DOB: October 19, 1950 DOA: 11/24/2015 PCP: Kathleen Morales, NP  Brief narrative:    65 year old female with past medical history of end-stage renal disease on hemodialysis (TTS), COPD, CAD, ischemic cardiomyopathy, chronic systolic CHF, status post CABG, PVD status post stenting who presented to [facility] increasingly weak after dialysis 3 days prior to this admission. Patient apparently had some cough but no shortness of breath. No chest pain and nausea, vomiting or diarrhea.  Chest x-ray on admission showed infiltrates and patient was started on empiric antibiotics. She required intermittent BiPAP. She also had episodes of altered mental status. CT scan of the head showed no acute intracranial findings.  Hospital course complicated with worsening overall decline. Per renal, she may no longer be a candidate for HD at this point. PCT consulted for goals of care.   Assessment/Plan:    Acute encephalopathy / generalized weakness  - Unclear etiology - EEG, MRI brain and CT head unrevealing other than age related cerebral atrophy  - RPR, HIV and B12 WNL - Ammonia WNL - Overall decline, feels worse this morning - Appreciate palliative care for goals of care  Possible UTI / Leukocytosis  - UA abnormal, but UCx showed no growth - Has completed abx at this point  Mildly elevated troponin - Troponin level peaked at 0.79 - Likely demand ischemia in patient with CAD and ESRD, CHF exacerbation - No acute ischemic changes on 12 lead EKG   Acute on Chronic systolic CHF last EF measured was 25-30% - Continue daily weight and intake and output - Palliative care consulted for goals of care  ESRD on hemodialysis on T/Th/Sat / Hyperkalemia  - Appreciate renal following  - May not be able to go for dialysis tomorrow we will follow-up with renal and see how  patient is doing next 24 hours  CAD status post CABG - PVD status post stenting - Stable  - Continue aspirin daily   COPD - Continue Dulera - Stable   Anemia in chronic kidney disease  - Stable hemoglobin  Metabolic bone disease  - Continue sevelamer   DVT Prophylaxis  - SubQ heparin in hospital   Code Status: DNR/DNI Family Communication:  Spoke with her husband over the phone 4/1; left VM today with my cell numebr to call me back for updates; no answer on his cell phone  Disposition Plan: unable to predict at this point,   IV access:  Peripheral IV  Procedures and diagnostic studies:    Ct Head Wo Contrast 11/16/2015  Stable would age related cerebral atrophy, ventriculomegaly and periventricular white matter disease. No acute intracranial findings.   Mr Brain Wo Contrast 11/26/2015  1. No acute intracranial process identified. 2. Generalized cerebral atrophy with mild to moderate chronic small vessel ischemic disease.   Dg Chest Portable 1 View 11/12/2015  Mild-to-moderate congestive heart failure, not appreciably changed. Stable trace bilateral pleural effusions.   Dg Chest Port 1 View 11/28/2015   Bilateral interstitial coarsening above prior baseline, edema versus atypical infection.   EEG 11/26/15;abnormal EEG demonstrating a mild diffuse slowing of electrocerebral activity. This can be seen in a wide variety of encephalopathic state including those of a toxic, metabolic, or degenerative nature-negative seizure activity.  Medical Consultants:  Nephrology Palliative care   Other Consultants:  PT  IAnti-Infectives:   Vanco 3/28>> 3/29  Zosyn 3/28>> 3/29  Manson Passey, MD  [facility] Pager [phone]  Time spent in minutes: 25 minutes  If 7PM-7AM, please contact night-coverage [email or web address] Password [email or web address] 12/02/2015, 10:48 AM   LOS: 7 days    HPI/Subjective: No overnight events. This am lethargic, complains of shortness of  breath.  Objective: Filed Vitals:   12/01/15 1700 12/01/15 1953 12/02/15 0402 12/02/15 0903  BP: 118/73 116/62 118/75 127/71  Pulse: 114 102 98 110  Temp: 98.1 F (36.7 C) 98.3 F (36.8 C) 98.7 F (37.1 C) 98.2 F (36.8 C)  TempSrc: Oral Oral Oral Oral  Resp: Height:      Weight:  56.2 kg (123 lb 14.4 oz)    SpO2: 100% 100% 100% 100%    Intake/Output Summary (Last 24 hours) at 12/02/15 1048 Last data filed at 12/02/15 1023  Gross per 24 hour  Intake    480 ml  Output      0 ml  Net    480 ml    Exam:   General:  Lethargic, slow to respond   Cardiovascular: tahycardic, S1/S2 (+)  Respiratory: diminished breath sounds, no wheezing   Abdomen: appreciate bowel sounds, non tender   Extremities:LE edema, bilateral pulses   Neuro: Non focal   Data Reviewed: Basic Metabolic Panel:  Recent Labs Lab 11/27/15 0320 11/28/15 0556 11/29/15 4540 11/30/15 0308 12/01/15 0722 12/01/15 1242 12/02/15 0907  NA 137 135 136 135 135  --  138  K 5.3* 3.9 3.9 4.2 4.3  --  5.0  CL 99* 97* 96* 96* 95*  --  93*  CO2 20* 21* 19* 22 19*  --  18*  GLUCOSE 123* 170* 138* 225* 190*  --  191*  BUN 52* 30* 49* 25* 54*  --  48*  CREATININE 5.36* 3.60* 4.89* 3.17* 4.95*  --  4.77*  CALCIUM 8.8* 9.1 9.6 9.5 10.0  --  10.6*  MG  --  2.1 2.3 2.1  --  2.2 2.4  PHOS 12.6*  --   --   --  7.4*  --   --    Liver Function Tests:  Recent Labs Lab 11/26/15 0615  11/28/15 0556 11/29/15 0738 11/30/15 0308 12/01/15 0722 12/02/15 0907  AST 33  --  --  40  ALT 17  --  --  20  ALKPHOS 79  --  72 70 70  --  76  BILITOT 1.1  --  1.4* 1.7* 1.3*  --  2.1*  PROT 6.8  --  7.5 7.9 7.6  --  8.7*  ALBUMIN 2.3*  < > 2.8* 2.9* 2.9* 2.9* 3.3*  < > = values in this interval not displayed.  Recent Labs Lab 11/13/2015 1813 11/27/15 0320  AMMONIA 47* 22   CBC:  Recent Labs Lab 11/19/2015 1813  11/28/15 0556 11/29/15 0738 11/30/15 0308 12/01/15 0722  12/02/15 0907  WBC 9.0  < > 6.3 8.4 10.6* 9.6 9.9  NEUTROABS 7.2  --  5.2 6.3 7.8*  --  8.5*  HGB 11.5*  < > 10.6* 11.1* 11.6* 11.3* 12.2  HCT 37.1  < > 32.7* 35.5* 37.0 34.5* 38.8  MCV 96.1  < > 95.3 94.2 96.1 96.4 98.2  PLT 203  < > 200 271 236 294 312  < > = values in this interval not displayed. Cardiac Enzymes:  Recent Labs Lab 11/05/2015 2306 11/26/15 0153 11/26/15 1030 11/26/15 1655 11/27/15 0320  TROPONINI 0.34* 0.43*  0.78* 0.79* 0.57*   CBG:  Recent Labs Lab 11/28/15 1526 11/29/15 0736 11/29/15 0937 11/29/15 2016 11/30/15 0809  GLUCAP 207* 119* 84 290* 163*    Recent Results (from the past 240 hour(s))  Urine culture     Status: None   Collection Time: 12/01/15  5:24 PM  Result Value Ref Range Status   Specimen Description URINE, CATHETERIZED  Final   Special Requests NONE  Final   Culture NO GROWTH 2 DAYS  Final   Report Status 11/27/2015 FINAL  Final  Blood Culture (routine x 2)     Status: None   Collection Time: 12/01/15  5:41 PM  Result Value Ref Range Status   Specimen Description BLOOD RIGHT HAND  Final   Special Requests BOTTLES DRAWN AEROBIC AND ANAEROBIC 5CC  Final   Culture NO GROWTH 5 DAYS  Final   Report Status 11/30/2015 FINAL  Final  Blood Culture (routine x 2)     Status: None   Collection Time: 12/01/2015  6:40 PM  Result Value Ref Range Status   Specimen Description BLOOD WRIST RIGHT  Final   Special Requests BOTTLES DRAWN AEROBIC ONLY 8CC  Final   Culture NO GROWTH 5 DAYS  Final   Report Status 11/30/2015 FINAL  Final  MRSA PCR Screening     Status: None   Collection Time: 11/26/15  1:38 AM  Result Value Ref Range Status   MRSA by PCR NEGATIVE NEGATIVE Final     Scheduled Meds: . ARIPiprazole  5 mg Oral Daily  . aspirin  81 mg Oral Daily  . calcitRIOL  1 mcg Oral Q T,Th,Sa-HD  . darbepoetin (ARANESP) injection - DIALYSIS  100 mcg Intravenous Q Thu-HD  . docusate sodium  100 mg Oral BID  . feeding supplement (PRO-STAT SUGAR FREE  64)  30 mL Oral BID  . heparin subcutaneous  5,000 Units Subcutaneous 3 times per day  . mometasone-formoterol  2 puff Inhalation BID  . multivitamin  1 tablet Oral QHS  . pantoprazole  40 mg Oral Daily  . sevelamer carbonate  2,400 mg Oral TID WC

## 2015-12-02 NOTE — Clinical Documentation Improvement (Signed)
Hospitalist  Would you please clarify if sepsis was ruled in or ruled out, noted in H&P?  Sepsis ruled in  Sepsis ruled out.  Other  Clinically Undetermined  Document any associated diagnoses/conditions.   Supporting Information: Initial vital signs in ED - BP 115/59, Pulse 97, Temp 98.2, Resp 14; Lactic acid was 3.6 in ED and then 3.8 - 3 hours later.   Please exercise your independent, professional judgment when responding. A specific answer is not anticipated or expected.   Thank Modesta Messing [facility] Health Information Management [place] [phone]

## 2015-12-03 DIAGNOSIS — F411 Generalized anxiety disorder: Secondary | ICD-10-CM

## 2015-12-03 MED ORDER — LORAZEPAM 2 MG/ML IJ SOLN
1.0000 mg | Freq: Three times a day (TID) | INTRAMUSCULAR | Status: DC
  Administered 2015-12-03 (×2): 1 mg via INTRAVENOUS
  Filled 2015-12-03 (×2): qty 1

## 2015-12-03 MED ORDER — MORPHINE BOLUS VIA INFUSION
1.0000 mg | INTRAVENOUS | Status: DC | PRN
  Administered 2015-12-03: 1 mg via INTRAVENOUS
  Filled 2015-12-03 (×2): qty 1

## 2015-12-03 MED ORDER — SODIUM CHLORIDE 0.9 % IV SOLN: 1.0000 mg/h | INTRAVENOUS | Status: AC

## 2015-12-03 MED ORDER — MORPHINE SULFATE (PF) 2 MG/ML IV SOLN: 1.0000 mg | INTRAVENOUS | Status: DC | PRN

## 2015-12-03 MED ORDER — MORPHINE SULFATE (PF) 2 MG/ML IV SOLN: 1.0000 mg | INTRAVENOUS | Status: AC | PRN

## 2015-12-03 MED ORDER — SODIUM CHLORIDE 0.9 % IV SOLN: 1.0000 mg/h | INTRAVENOUS | Status: DC

## 2015-12-03 NOTE — Progress Notes (Addendum)
Palliative Medicine RN Note: Pt in bed, non responsive. She has dried mucus on face, [place], and gown, but family refused to allow pt to be cleaned earlier. As daughter was the one refusing and the step-son is here now, I was allowed to wash the pt's face, clean her [place], and change her damp gown. Patient is having apneic episodes with RR as low at 2/minute.   PMT recommends DO NOT transfer pt, as she is very likely to die during transportation. Prognosis per PMT providers is minutes to hours. Please contact our office with any questions or concerns.  Donn Pierini, RN, BSN, [facility] 12/03/2015 10:10 AM Cell [phone] 8:00-4:00 Monday-Friday Office [phone]

## 2015-12-03 NOTE — Progress Notes (Signed)
Patient increasingly cyanotic, esp. In RUE.    Extremities cool to touch.  Family in attendance and more will be arriving.  Palliative care adjusting medication for comfort, which has been effective.

## 2015-12-03 NOTE — Discharge Summary (Addendum)
Physician Discharge Summary  Kathleen [name] ZOX:[id number] DOB: 09-08-1950 DOA: 12/07/2015  PCP: Hal Morales, NP Admit date: 12/07/15 Discharge date: 12/03/2015  Recommendations for Outpatient Follow-up:  1. May continue morphine drip per hospice care   Discharge Diagnoses:  Principal Problem:   Acute encephalopathy Active Problems:   ESRD on dialysis (HCC)   Chronic systolic heart failure (HCC)   Severe sepsis (HCC)   Acute respiratory failure (HCC)   COPD (chronic obstructive pulmonary disease) (HCC)   Elevated troponin   Chronic systolic CHF (congestive heart failure) (HCC)   Lactic acidosis   End-stage renal disease on hemodialysis (HCC)   Chronic obstructive pulmonary disease (HCC)   Anemia in chronic kidney disease   End of life care   Encounter for hospice care discussion   Dyspnea    Discharge Condition: stable   Diet recommendation: as tolerated   History of present illness:  65 year old female with past medical history of end-stage renal disease on hemodialysis (TTS), COPD, CAD, ischemic cardiomyopathy, chronic systolic CHF, status post CABG, PVD status post stenting who presented to [facility] increasingly weak after dialysis 3 days prior to this admission. Patient apparently had some cough but no shortness of breath. No chest pain and nausea, vomiting or diarrhea.  Chest x-ray on admission showed infiltrates and patient was started on empiric antibiotics. She required intermittent BiPAP. She also had episodes of altered mental status. CT scan of the head showed no acute intracranial findings.  Hospital course complicated with worsening overall decline. Per renal, she may no longer be a candidate for HD at this point. PCT consulted for goals of care.    Hospital Course:   Assessment/Plan:    Acute metabolic encephalopathy / generalized weakness / uremia  - Likely due to progressive ESRD no longer on HD - EEG, MRI brain and CT head unrevealing  other than age related cerebral atrophy  - RPR, HIV and B12 WNL - Ammonia WNL - Overall decline, feels worse this morning - Per renal patient no longer a candidate for hemodialysis at this point - Appreciate palliative care for goals of care  Possible UTI / Leukocytosis  - UA abnormal, but UCx showed no growth - Has completed abx at this point  Mildly elevated troponin - Troponin level peaked at 0.79 - Likely demand ischemia in patient with CAD and ESRD, CHF exacerbation - No acute ischemic changes on 12 lead EKG   Acute on Chronic systolic CHF last EF measured was 25-30% - Continue daily weight and intake and output - Palliative care consulted for goals of care  ESRD on hemodialysis on T/Th/Sat / Hyperkalemia  - Appreciate renal input, pt not a candidate for HD at this point   CAD status post CABG - PVD status post stenting - Stable   COPD - Stable - Pt on morphine drip   Anemia in chronic kidney disease  - Stable hemoglobin  Metabolic bone disease  - On morphine drip to ensure comfort - stop po meds on discharge   DVT Prophylaxis  - SubQ heparin in hospital   Code Status: DNR/DNI  IV access:  Peripheral IV  Procedures and diagnostic studies:   Ct Head Wo Contrast 2015-12-07 Stable would age related cerebral atrophy, ventriculomegaly and periventricular white matter disease. No acute intracranial findings.   Mr Brain Wo Contrast 11/26/2015 1. No acute intracranial process identified. 2. Generalized cerebral atrophy with mild to moderate chronic small vessel ischemic disease.   Dg Chest Portable 1  View 10/29/2015 Mild-to-moderate congestive heart failure, not appreciably changed. Stable trace bilateral pleural effusions.   Dg Chest Port 1 View 11/28/2015 Bilateral interstitial coarsening above prior baseline, edema versus atypical infection.   EEG 11/26/15;abnormal EEG demonstrating a mild diffuse slowing of electrocerebral activity. This can be seen  in a wide variety of encephalopathic state including those of a toxic, metabolic, or degenerative nature-negative seizure activity.  Medical Consultants:  Nephrology Palliative care   Other Consultants:  PT  IAnti-Infectives:   Vanco 3/28>> 3/29  Zosyn 3/28>> 3/29    Signed:  Manson Passey, MD  [facility] 12/03/2015, 6:21 AM  Pager #: [phone]  Time spent in minutes: less than 30 minutes    Discharge Exam: Filed Vitals:   12/02/15 0903 12/02/15 1628  BP: 127/71 104/61  Pulse: 110 115  Temp: 98.2 F (36.8 C) 99.9 F (37.7 C)  Resp: 18 12   Filed Vitals:   12/01/15 1953 12/02/15 0402 12/02/15 0903 12/02/15 1628  BP: 116/62 118/75 127/71 104/61  Pulse: 102 98 110 115  Temp: 98.3 F (36.8 C) 98.7 F (37.1 C) 98.2 F (36.8 C) 99.9 F (37.7 C)  TempSrc: Oral Oral Oral Oral  Resp: 19 20 18 12   Height:      Weight: 56.2 kg (123 lb 14.4 oz)     SpO2: 100% 100% 100% 100%    General: Pt is not in acute distress Cardiovascular: Regular rate and rhythm, S1/S2 + Respiratory: no wheezing, no rhonchi Abdominal: nn tender, non distended Extremities: palpable pulses  Neuro: Nonfocal  Discharge Instructions  Discharge Instructions    Diet - low sodium heart healthy    Complete by:  As directed      Increase activity slowly    Complete by:  As directed             Medication List    STOP taking these medications        aspirin 81 MG tablet     b complex-vitamin c-folic acid 0.8 MG Tabs tablet     budesonide-formoterol 160-4.5 MCG/ACT inhaler  Commonly known as:  SYMBICORT     calcitRIOL 0.5 MCG capsule  Commonly known as:  ROCALTROL     calcium acetate 667 MG capsule  Commonly known as:  PHOSLO     clopidogrel 75 MG tablet  Commonly known as:  PLAVIX     docusate sodium 100 MG capsule  Commonly known as:  COLACE     donepezil 5 MG tablet  Commonly known as:  ARICEPT     gabapentin 300 MG capsule  Commonly known as:   NEURONTIN     glycerin adult 2 g Supp  Generic drug:  glycerin adult     imipramine 50 MG tablet  Commonly known as:  TOFRANIL     LORazepam 0.5 MG tablet  Commonly known as:  ATIVAN     metoprolol succinate 25 MG 24 hr tablet  Commonly known as:  TOPROL-XL     NATURAL VEGETABLE LAXATIVE PO     nitroGLYCERIN 0.4 MG SL tablet  Commonly known as:  NITROSTAT     omeprazole 40 MG capsule  Commonly known as:  PRILOSEC     ondansetron 4 MG tablet  Commonly known as:  ZOFRAN     oxyCODONE-acetaminophen 5-325 MG tablet  Commonly known as:  PERCOCET/ROXICET      TAKE these medications        morphine 2 MG/ML injection  Inject 0.5 mLs (1 mg  total) into the vein every 15 (fifteen) minutes as needed (or dyspnea).     morphine 250 mg in sodium chloride 0.9 % 240 mL  Inject 1 mg/hr into the vein continuous.          The results of significant diagnostics from this hospitalization (including imaging, microbiology, ancillary and laboratory) are listed below for reference.    Significant Diagnostic Studies: Ct Head Wo Contrast  12-10-15  CLINICAL DATA:  Weakness and lethargy after dialysis. EXAM: CT HEAD WITHOUT CONTRAST TECHNIQUE: Contiguous axial images were obtained from the base of the skull through the vertex without intravenous contrast. COMPARISON:  09/22/2015 FINDINGS: Stable mild age related cerebral atrophy, ventriculomegaly and periventricular white matter disease. No acute intracranial findings such as hemispheric infarction or intracranial hemorrhage. No extra-axial fluid collections are identified. No mass lesions. The brainstem and cerebellum are grossly normal. No significant bony findings. The paranasal sinuses and mastoid air cells are clear. The globes are intact. IMPRESSION: Stable would age related cerebral atrophy, ventriculomegaly and periventricular white matter disease. No acute intracranial findings. Electronically Signed   By: Rudie Meyer M.D.   On:  2015/12/10 18:33   Mr Brain Wo Contrast  11/26/2015  CLINICAL DATA:  Evaluation for acute encephalopathy, generalized weakness. EXAM: MRI HEAD WITHOUT CONTRAST TECHNIQUE: Multiplanar, multiecho pulse sequences of the brain and surrounding structures were obtained without intravenous contrast. COMPARISON:  Prior CT from 12/10/2015. FINDINGS: Study degraded by motion artifact. Mild diffuse prominence of the CSF containing spaces compatible with generalized age-related cerebral atrophy. Patchy T2/FLAIR hyperintensity within the periventricular and deep white matter both cerebral hemispheres most consistent with chronic small vessel ischemic disease, mild to moderate nature. Chronic small vessel ischemic type changes present within the pons. No abnormal foci of restricted diffusion to suggest acute infarct. Gray-white matter differentiation maintained. Major intracranial vascular flow voids are preserved. No acute or chronic intracranial hemorrhage. No definite areas of chronic infarction. No mass lesion, midline shift, or mass effect. Mild ventricular prominence related to global parenchymal volume loss without hydrocephalus. No extra-axial fluid collection. Major dural sinuses are grossly patent. Craniocervical junction within normal limits. Visualized upper cervical spine unremarkable. Pituitary gland within normal limits. No acute abnormality about the orbits. Paranasal sinuses are grossly clear. Mild scattered opacity within the mastoid air cells bilaterally. Inner ear structures grossly normal. Bone marrow signal intensity within normal limits. No acute scalp soft tissue abnormality. IMPRESSION: 1. No acute intracranial process identified. 2. Generalized cerebral atrophy with mild to moderate chronic small vessel ischemic disease. Electronically Signed   By: Rise Mu M.D.   On: 11/26/2015 01:13   Dg Chest Port 1 View  11/28/2015  CLINICAL DATA:  Shortness of breath, altered mental status,  confusion. History end-stage renal disease on dialysis, chronic renal failure, sepsis, COPD with acute respiratory failure. EXAM: PORTABLE CHEST 1 VIEW COMPARISON:  Portable chest x-ray of Dec 10, 2015 FINDINGS: The lungs are well-expanded. The pulmonary interstitial markings remain increased bilaterally. There is no alveolar infiltrate. There is no pleural effusion. The cardiac silhouette is enlarged. The central pulmonary vascularity is mildly prominent. The patient has undergone previous CABG. There is a fracture of the uppermost sternal wire. IMPRESSION: Cardiomegaly with mild pulmonary interstitial edema, stable. Underlying COPD. No acute change since yesterday's study. Electronically Signed   By: David  [place] M.D.   On: 11/28/2015 15:18   Dg Chest Portable 1 View  Dec 10, 2015  CLINICAL DATA:  Shortness of breath EXAM: PORTABLE CHEST 1 VIEW COMPARISON:  Chest  radiograph from earlier today. FINDINGS: Stable appearance of the sternotomy wires with discontinuity in the upper most sternotomy wire. Surgical sutures overlie the left lung base. Stable cardiomediastinal silhouette with mild cardiomegaly. No pneumothorax. Probable trace bilateral pleural effusions. Mild-to-moderate pulmonary edema is not appreciably changed. IMPRESSION: Mild-to-moderate congestive heart failure, not appreciably changed. Stable trace bilateral pleural effusions. Electronically Signed   By: Delbert Phenix M.D.   On: December 14, 2015 19:46   Dg Chest Port 1 View  12/14/2015  CLINICAL DATA:  Weakness and lethargy for 1 day EXAM: PORTABLE CHEST 1 VIEW COMPARISON:  09/15/2015 FINDINGS: There is chronic cardiopericardial enlargement. Patient status post CABG. Grossly stable aortic and hilar contours when accounting for rightward rotation. Postsurgical changes to the left lung. Interstitial coarsening above baseline. No focal consolidation, effusion, or air leak. IMPRESSION: Bilateral interstitial coarsening above prior baseline, edema versus  atypical infection. Electronically Signed   By: Marnee Spring M.D.   On: December 14, 2015 17:50    Microbiology: Recent Results (from the past 240 hour(s))  Urine culture     Status: None   Collection Time: 12/14/2015  5:24 PM  Result Value Ref Range Status   Specimen Description URINE, CATHETERIZED  Final   Special Requests NONE  Final   Culture NO GROWTH 2 DAYS  Final   Report Status 11/27/2015 FINAL  Final  Blood Culture (routine x 2)     Status: None   Collection Time: 12/14/15  5:41 PM  Result Value Ref Range Status   Specimen Description BLOOD RIGHT HAND  Final   Special Requests BOTTLES DRAWN AEROBIC AND ANAEROBIC 5CC  Final   Culture NO GROWTH 5 DAYS  Final   Report Status 11/30/2015 FINAL  Final  Blood Culture (routine x 2)     Status: None   Collection Time: Dec 14, 2015  6:40 PM  Result Value Ref Range Status   Specimen Description BLOOD WRIST RIGHT  Final   Special Requests BOTTLES DRAWN AEROBIC ONLY 8CC  Final   Culture NO GROWTH 5 DAYS  Final   Report Status 11/30/2015 FINAL  Final  MRSA PCR Screening     Status: None   Collection Time: 11/26/15  1:38 AM  Result Value Ref Range Status   MRSA by PCR NEGATIVE NEGATIVE Final    Comment:        The GeneXpert MRSA Assay (FDA approved for NASAL specimens only), is one component of a comprehensive MRSA colonization surveillance program. It is not intended to diagnose MRSA infection nor to guide or monitor treatment for MRSA infections.      Labs: Basic Metabolic Panel:  Recent Labs Lab 11/27/15 0320 11/28/15 0556 11/29/15 1610 11/30/15 0308 12/01/15 0722 12/01/15 1242 12/02/15 0907  NA 137 135 136 135 135  --  138  K 5.3* 3.9 3.9 4.2 4.3  --  5.0  CL 99* 97* 96* 96* 95*  --  93*  CO2 20* 21* 19* 22 19*  --  18*  GLUCOSE 123* 170* 138* 225* 190*  --  191*  BUN 52* 30* 49* 25* 54*  --  48*  CREATININE 5.36* 3.60* 4.89* 3.17* 4.95*  --  4.77*  CALCIUM 8.8* 9.1 9.6 9.5 10.0  --  10.6*  MG  --  2.1 2.3 2.1  --   2.2 2.4  PHOS 12.6*  --   --   --  7.4*  --   --    Liver Function Tests:  Recent Labs Lab 11/28/15 0556 11/29/15 9604 11/30/15 0308  12/01/15 0722 12/02/15 0907  AST 27 27 29   --  40  ALT 17 17 18   --  20  ALKPHOS 72 70 70  --  76  BILITOT 1.4* 1.7* 1.3*  --  2.1*  PROT 7.5 7.9 7.6  --  8.7*  ALBUMIN 2.8* 2.9* 2.9* 2.9* 3.3*   No results for input(s): LIPASE, AMYLASE in the last 168 hours.  Recent Labs Lab 11/27/15 0320  AMMONIA 22   CBC:  Recent Labs Lab 11/28/15 0556 11/29/15 0738 11/30/15 0308 12/01/15 0722 12/02/15 0907  WBC 6.3 8.4 10.6* 9.6 9.9  NEUTROABS 5.2 6.3 7.8*  --  8.5*  HGB 10.6* 11.1* 11.6* 11.3* 12.2  HCT 32.7* 35.5* 37.0 34.5* 38.8  MCV 95.3 94.2 96.1 96.4 98.2  PLT 200 271 236 294 312   Cardiac Enzymes:  Recent Labs Lab 11/26/15 1030 11/26/15 1655 11/27/15 0320  TROPONINI 0.78* 0.79* 0.57*   BNP: BNP (last 3 results)  Recent Labs  05/05/15 1520  BNP >4500.0*    ProBNP (last 3 results) No results for input(s): PROBNP in the last 8760 hours.  CBG:  Recent Labs Lab 11/28/15 1526 11/29/15 0736 11/29/15 0937 11/29/15 2016 11/30/15 0809  GLUCAP 207* 119* 84 290* 163*

## 2015-12-03 NOTE — Progress Notes (Signed)
Patient is dying, on morphine drip, RR down to 6.  Family at bedside.  No new suggestions.   Vinson Moselle MD [employer] pager [phone]    cell [phone] 12/03/2015, 10:31 AM

## 2015-12-03 NOTE — Progress Notes (Addendum)
Daily Progress Note   Patient Name: Kathleen [name]       Date: 12/03/2015 DOB: July 03, 1951  Age: 64 y.o. MRN#: [id number] Attending Physician: Alison Murray, MD Primary Care Physician: Hal Morales, NP Admit Date: 12-01-2015   Hospital death anticipated.  Recommend not moving patient to Hospice House at this point.  Reason for Consultation/Follow-up: Establishing goals of care  Kathleen [name] is a 65 yo female with a PMH of ESRD on HD, CHF, Cardiomyopathy, PVD, and DM. She was admitted on 2022-12-01 with weakness and intermittent altered mental status. These were felt to be secondary to a CHF exacerbation and demand ischemia. During her stay she unfortunately has not improved and PMT was consulted as she may be too weak to undergo outpatient dialysis. However, on the morning of our consultation Kathleen [name] declined significantly and appeared to be actively dying.  Subjective: NA  Interval Events: Patient with apneic breathing.  Slightly elevated temperature.  No family in the room at the moment.  Patient opens her eyes briefly and appears to attempt to sit up.  Spoke with RN.  Will schedule ativan as I would like to prevent any anxiety.  Length of Stay: 8 days  Current Medications: Scheduled Meds:  . ARIPiprazole  5 mg Oral Daily  . docusate sodium  100 mg Oral BID  . glycopyrrolate  0.4 mg Intravenous TID  . pantoprazole  40 mg Oral Daily    Continuous Infusions: . morphine 1 mg/hr (12/02/15 1330)    PRN Meds: acetaminophen, antiseptic oral rinse, [DISCONTINUED] glycopyrrolate **OR** [DISCONTINUED] glycopyrrolate **OR** glycopyrrolate, [DISCONTINUED] LORazepam **OR** [DISCONTINUED] LORazepam **OR** LORazepam, morphine injection, ondansetron **OR** ondansetron (ZOFRAN) IV, polyvinyl  alcohol  Physical Exam      Wd, chronically ill appearing female.  Actively dying.  Slightly diaphoretic CV:  Tachycardic Resp:  Apneic breathing (approx q 40 sec)          Vital Signs: BP 104/61 mmHg  Pulse 115  Temp(Src) 99.9 F (37.7 C) (Oral)  Resp 12  Ht  (1.626 m)  Wt 56.2 kg (123 lb 14.4 oz)  BMI 21.26 kg/m2  SpO2 100% SpO2: SpO2: 100 % O2 Device: O2 Device: Nasal Cannula O2 Flow Rate: O2 Flow Rate (L/min): 6 L/min  Intake/output summary:  Intake/Output Summary (Last 24 hours) at 12/03/15  1610 Last data filed at 12/02/15 1921  Gross per 24 hour  Intake    240 ml  Output      0 ml  Net    240 ml   LBM: Last BM Date: 12/01/15 Baseline Weight: Weight: 61.689 kg (136 lb) Most recent weight: Weight: 56.2 kg (123 lb 14.4 oz)       Palliative Assessment/Data: Flowsheet Rows        Most Recent Value   Intake Tab    Referral Department  Hospitalist   Unit at Time of Referral  Cardiac/Telemetry Unit   Palliative Care Primary Diagnosis  Nephrology   Date Notified  12/01/15   Palliative Care Type  New Palliative care   Reason for referral  Clarify Goals of Care, End of Life Care Assistance   Date of Admission  12/12/2015   Date first seen by Palliative Care  12/02/15   # of days Palliative referral response time  1 Day(s)   # of days IP prior to Palliative referral  6   Clinical Assessment    Palliative Performance Scale Score  10%   Pain Max last 24 hours  5   Dyspnea Max Last 24 Hours  7   Anxiety Min Last 24 Hours  7   Psychosocial & Spiritual Assessment    Palliative Care Outcomes    Patient/Family meeting held?  Yes   Who was at the meeting?  patient, daughter, husband, step son   Palliative Care Outcomes  Improved pain interventions, Improved non-pain symptom therapy, Provided end of life care assistance, Counseled regarding hospice, Provided psychosocial or spiritual support   Patient/Family wishes: Interventions discontinued/not started   Hemodialysis       Additional Data Reviewed: CBC    Component Value Date/Time   WBC 9.9 12/02/2015 0907   RBC 3.95 12/02/2015 0907   RBC 3.19* 11/26/2015 1030   HGB 12.2 12/02/2015 0907   HCT 38.8 12/02/2015 0907   PLT 312 12/02/2015 0907   MCV 98.2 12/02/2015 0907   MCH 30.9 12/02/2015 0907   MCHC 31.4 12/02/2015 0907   RDW 18.1* 12/02/2015 0907   LYMPHSABS 0.8 12/02/2015 0907   MONOABS 0.6 12/02/2015 0907   EOSABS 0.0 12/02/2015 0907   BASOSABS 0.0 12/02/2015 0907    CMP     Component Value Date/Time   NA 138 12/02/2015 0907   K 5.0 12/02/2015 0907   CL 93* 12/02/2015 0907   CO2 18* 12/02/2015 0907   GLUCOSE 191* 12/02/2015 0907   BUN 48* 12/02/2015 0907   CREATININE 4.77* 12/02/2015 0907   CALCIUM 10.6* 12/02/2015 0907   PROT 8.7* 12/02/2015 0907   ALBUMIN 3.3* 12/02/2015 0907   AST 40 12/02/2015 0907   ALT 20 12/02/2015 0907   ALKPHOS 76 12/02/2015 0907   BILITOT 2.1* 12/02/2015 0907   GFRNONAA 9* 12/02/2015 0907   GFRAA 10* 12/02/2015 0907       Problem List:  Patient Active Problem List   Diagnosis Date Noted  . End of life care   . Encounter for hospice care discussion   . Dyspnea   . Elevated troponin   . Chronic systolic CHF (congestive heart failure) (HCC)   . Lactic acidosis   . End-stage renal disease on hemodialysis (HCC)   . Chronic obstructive pulmonary disease (HCC)   . Anemia in chronic kidney disease   . Severe sepsis (HCC) 04-Dec-2015  . Acute respiratory failure (HCC) 12/09/2015  . COPD (chronic obstructive pulmonary disease) (HCC) Dec 04, 2015  .  Acute encephalopathy 11/26/2015  . FTT (failure to thrive) in adult   . Biliary colic   . Coronary artery disease involving coronary bypass graft of native heart with other forms of angina pectoris (HCC)   . Acute calculous cholecystitis 05/06/2015  . Acute on chronic systolic CHF (congestive heart failure) (HCC) 03/06/2015  . Bilateral leg edema   . Diabetes mellitus without complication (HCC)  03/04/2015  . SOB (shortness of breath)   . Pulmonary edema 01/22/2014  . NSTEMI (non-ST elevated myocardial infarction) (HCC) 01/01/2014  . Sacrum and coccyx fracture (HCC) 01/01/2014  . Altered mental state 01/01/2014  . Chronic systolic heart failure (HCC) 01/01/2014  . Fracture of proximal end of femur (HCC) 12/10/2013  . Palliative care encounter 11/15/2013  . Generalized pain 11/15/2013  . Acute on chronic combined systolic and diastolic heart failure, NYHA class 4 (HCC) 11/14/2013  . Severe mitral regurgitation 11/14/2013  . Acute respiratory failure with hypoxia (HCC) 11/11/2013  . Acute pulmonary edema (HCC) 11/11/2013  . ESRD on dialysis (HCC) 11/11/2013  . Obstructive chronic bronchitis without exacerbation (HCC) 11/11/2013  . Diabetes (HCC) 11/11/2013  . Cardiomyopathy, ischemic 11/11/2013  . Anemia of chronic disease 11/11/2013  . CCF (congestive cardiac failure) (HCC) 10/11/2013  . Closed fracture of part of neck of femur (HCC) 10/07/2013  . End stage kidney disease (HCC) 10/07/2013     Palliative Care Assessment & Plan    1.Code Status: DNR   2. Goals of Care/Additional Recommendations:  Limitations on Scope of Treatment: Full Comfort Care   3. Symptom Management:      1.Morpine drip and prn for pain / dyspnea - appears well controlled       2. Ativan scheduled and PRN for anxiety       3.  Robinul scheduled and PRN for secretions - appear well controlled   Prognosis: Hours - Days  6. Discharge Planning:  Anticipated Hospital Death   Care plan was discussed with Bedside RN and Palliative Med RN  Thank you for allowing the Palliative Medicine Team to assist in the care of this patient.   Time In: 7:40 Time Out: 7:55 Total Time 15 min Prolonged Time Billed no        Stephani Police, PA-C  12/03/2015, 7:51 AM  Please contact Palliative Medicine Team phone at [phone] for questions and concerns.

## 2015-12-03 NOTE — Discharge Instructions (Signed)
Hospice °Hospice is a service that is designed to provide people who are terminally ill and their families with medical, spiritual, and psychological support. Its aim is to improve your quality of life by keeping you as alert and comfortable as possible. Hospice is performed by a team of health care professionals and volunteers who: °· Help keep you comfortable. Hospice can be provided in your home or in a homelike setting. The hospice staff works with your family and friends to help meet your needs. You will enjoy the support of loved ones by receiving much of your basic care from family and friends. °· Provide pain relief and manage your symptoms. The staff supply all necessary medicines and equipment. °· Provide companionship when you are alone. °· Allow you and your family to rest. They may do light housekeeping, prepare meals, and run errands. °· Provide counseling. They will make sure your emotional, spiritual, and social needs and those of your family are being met. °· Provide spiritual care. Spiritual care is individualized to meet your needs and your family's needs. It may involve helping you look at what death means to you, say goodbye, or perform a specific religious ceremony or ritual. °Hospice teams often include: °· A nurse. °· A doctor. °· Social workers. °· Religious leaders (such as a chaplain). °· Trained volunteers. °WHEN SHOULD HOSPICE CARE BEGIN? °Most people who use hospice are believed to have fewer than 6 months to live. Your family and health care providers can help you decide when hospice services should begin. If your condition improves, you may discontinue the program. °WHAT SHOULD I CONSIDER BEFORE SELECTING A PROGRAM? °Most hospice programs are run by nonprofit, independent organizations. Some are affiliated with hospitals, nursing homes, or home health care agencies. Hospice programs can take place in the home or at a hospice center, hospital, or skilled nursing facility. When choosing  a hospice program, ask the following questions: °· What services are available to me? °· What services are offered to my loved ones? °· How involved are my loved ones? °· How involved is my health care provider? °· Who makes up the hospice care team? How are they trained or screened? °· How will my pain and symptoms be managed? °· If my circumstances change, can the services be provided in a different setting, such as my home or in the hospital? °· Is the program reviewed and licensed by the state or certified in some other way? °WHERE CAN I LEARN MORE ABOUT HOSPICE? °You can learn about existing hospice programs in your area from your health care providers. You can also read more about hospice online. The websites of the following organizations contain helpful information: °· The National Hospice and Palliative Care Organization (NHPCO). °· The Hospice Association of America (HAA). °· The Hospice Education Institute. °· The American Cancer Society (ACS). °· Hospice Net. °  °This information is not intended to replace advice given to you by your health care provider. Make sure you discuss any questions you have with your health care provider. °  °Document Released: 12/03/2003 Document Revised: 08/21/2013 Document Reviewed: 06/26/2013 °Elsevier Interactive Patient Education ©2016 [employer] ° °

## 2015-12-03 NOTE — Care Management Important Message (Signed)
Important Message  Patient Details  Name: MELANNIE WEED MRN: [id number] Date of Birth: 12-03-1950   Medicare Important Message Given:  This pt is actively dying, too unstable to transport to residential hospice facility which was family wishes, however pt is unable to d/c and transfer at this time.    Caileb Rhue, Annamarie Major, RN 12/03/2015, 4:40 PM

## 2015-12-03 NOTE — Progress Notes (Signed)
PT Cancellation Note  Patient Details Name: Kathleen [name] MRN: [id number] DOB: 04/10/1951   Cancelled Treatment:    Reason Eval/Treat Not Completed: Other (comment) Noted unfortunate decline in patient's medical status along with change in plan of care. Patient now comfort care. PT signing-off.  Berton Mount 12/03/2015, 7:19 AM  Charlsie Merles, PT [phone]

## 2015-12-04 NOTE — Progress Notes (Signed)
Pt expired at 0207. Death was anticipated and pt was comfort care. Death certificate completed and given to RN.

## 2015-12-04 NOTE — Progress Notes (Signed)
Patient extremities cool to touch. No respirations. No pulse. Patient expired at 0207. Verified with Christiana Fuchs, RN.  Notified spouse, Joanetta Humm. Notified on call K.Kirby, NP, Death certificate signed and placed with patient. [facility] notified. Referral number [id number]. Bed placement notified.  Body prepared and sent down to the morgue. Per spouse, will come pick up belongings at nurse's station tomorrow.   Morphine drip wasted with Narda Amber, RN.

## 2015-12-29 DEATH — deceased

## 2016-02-04 DIAGNOSIS — F411 Generalized anxiety disorder: Secondary | ICD-10-CM | POA: Insufficient documentation
